# Patient Record
Sex: Female | Born: 1970
Health system: Southern US, Community
[De-identification: ages and names within clinical notes are randomized; demographics above are authoritative.]

## PROBLEM LIST (undated history)

## (undated) DIAGNOSIS — G4730 Sleep apnea, unspecified: Secondary | ICD-10-CM

## (undated) DIAGNOSIS — H9190 Unspecified hearing loss, unspecified ear: Secondary | ICD-10-CM

## (undated) DIAGNOSIS — D649 Anemia, unspecified: Secondary | ICD-10-CM

## (undated) DIAGNOSIS — E119 Type 2 diabetes mellitus without complications: Secondary | ICD-10-CM

## (undated) DIAGNOSIS — F419 Anxiety disorder, unspecified: Secondary | ICD-10-CM

## (undated) DIAGNOSIS — R569 Unspecified convulsions: Secondary | ICD-10-CM

## (undated) DIAGNOSIS — Z9289 Personal history of other medical treatment: Secondary | ICD-10-CM

## (undated) DIAGNOSIS — Z8709 Personal history of other diseases of the respiratory system: Secondary | ICD-10-CM

## (undated) DIAGNOSIS — R0600 Dyspnea, unspecified: Secondary | ICD-10-CM

## (undated) DIAGNOSIS — F329 Major depressive disorder, single episode, unspecified: Secondary | ICD-10-CM

## (undated) DIAGNOSIS — F32A Depression, unspecified: Secondary | ICD-10-CM

## (undated) DIAGNOSIS — Z8639 Personal history of other endocrine, nutritional and metabolic disease: Secondary | ICD-10-CM

## (undated) DIAGNOSIS — Z8659 Personal history of other mental and behavioral disorders: Secondary | ICD-10-CM

## (undated) DIAGNOSIS — Z87898 Personal history of other specified conditions: Secondary | ICD-10-CM

## (undated) HISTORY — DX: Personal history of other mental and behavioral disorders: Z86.59

## (undated) HISTORY — DX: Anxiety disorder, unspecified: F41.9

## (undated) HISTORY — DX: Personal history of other endocrine, nutritional and metabolic disease: Z86.39

## (undated) HISTORY — PX: WISDOM TOOTH EXTRACTION: SHX21

## (undated) HISTORY — DX: Personal history of other specified conditions: Z87.898

## (undated) HISTORY — DX: Personal history of other diseases of the respiratory system: Z87.09

---

## 1999-07-13 ENCOUNTER — Inpatient Hospital Stay (HOSPITAL_COMMUNITY): Admission: AD | Admit: 1999-07-13 | Discharge: 1999-07-13 | Payer: Self-pay | Admitting: Obstetrics and Gynecology

## 1999-09-22 ENCOUNTER — Inpatient Hospital Stay (HOSPITAL_COMMUNITY): Admission: AD | Admit: 1999-09-22 | Discharge: 1999-09-25 | Payer: Self-pay | Admitting: Obstetrics & Gynecology

## 1999-10-19 ENCOUNTER — Other Ambulatory Visit: Admission: RE | Admit: 1999-10-19 | Discharge: 1999-10-19 | Payer: Self-pay | Admitting: Obstetrics and Gynecology

## 2000-10-30 ENCOUNTER — Other Ambulatory Visit: Admission: RE | Admit: 2000-10-30 | Discharge: 2000-10-30 | Payer: Self-pay | Admitting: Obstetrics and Gynecology

## 2001-04-29 ENCOUNTER — Encounter: Admission: RE | Admit: 2001-04-29 | Discharge: 2001-07-28 | Payer: Self-pay | Admitting: Family Medicine

## 2001-11-22 ENCOUNTER — Other Ambulatory Visit: Admission: RE | Admit: 2001-11-22 | Discharge: 2001-11-22 | Payer: Self-pay | Admitting: Obstetrics and Gynecology

## 2002-12-08 ENCOUNTER — Other Ambulatory Visit: Admission: RE | Admit: 2002-12-08 | Discharge: 2002-12-08 | Payer: Self-pay | Admitting: Obstetrics and Gynecology

## 2003-04-07 ENCOUNTER — Ambulatory Visit (HOSPITAL_COMMUNITY): Admission: RE | Admit: 2003-04-07 | Discharge: 2003-04-07 | Payer: Self-pay | Admitting: Obstetrics and Gynecology

## 2003-04-07 IMAGING — US US OB COMP +14 WK
1 series · 13 of 28 positions shown · non-contrast
Comparison: none

CLINICAL DATA: LMP [DATE].  G2 P1.  Assess fetal anatomy.  Possible borderline ventriculomegaly noted on outside ultrasound in [facility].

[Series 1: unknown · 0.20mm/px · 13 of 72 slices shown]
[im 3/72]
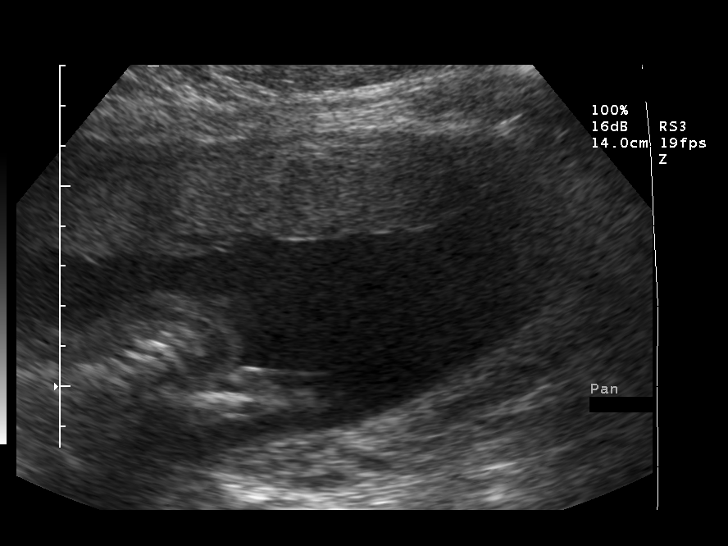
[im 8/72]
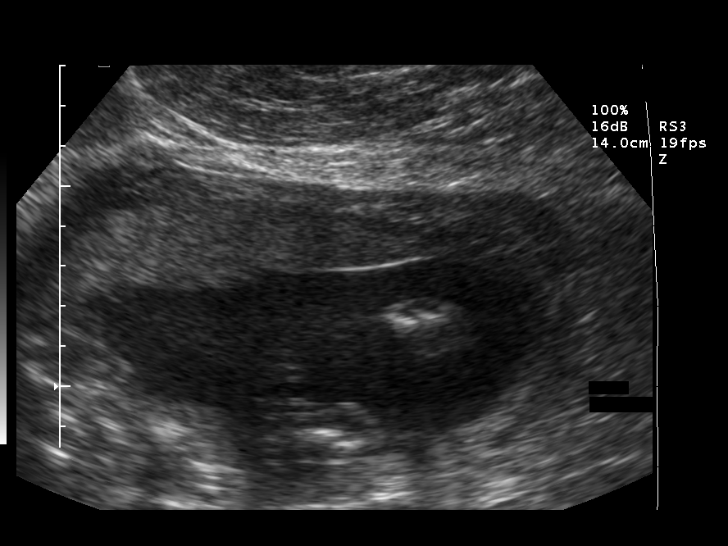
[im 14/72]
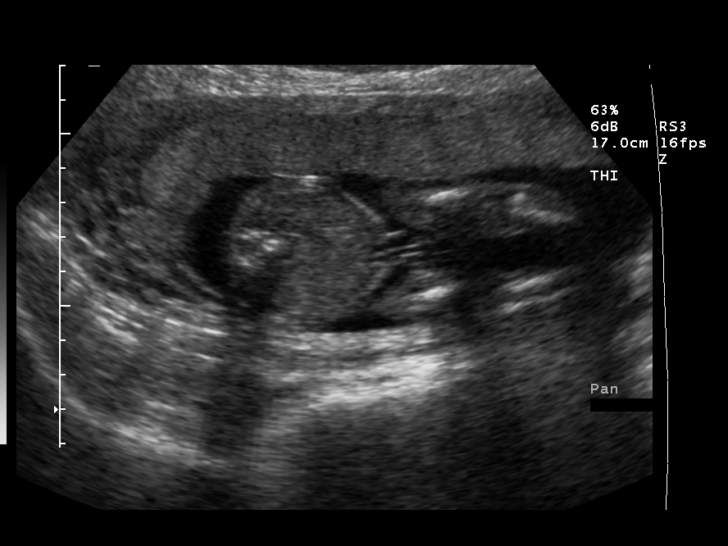
[im 19/72]
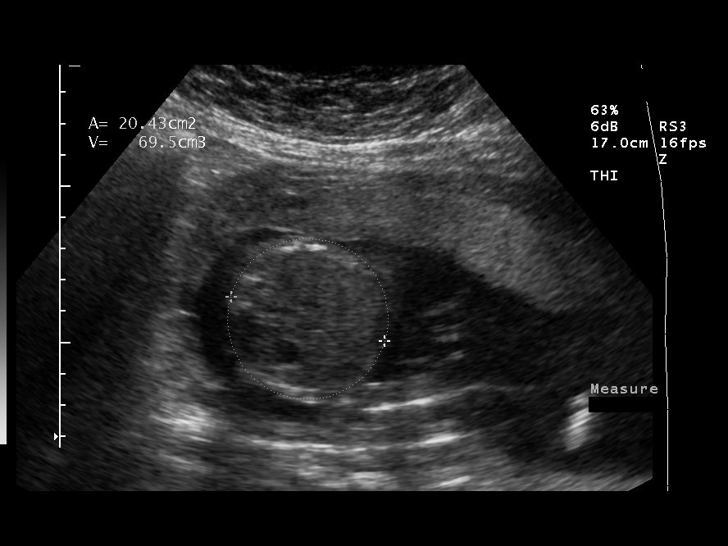
[im 24/72]
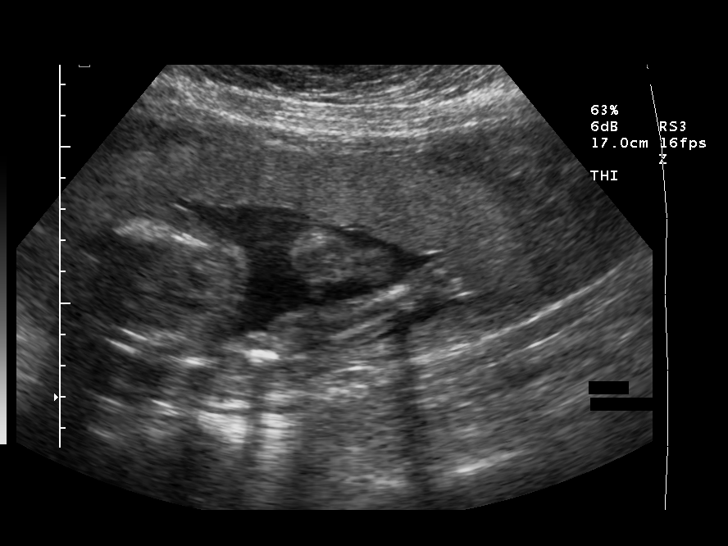
[im 29/72]
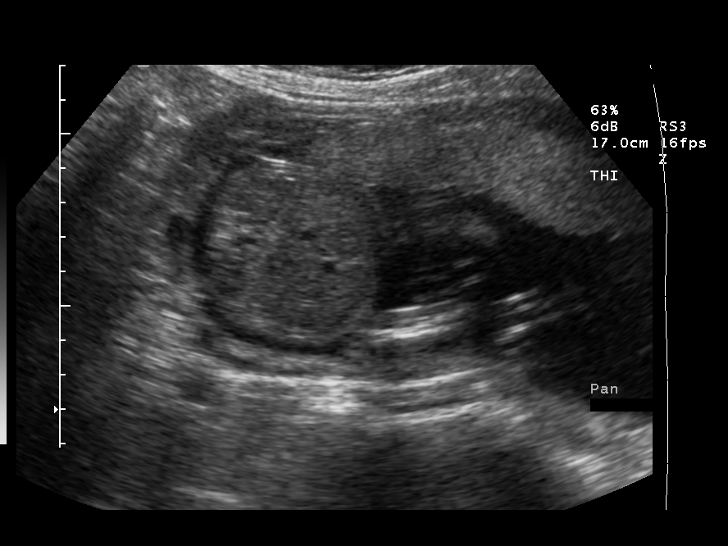
[im 37/72]
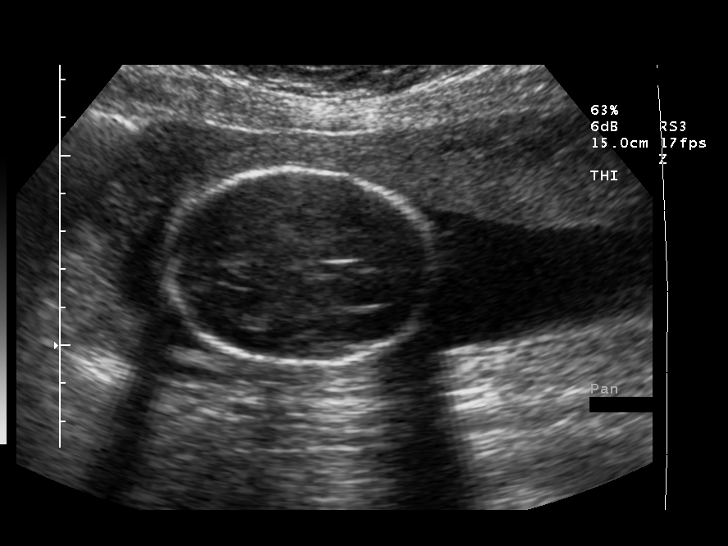
[im 43/72]
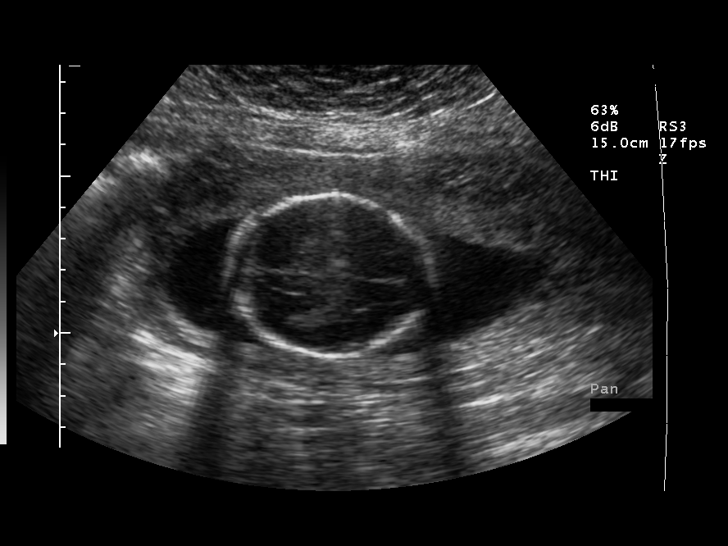
[im 48/72]
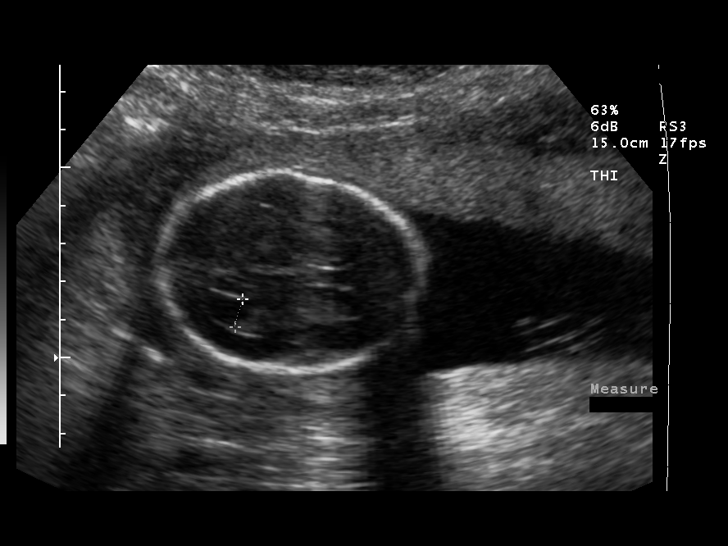
[im 53/72]
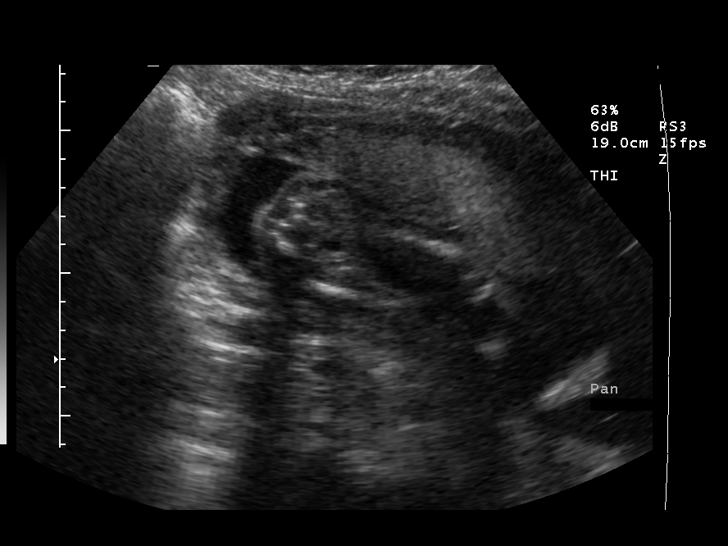
[im 58/72]
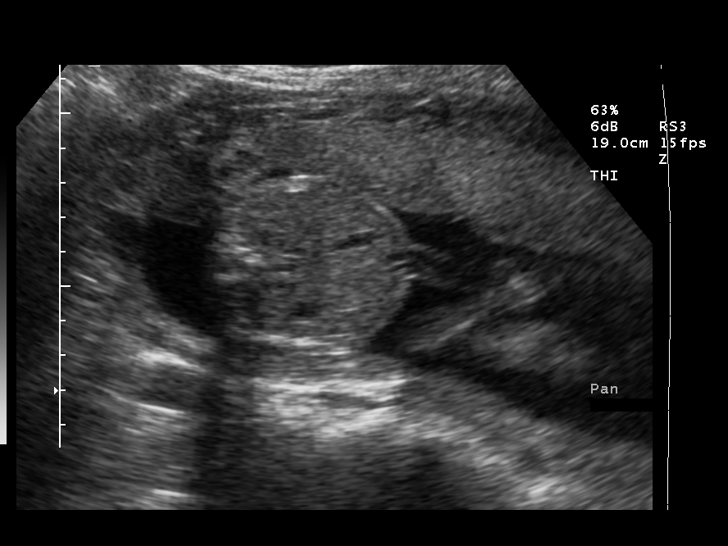
[im 64/72]
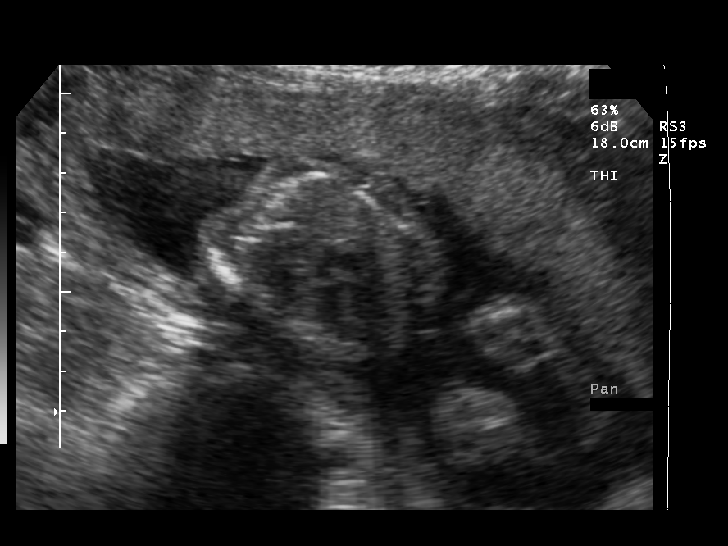
[im 69/72]
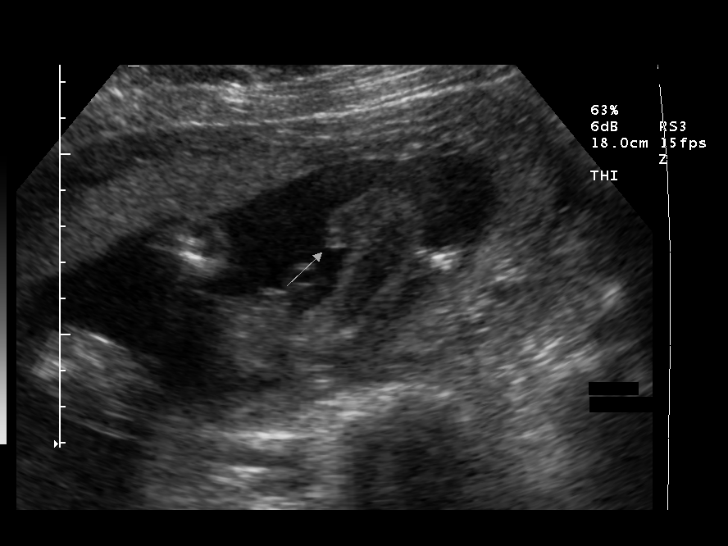

[13 of 28 positions shown; findings below may reference images not displayed]

OBSTETRICAL ULTRASOUND
Number of Fetuses:  1
Heart Rate:  158
Movement:  Yes
Breathing:  No  
Presentation:  Transverse with head on maternal right
Placental Location:  Anterior
Grade:  I
Previa:  No
Amniotic Fluid (Subjective):  Normal 
Amniotic Fluid (Objective):   5.0 cm Vertical pocket 

FETAL BIOMETRY
BPD:  5.1 cm   21 w 4 d
HC:  19.8 cm   22 w 0 d
AC:  16.2 cm   21 w 2 d
FL:  3.6 cm  21 w 3 d

MEAN GA:  21 w 4 d
GA BY LMP:  20 w 2 d (assigned)

FETAL ANATOMY
Lateral Ventricles:    Visualized (8 mm = normal) 
Thalami/CSP:      Visualized 
Posterior Fossa:  Visualized 
Nuchal Region:    N/A
Spine:      Visualized 
4 Chamber Heart on Left:      Visualized 
Stomach on Left:      Visualized 
3 Vessel Cord:    Visualized 
Cord Insertion site:    Visualized 
Kidneys:  Visualized 
Bladder:  Visualized 
Extremities:      Visualized 

ADDITIONAL ANATOMY VISUALIZED:  Upper lip, orbits, profile, diaphragm, 5th digit, aortic arch, and male genitalia

MATERNAL FINDINGS
Cervix: 5.0 cm Transabdominally
IMPRESSION: Single living intrauterine fetus in transverse lie with head on maternal right.  Patient?s assigned gestational age is 20 weeks 2 days by LMP dating.  She measures 21 weeks 4 days today indicating appropriate growth.
Cardiac outflow tracts, heel and ductal arch not seen today.  Otherwise, no anatomic abnormality noted.  The lateral ventricles measure 8 mm today which is within normal limits.

## 2003-04-07 IMAGING — US US OB COMP +14 WK
1 series · 5 of 5 positions shown · non-contrast
Comparison: none

CLINICAL DATA: LMP [DATE].  G2 P1.  Assess fetal anatomy.  Possible borderline ventriculomegaly noted on outside ultrasound in [facility].

[Series 1: unknown · 0.24mm/px · 5 of 5 slices shown]
[im 1/5]
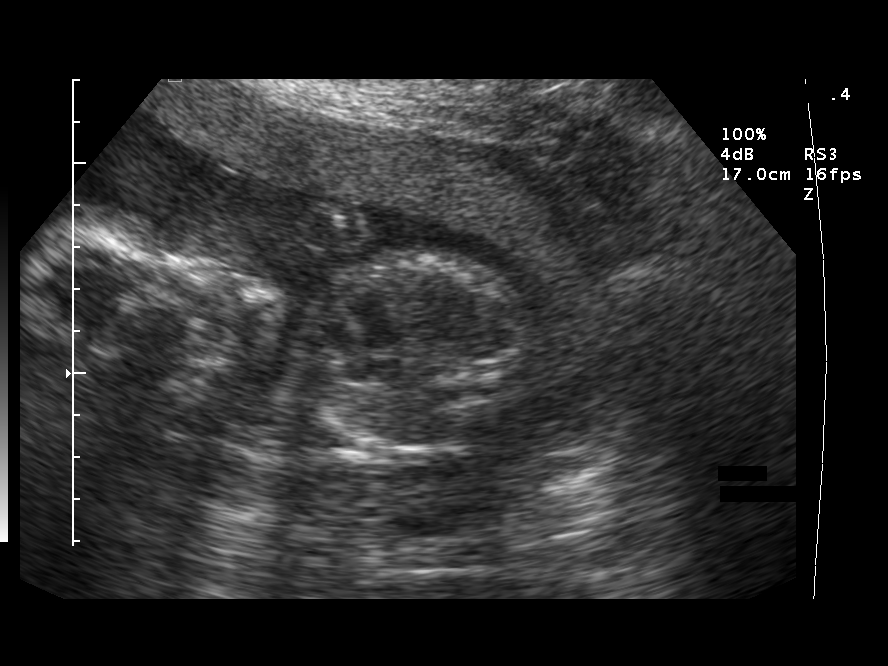
[im 2/5]
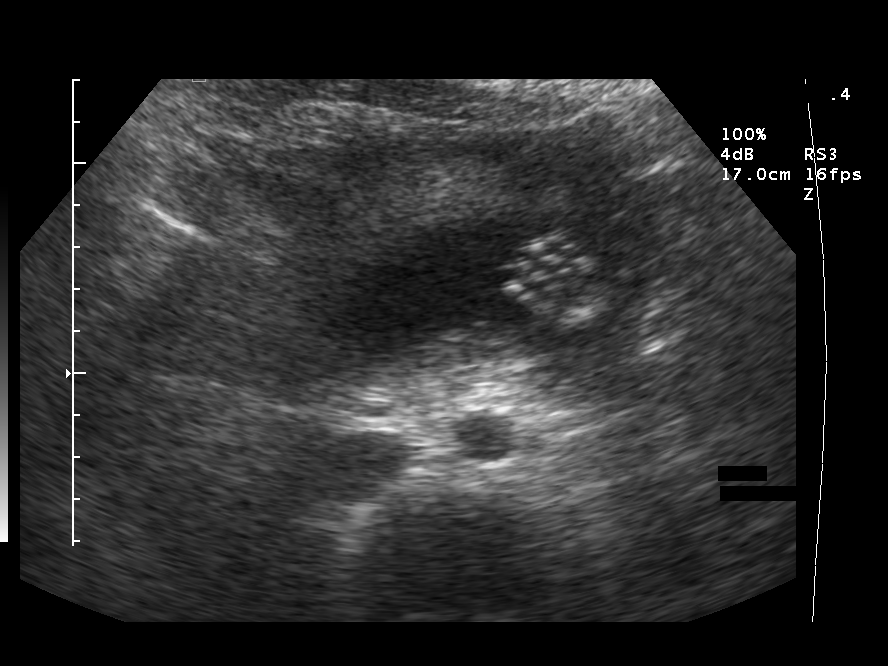
[im 3/5]
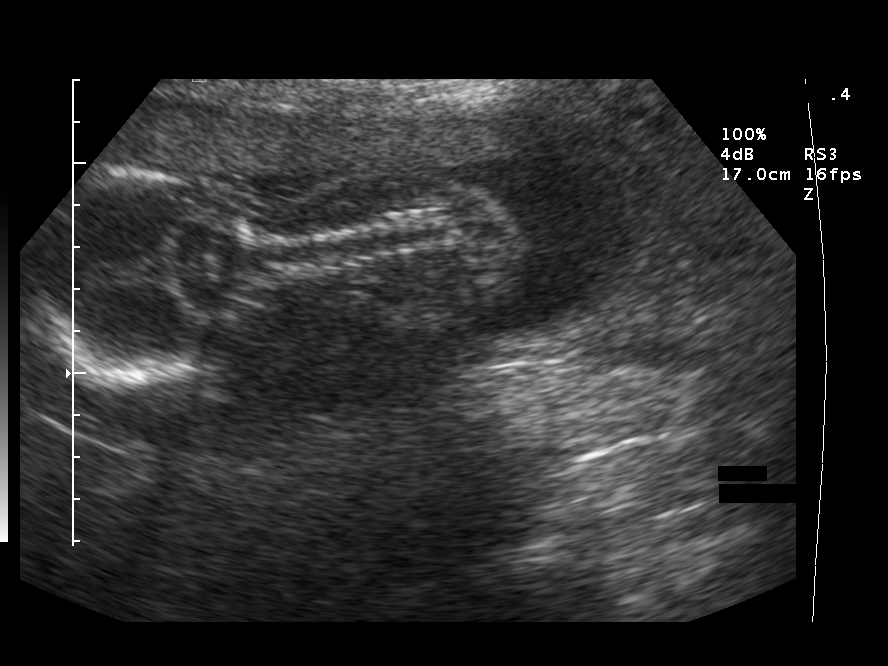
[im 4/5]
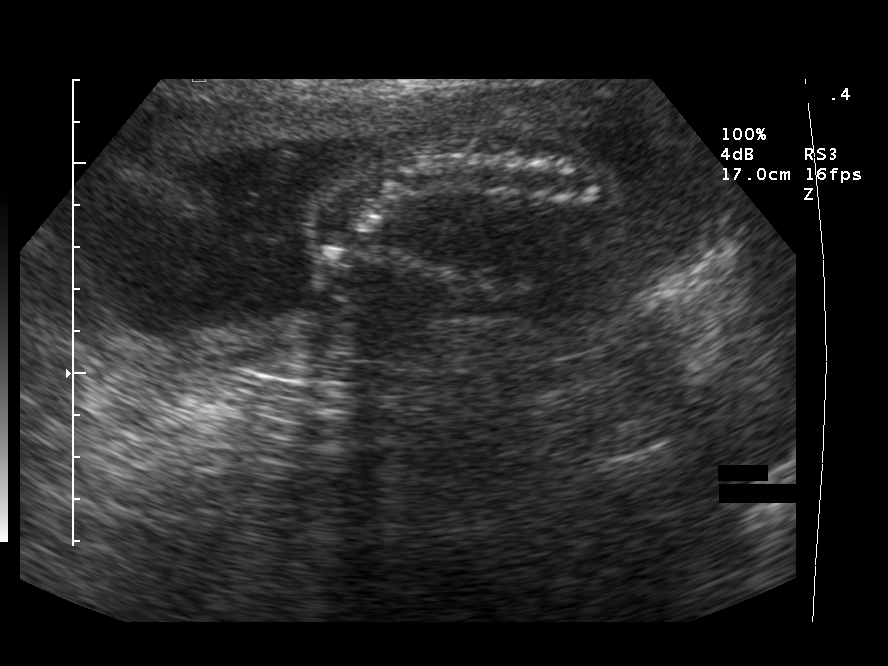
[im 5/5]
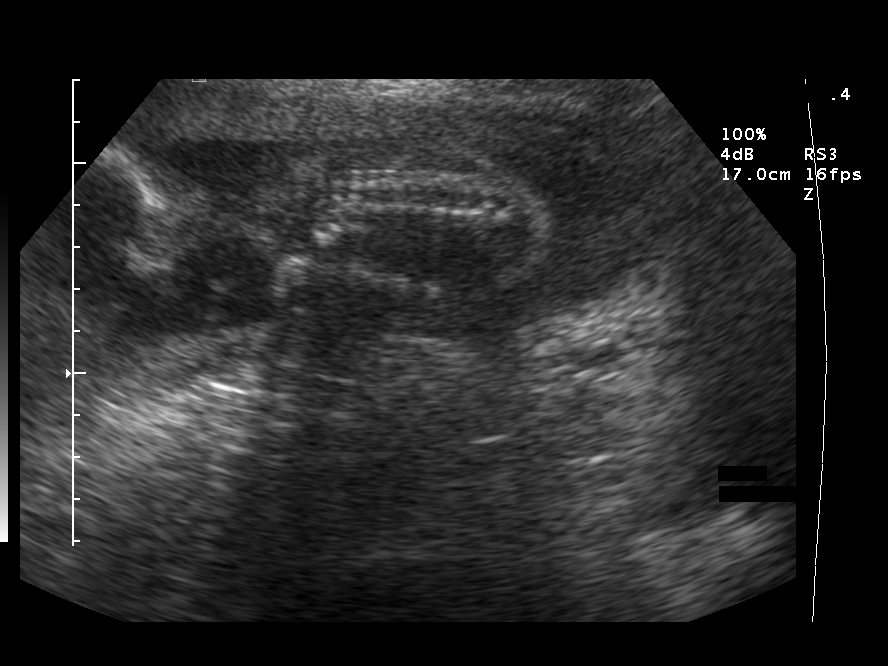

[5 of 5 positions shown; findings below may reference images not displayed]

OBSTETRICAL ULTRASOUND
Number of Fetuses:  1
Heart Rate:  158
Movement:  Yes
Breathing:  No  
Presentation:  Transverse with head on maternal right
Placental Location:  Anterior
Grade:  I
Previa:  No
Amniotic Fluid (Subjective):  Normal 
Amniotic Fluid (Objective):   5.0 cm Vertical pocket 

FETAL BIOMETRY
BPD:  5.1 cm   21 w 4 d
HC:  19.8 cm   22 w 0 d
AC:  16.2 cm   21 w 2 d
FL:  3.6 cm  21 w 3 d

MEAN GA:  21 w 4 d
GA BY LMP:  20 w 2 d (assigned)

FETAL ANATOMY
Lateral Ventricles:    Visualized (8 mm = normal) 
Thalami/CSP:      Visualized 
Posterior Fossa:  Visualized 
Nuchal Region:    N/A
Spine:      Visualized 
4 Chamber Heart on Left:      Visualized 
Stomach on Left:      Visualized 
3 Vessel Cord:    Visualized 
Cord Insertion site:    Visualized 
Kidneys:  Visualized 
Bladder:  Visualized 
Extremities:      Visualized 

ADDITIONAL ANATOMY VISUALIZED:  Upper lip, orbits, profile, diaphragm, 5th digit, aortic arch, and male genitalia

MATERNAL FINDINGS
Cervix: 5.0 cm Transabdominally
IMPRESSION: Single living intrauterine fetus in transverse lie with head on maternal right.  Patient?s assigned gestational age is 20 weeks 2 days by LMP dating.  She measures 21 weeks 4 days today indicating appropriate growth.
Cardiac outflow tracts, heel and ductal arch not seen today.  Otherwise, no anatomic abnormality noted.  The lateral ventricles measure 8 mm today which is within normal limits.

## 2003-06-02 ENCOUNTER — Ambulatory Visit (HOSPITAL_COMMUNITY): Admission: RE | Admit: 2003-06-02 | Discharge: 2003-06-02 | Payer: Self-pay | Admitting: Obstetrics and Gynecology

## 2003-07-30 ENCOUNTER — Emergency Department (HOSPITAL_COMMUNITY): Admission: EM | Admit: 2003-07-30 | Discharge: 2003-07-30 | Payer: Self-pay | Admitting: *Deleted

## 2003-08-14 ENCOUNTER — Inpatient Hospital Stay (HOSPITAL_COMMUNITY): Admission: RE | Admit: 2003-08-14 | Discharge: 2003-08-17 | Payer: Self-pay | Admitting: Obstetrics and Gynecology

## 2004-02-26 ENCOUNTER — Inpatient Hospital Stay (HOSPITAL_COMMUNITY): Admission: AD | Admit: 2004-02-26 | Discharge: 2004-02-26 | Payer: Self-pay | Admitting: Obstetrics & Gynecology

## 2004-04-14 ENCOUNTER — Other Ambulatory Visit: Admission: RE | Admit: 2004-04-14 | Discharge: 2004-04-14 | Payer: Self-pay | Admitting: Obstetrics and Gynecology

## 2004-08-10 ENCOUNTER — Emergency Department (HOSPITAL_COMMUNITY): Admission: EM | Admit: 2004-08-10 | Discharge: 2004-08-10 | Payer: Self-pay | Admitting: Family Medicine

## 2005-04-12 ENCOUNTER — Ambulatory Visit: Payer: Self-pay | Admitting: Family Medicine

## 2005-07-10 ENCOUNTER — Ambulatory Visit: Payer: Self-pay | Admitting: Family Medicine

## 2005-07-24 ENCOUNTER — Ambulatory Visit: Payer: Self-pay | Admitting: Family Medicine

## 2005-08-18 ENCOUNTER — Ambulatory Visit: Payer: Self-pay | Admitting: Family Medicine

## 2006-01-05 ENCOUNTER — Other Ambulatory Visit: Admission: RE | Admit: 2006-01-05 | Discharge: 2006-01-05 | Payer: Self-pay | Admitting: Family Medicine

## 2006-01-05 ENCOUNTER — Encounter: Payer: Self-pay | Admitting: Family Medicine

## 2006-01-05 ENCOUNTER — Ambulatory Visit: Payer: Self-pay | Admitting: Family Medicine

## 2006-01-15 ENCOUNTER — Ambulatory Visit: Payer: Self-pay | Admitting: Family Medicine

## 2006-01-24 ENCOUNTER — Ambulatory Visit: Payer: Self-pay | Admitting: Endocrinology

## 2006-02-16 ENCOUNTER — Ambulatory Visit: Payer: Self-pay | Admitting: Endocrinology

## 2006-03-26 ENCOUNTER — Ambulatory Visit: Payer: Self-pay | Admitting: Family Medicine

## 2006-05-04 ENCOUNTER — Ambulatory Visit: Payer: Self-pay | Admitting: Family Medicine

## 2006-06-20 ENCOUNTER — Ambulatory Visit: Payer: Self-pay | Admitting: Family Medicine

## 2006-11-15 ENCOUNTER — Encounter: Payer: Self-pay | Admitting: Endocrinology

## 2006-11-15 DIAGNOSIS — E162 Hypoglycemia, unspecified: Secondary | ICD-10-CM

## 2007-04-15 ENCOUNTER — Ambulatory Visit: Payer: Self-pay | Admitting: Internal Medicine

## 2007-05-13 ENCOUNTER — Encounter: Payer: Self-pay | Admitting: Family Medicine

## 2007-06-12 ENCOUNTER — Emergency Department (HOSPITAL_COMMUNITY): Admission: EM | Admit: 2007-06-12 | Discharge: 2007-06-12 | Payer: Self-pay | Admitting: Emergency Medicine

## 2007-07-31 ENCOUNTER — Ambulatory Visit: Payer: Self-pay | Admitting: Family Medicine

## 2007-07-31 LAB — CONVERTED CEMR LAB: Rapid Strep: NEGATIVE

## 2008-02-05 ENCOUNTER — Ambulatory Visit: Payer: Self-pay | Admitting: Family Medicine

## 2008-02-05 LAB — CONVERTED CEMR LAB: Rapid Strep: NEGATIVE

## 2008-02-07 ENCOUNTER — Ambulatory Visit: Payer: Self-pay | Admitting: Family Medicine

## 2008-02-07 ENCOUNTER — Encounter: Payer: Self-pay | Admitting: Family Medicine

## 2008-02-07 ENCOUNTER — Other Ambulatory Visit: Admission: RE | Admit: 2008-02-07 | Discharge: 2008-02-07 | Payer: Self-pay | Admitting: Family Medicine

## 2008-02-07 DIAGNOSIS — J309 Allergic rhinitis, unspecified: Secondary | ICD-10-CM | POA: Insufficient documentation

## 2008-02-07 DIAGNOSIS — F419 Anxiety disorder, unspecified: Secondary | ICD-10-CM | POA: Insufficient documentation

## 2008-03-16 ENCOUNTER — Encounter: Admission: RE | Admit: 2008-03-16 | Discharge: 2008-03-16 | Payer: Self-pay | Admitting: Family Medicine

## 2008-03-16 IMAGING — MG MM SCREEN MAMMOGRAM BILATERAL
4 series · 4 of 4 positions shown · non-contrast
Comparison: none

DG SCREEN MAMMOGRAM BILATERAL
Bilateral CC and MLO view(s) were taken.
Technologist: SCHAEFER(SCHAEFER)

DIGITAL SCREENING MAMMOGRAM WITH CAD:
There are scattered fibroglandular densities.  A possible mass is noted in the left breast. 
Possible asymmetry is noted in the right breast.  Spot compression views and possibly sonography 
are recommended for further evaluation.

[R CC]
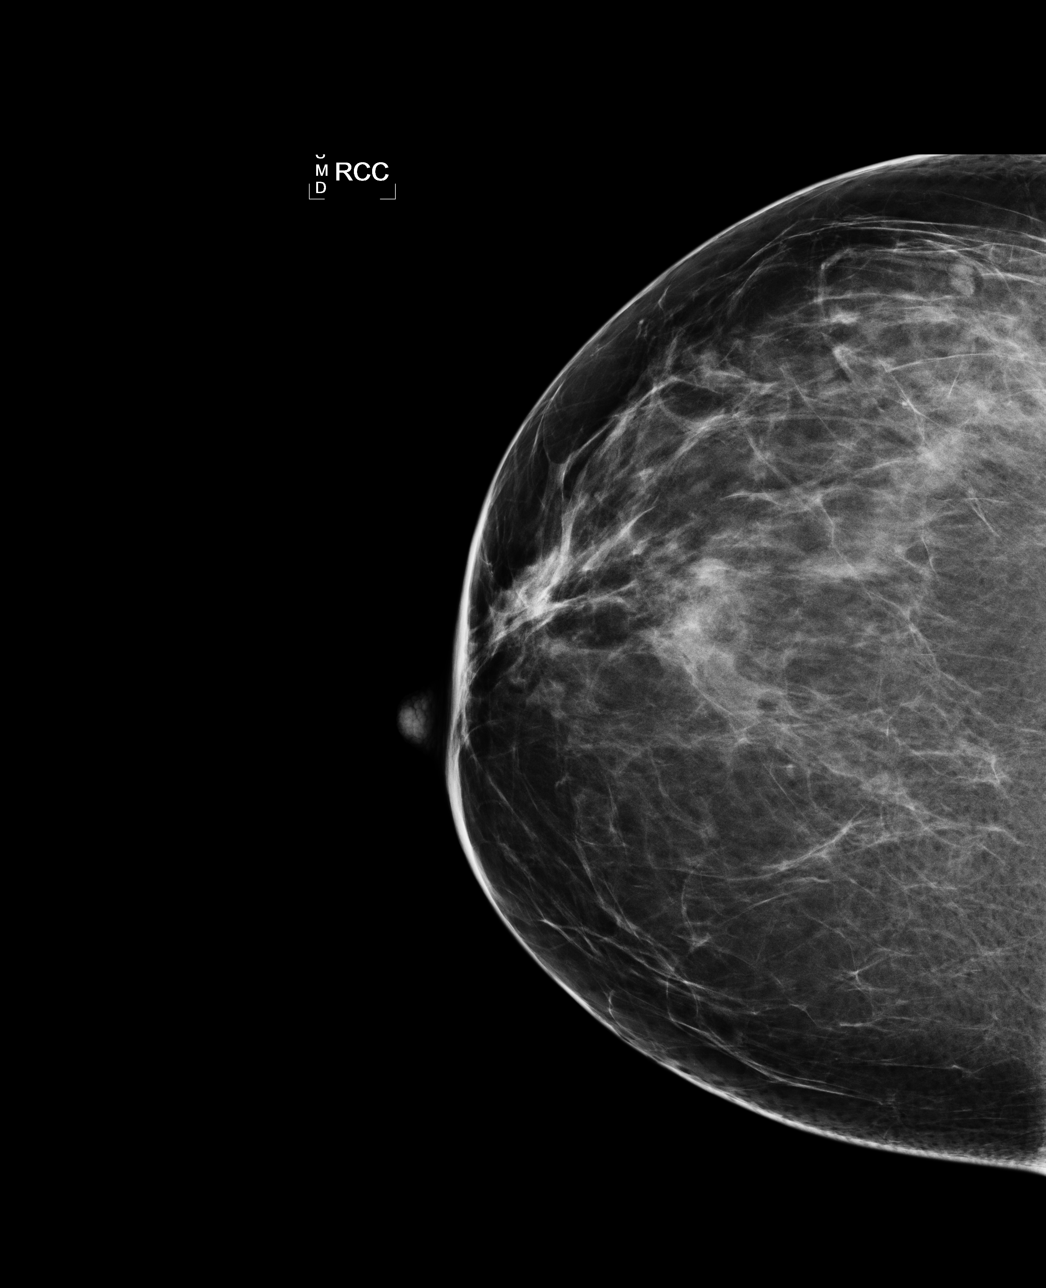

[L CC]
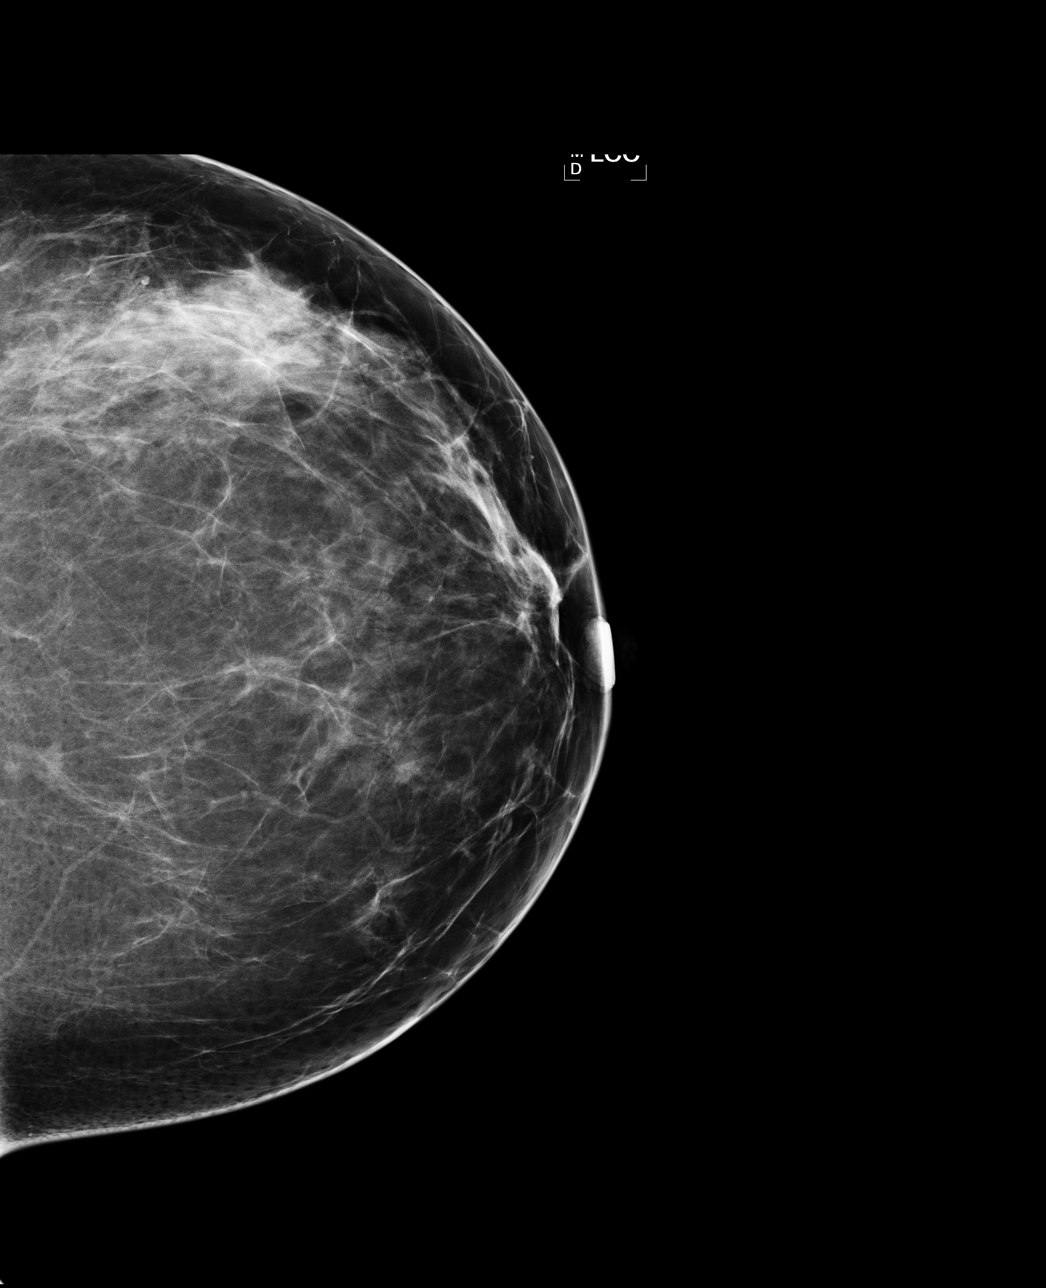

[L MLO]
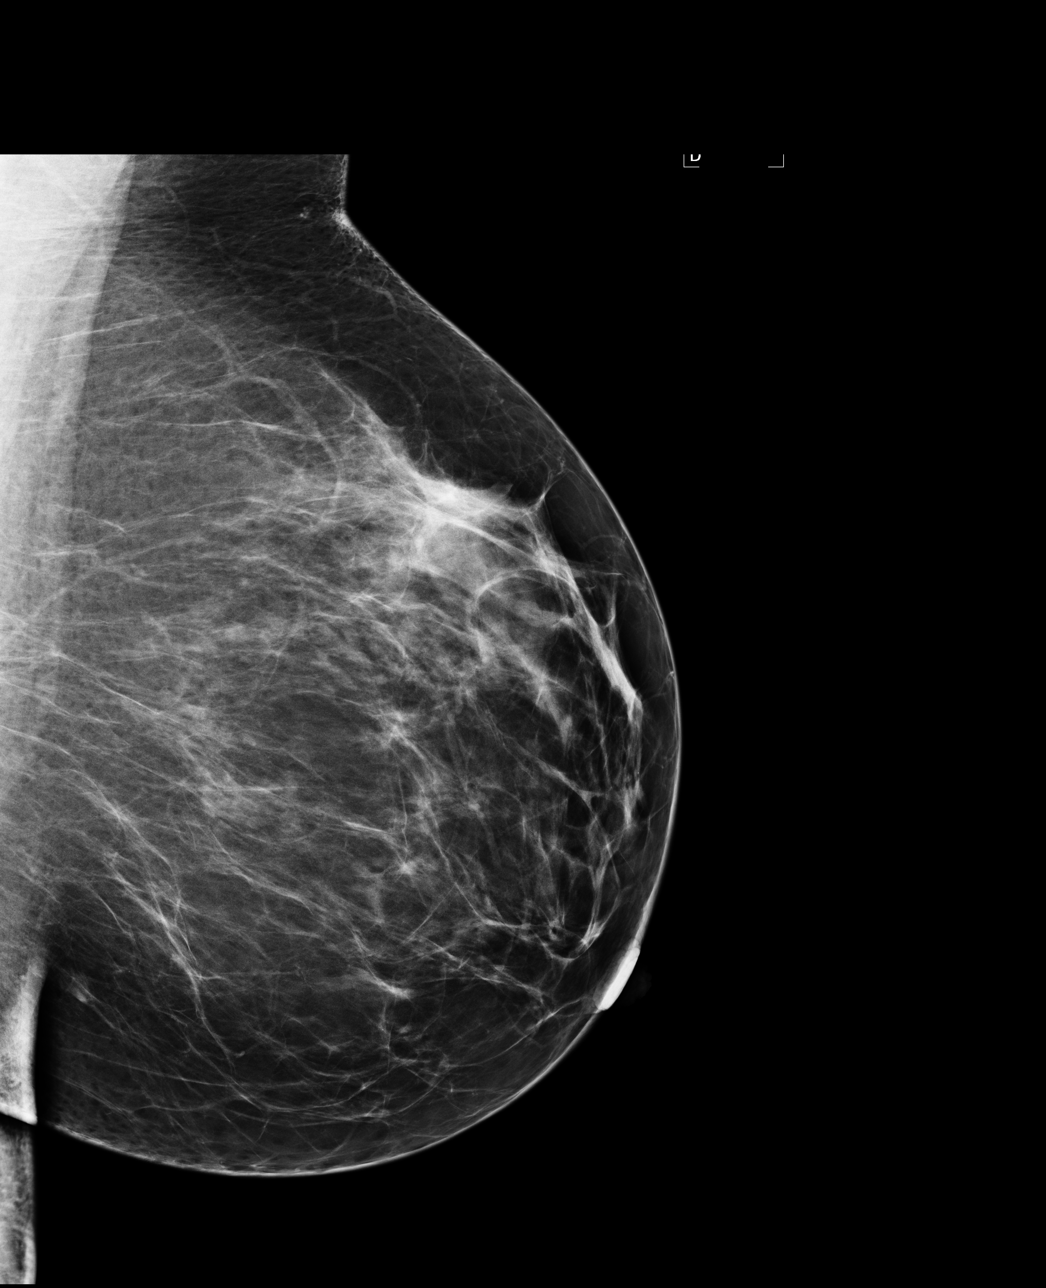

[R MLO]
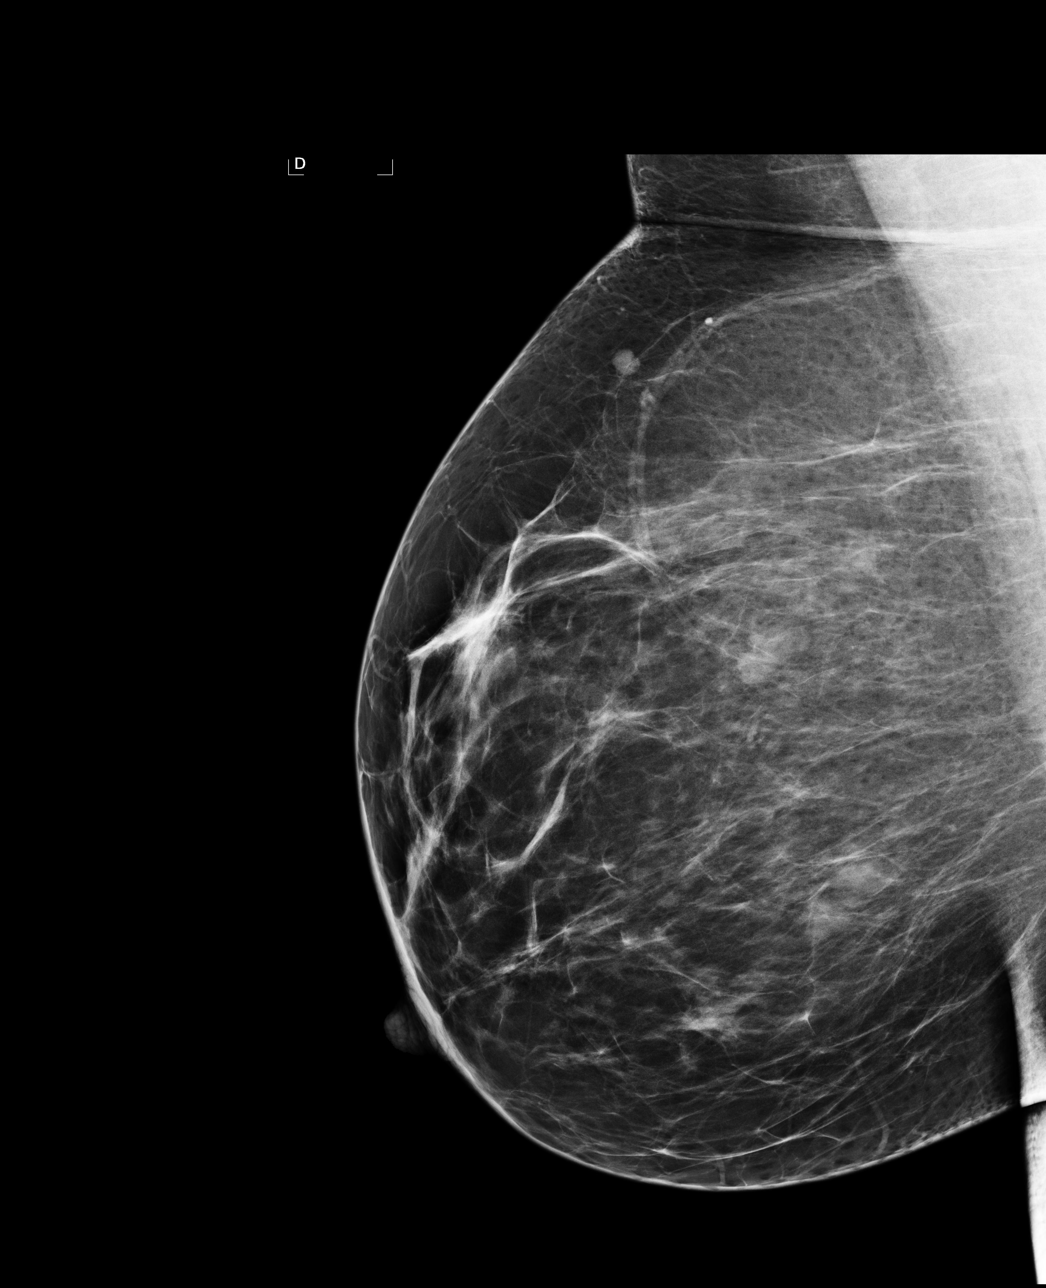

[4 of 4 positions shown; findings below may reference images not displayed]

IMPRESSION: Possible mass, left breast and possible asymmetry right breast.  Additional evaluation is 
indicated.  The patient will be contacted for additional studies and a supplementary report will 
follow.

ASSESSMENT: Need additional imaging evaluation and/or prior mammograms for comparison - BI-RADS 0

Further imaging of both breasts.
ANALYZED BY COMPUTER AIDED DETECTION. , THIS PROCEDURE WAS A DIGITAL MAMMOGRAM.

## 2008-03-18 DIAGNOSIS — R928 Other abnormal and inconclusive findings on diagnostic imaging of breast: Secondary | ICD-10-CM | POA: Insufficient documentation

## 2008-03-23 ENCOUNTER — Encounter: Admission: RE | Admit: 2008-03-23 | Discharge: 2008-03-23 | Payer: Self-pay | Admitting: Family Medicine

## 2008-04-07 ENCOUNTER — Ambulatory Visit: Payer: Self-pay | Admitting: Family Medicine

## 2008-04-07 DIAGNOSIS — E781 Pure hyperglyceridemia: Secondary | ICD-10-CM | POA: Insufficient documentation

## 2008-04-07 DIAGNOSIS — H9190 Unspecified hearing loss, unspecified ear: Secondary | ICD-10-CM | POA: Insufficient documentation

## 2008-05-18 ENCOUNTER — Telehealth (INDEPENDENT_AMBULATORY_CARE_PROVIDER_SITE_OTHER): Payer: Self-pay | Admitting: *Deleted

## 2008-09-17 ENCOUNTER — Encounter: Admission: RE | Admit: 2008-09-17 | Discharge: 2008-09-17 | Payer: Self-pay | Admitting: Family Medicine

## 2008-09-22 ENCOUNTER — Encounter (INDEPENDENT_AMBULATORY_CARE_PROVIDER_SITE_OTHER): Payer: Self-pay | Admitting: *Deleted

## 2008-10-07 ENCOUNTER — Ambulatory Visit: Payer: Self-pay | Admitting: Family Medicine

## 2009-02-17 ENCOUNTER — Telehealth: Payer: Self-pay | Admitting: Family Medicine

## 2009-03-09 ENCOUNTER — Ambulatory Visit: Payer: Self-pay | Admitting: Family Medicine

## 2009-04-06 ENCOUNTER — Ambulatory Visit: Payer: Self-pay | Admitting: Family Medicine

## 2009-12-10 ENCOUNTER — Telehealth: Payer: Self-pay | Admitting: Internal Medicine

## 2010-01-11 ENCOUNTER — Encounter: Payer: Self-pay | Admitting: Family Medicine

## 2010-03-24 ENCOUNTER — Ambulatory Visit: Admit: 2010-03-24 | Payer: Self-pay | Admitting: Family Medicine

## 2010-03-29 ENCOUNTER — Ambulatory Visit
Admission: RE | Admit: 2010-03-29 | Discharge: 2010-03-29 | Payer: Self-pay | Source: Home / Self Care | Attending: Family Medicine | Admitting: Family Medicine

## 2010-04-21 NOTE — Progress Notes (Signed)
Summary: glyset  Phone Note Refill Request Message from:  Patient on December 10, 2009 10:06 AM  Refills Requested: Medication #1:  GLYSET 25 MG TABS 1 and 1/2 tabs by mouth once daily [facility]ses [facility]   Initial call taken by: Melody Comas,  December 10, 2009 10:07 AM    Prescriptions: GLYSET 25 MG TABS (MIGLITOL) 1 and 1/2 tabs by mouth once daily  #45 x 04   Entered by:   Lowella Petties CMA   Authorized by:   Cindee Salt MD   Signed by:   Lowella Petties CMA on 12/10/2009   Method used:   Electronically to        [facility] (retail)       [address]       [place], [place]  [address]       Ph: [phone] or [phone]       Fax: [phone]   RxID:   [id number]

## 2010-04-21 NOTE — Assessment & Plan Note (Signed)
Summary: F/U/alc   Vital Signs:  Patient profile:   40 year old female Height:      66.5 inches Weight:      241.50 pounds BMI:     38.53 Temp:     99.9 degrees F oral Pulse rate:   84 / minute Pulse rhythm:   regular BP sitting:   118 / 82  (left arm) Cuff size:   large  Vitals Entered By: Lewanda Rife LPN (March 29, 2010 4:16 PM) CC: follow-up visit and non productive cough   History of Present Illness: here to f/u for anx and chol and also with non prod cough  is working and caring for kids  husb is traveling a lot   her anxiety is still much better on lexapro can even deal with her husband traveling a lot   no more low sugar spells  is getting some exercise - 30 min per day 4 days per week  bike and walking     wt is up  8lb   her labs from Dr Evlyn Kanner are overall better and trig 207   is still checking sugars -- once per day at different times -- usually runs 118  not fasting  last AIC was 5.7      Allergies: 1)  ! Pcn  Past History:  Past Medical History: Last updated: 04/07/2008 hypoglycemia (syncope) depression/ anx  all rhinitis obesity high trig, low HDL  endo- Dr Evlyn Kanner ENT  Past Surgical History: Last updated: 11/15/2006 Caesarean section  Family History: Last updated: 02/07/2008 father DM Pgrandparents DM GF with prostate cancer   Social History: Last updated: 02/07/2008 non smoker  no alcohol   Review of Systems General:  Denies fatigue, fever, loss of appetite, and malaise. Eyes:  Denies blurring and eye irritation. CV:  Denies chest pain or discomfort, palpitations, and shortness of breath with exertion. Resp:  Denies cough and shortness of breath. GI:  Denies indigestion and nausea. MS:  Denies muscle aches and cramps. Derm:  Denies itching, lesion(s), poor wound healing, and rash. Psych:  Denies anxiety and depression. Endo:  Denies cold intolerance, excessive thirst, excessive urination, and heat intolerance. Heme:   Denies abnormal bruising and bleeding.   Impression & Recommendations:  Problem # 1:  ANXIETY (ICD-300.00) Assessment Improved  much improved with lexapro  continue good lifestyle habits- disc exercise  Her updated medication list for this problem includes:    Lexapro 20 Mg Tabs (Escitalopram oxalate) .Marland Kitchen... Take 1 tablet by mouth once a day  Orders: Prescription Created Electronically [phone])  Problem # 2:  HYPOGLYCEMIA, REACTIVE (ICD-251.2) Assessment: Improved  pt will continue endo f/u and good eating habits  gradually tolerating exercise better   Orders: Prescription Created Electronically [phone])  Complete Medication List: 1)  Onetouch Test Strp (Glucose blood) .... Check blood sugar once daily and as needed 2)  Glyset 25 Mg Tabs (Miglitol) .Marland Kitchen.. 1 by mouth two times a day 3)  Lovaza 1 Gm Caps (Omega-3-acid ethyl esters) .... Four tabs by mouth daily 4)  Lexapro 20 Mg Tabs (Escitalopram oxalate) .... Take 1 tablet by mouth once a day 5)  Trilipix 135 Mg Cpdr (Choline fenofibrate) .Marland Kitchen.. 1 by mouth once daily  Patient Instructions: 1)  think about getting  back to weight watchers  2)  up exercise to 5 days per week if you can  3)  no change in medicines  4)  check sugar MWF am fasting  5)  T, TH, sat ,  sun  check 2 hours after a meal  6)  also check sugar any time you feel like it is low and after exercise  7)  no change in medicine  Prescriptions: TRILIPIX 135 MG CPDR (CHOLINE FENOFIBRATE) 1 by mouth once daily  #30 x 11   Entered and Authorized by:   Judith Part MD   Signed by:   Judith Part MD on 03/29/2010   Method used:   [facility]        [facility] (retail)       [address]       [place], [place]  [address]       Ph: [phone] or [phone]       Fax: [phone]   RxID:   [id number] LEXAPRO 20 MG TABS (ESCITALOPRAM OXALATE) Take 1 tablet by mouth once a day  #30 x 11   Entered and Authorized by:   Judith Part MD   Signed by:   Judith Part MD on 03/29/2010   Method used:   Elect[facility]ically to        [facility] (retail)       [address]       [place], [place]  [address]       Ph: [phone] or [phone]       Fax: [phone]   RxID:   [id number] LOVAZA 1 GM CAPS (OMEGA-3-ACID ETHYL ESTERS) four tabs by mouth daily  #120 x 11   Entered and Authorized by:   Judith Part MD   Signed by:   Judith Part MD on 03/29/2010   Method used:   Electronically to        [facility] (retail)       [address]       [place], [place]  [address]       Ph: [phone] or [phone]       Fax: [phone]   RxID:   [id number] GLYSET 25 MG TABS (MIGLITOL) 1 by mouth two times a day  #60 x 11   Entered and Authorized by:   Judith Part MD   Signed by:   Judith Part MD on 03/29/2010   Method used:   Electronically to        [facility] (retail)       [address]       [place], [place]  [address]       Ph: [phone] or [phone]       Fax: [phone]   RxID:   [id number] ONETOUCH TEST   STRP (GLUCOSE BLOOD) Check blood sugar once daily and as needed  #100 x 3   Entered and Authorized by:   Judith Part MD   Signed by:   Judith Part MD on 03/29/2010   Method used:   [facility]        [facility] ([facility]ail)       [address]       [place], [place]  [address]       Ph: [phone] or [phone]       Fax: [phone]   RxID:   [id number]    Orders Added: 1)  Est. Patient Level III [34196] 2)  Prescription Created Electronically [phone]    Current Allergies (reviewed today): ! PCN

## 2010-04-21 NOTE — Assessment & Plan Note (Signed)
Summary: CONGESTION,CHILLS/CLE   Vital Signs:  Patient profile:   40 year old female Weight:      233 pounds Temp:     98.4 degrees F oral Pulse rate:   64 / minute Pulse rhythm:   regular BP sitting:   112 / 80  (left arm) Cuff size:   regular  Vitals Entered By: Lowella Petties CMA (April 06, 2009 12:24 PM) CC: Congestion and cough   History of Present Illness: symptoms started 3 days ago -- has been in bed  has not had fever  son and husband have the flu =tested positive   is having chills and aches -- but taken temp and not high   congestion and cough  some headaches -- all over head and pounding  neck is sore and aches all over  no sore throat  no appetite or energy  cough is dry and hacking  L ear hurts a little   no n/v/d   is not taking any meds over the counter     Allergies: 1)  ! Pcn  Past History:  Past Medical History: Last updated: 04/07/2008 hypoglycemia (syncope) depression/ anx  all rhinitis obesity high trig, low HDL  endo- Dr Evlyn Kanner ENT  Past Surgical History: Last updated: 11/15/2006 Caesarean section  Family History: Last updated: 02/07/2008 father DM Pgrandparents DM GF with prostate cancer   Social History: Last updated: 02/07/2008 non smoker  no alcohol   Review of Systems General:  Complains of chills, fatigue, loss of appetite, and malaise. Eyes:  Complains of eye irritation; denies discharge. ENT:  Complains of earache, nasal congestion, and postnasal drainage; denies ear discharge, sinus pressure, and sore throat. CV:  Denies chest pain or discomfort and palpitations. Resp:  Complains of cough; denies pleuritic, shortness of breath, sputum productive, and wheezing. GI:  Denies diarrhea, nausea, and vomiting. Derm:  Denies rash.  Physical Exam  General:  overwt/ fatigued appearing  Head:  normocephalic, atraumatic, and no abnormalities observed.  no sinus tenderness  Eyes:  vision grossly intact, pupils  equal, pupils round, pupils reactive to light, and no injection.   Ears:  R ear normal and L ear normal.   Nose:  nares are congested and injected with clear rhinorrhea  Mouth:  pharynx pink and moist, no erythema, and no exudates.   Neck:  No deformities, masses, or tenderness noted. supple and nl rom  Chest Wall:  No deformities, masses, or tenderness noted. Lungs:  Normal respiratory effort, chest expands symmetrically. Lungs are clear to auscultation, no crackles or wheezes. Heart:  Normal rate and regular rhythm. S1 and S2 normal without gallop, murmur, click, rub or other extra sounds. Abdomen:  soft and non-tender.   Skin:  Intact without suspicious lesions or rashes Cervical Nodes:  No lymphadenopathy noted Psych:  normal affect, talkative and pleasant    Impression & Recommendations:  Problem # 1:  INFLUENZA, WITH RESPIRATORY SYMPTOMS (ICD-487.1) Assessment New with chills/ aches - and close family exp is out of window for tamiflu- 3 1/2 days  recommend sympt care- see pt instructions   guifen- cod cough med/tylenol as needed  update if worse or not starting to imp 4-5 d  Complete Medication List: 1)  Onetouch Test Strp (Glucose blood) .... Check blood sugar daily as directed 2)  Glyset 25 Mg Tabs (Miglitol) .Marland Kitchen.. 1 and 1/2 tabs by mouth once daily 3)  Lovaza 1 Gm Caps (Omega-3-acid ethyl esters) .... Four tabs by mouth daily 4)  Lexapro  20 Mg Tabs (Escitalopram oxalate) .... Take 1 tablet by mouth once a day 5)  Triplix 135 Mg  .... One tab by mouth  every morning 6)  Guaifenesin-codeine 100-10 Mg/42ml Liqd (Guaifenesin-codeine) .Marland Kitchen.. 1-2 teaspoons up to every 4-6 hours as needed cough - watch for sedation  Patient Instructions: 1)  drink lots of fluids and rest  2)  tylenol every 4 hours (2 ES) for aches and chills  3)  take cough medicine with caution of sedation 4)  update me if not starting to improve in3-4 days or if worse  Prescriptions: GUAIFENESIN-CODEINE 100-10  MG/5ML LIQD (GUAIFENESIN-CODEINE) 1-2 teaspoons up to every 4-6 hours as needed cough - watch for sedation  #120cc x 0   Entered and Authorized by:   Judith Part MD   Signed by:   Judith Part MD on 04/06/2009   Method used:   Print then Give to Patient   RxID:   [id number]   Prior Medications (reviewed today): ONETOUCH TEST   STRP (GLUCOSE BLOOD) Check blood sugar daily as directed GLYSET 25 MG TABS (MIGLITOL) 1 and 1/2 tabs by mouth once daily LOVAZA 1 GM CAPS (OMEGA-3-ACID ETHYL ESTERS) four tabs by mouth daily LEXAPRO 20 MG TABS (ESCITALOPRAM OXALATE) Take 1 tablet by mouth once a day TRIPLIX 135 MG () one tab by mouth  every morning Current Allergies: ! PCN

## 2010-08-05 NOTE — Op Note (Signed)
[facility]  Patient:    Gail [name], Gail [name]                      MRN: [id number] Proc. Date: 09/22/99 Adm. Date:  [id number] Attending:  Conley Simmonds A                           Operative Report  PREOPERATIVE DIAGNOSES:       1. Intrauterine pregnancy at 40+ weeks                                  gestation.                               2. Fetal macrosomia.  POSTOPERATIVE DIAGNOSES:      1. Intrauterine pregnancy at 40+ weeks                                  gestation.                               2. Fetal macrosomia.                               3. Meconium stained amniotic fluid.  OPERATION:                    Primary low segment transverse cesarean section                               with vacuum assisted delivery of the vertex.  SURGEON:                      Brook A. Edward Jolly, M.D.  ASSISTANT:                    Gerlene Burdock D. Arlyce Dice, M.D.  ANESTHESIA:                   Spinal.  ESTIMATED BLOOD LOSS:         700 cc.  IV FLUIDS:                    2500 ringers lactate  URINE OUTPUT:                 150 cc.  COMPLICATIONS:                None.  INDICATIONS: The patient is a 40 year old, gravida 1, para 0, at 40+[redacted] weeks gestation, who by obstetric ultrasound had a fetus with an estimated weight of 3954 g on September 07, 1999. The patient presented to the office for her routine visit on September 22, 1999, at which time she continued to be measuring size greater than dates. The patient reported decreased fetal movement over the last day. Her cervix was checked and found to be closed, long, and thick with the presenting part high in the pelvis. A nonstress test was noted to be nonreactive, and a biophysical profile documented a score of 4/8. The amniotic fluid was 16 cm.  The patient was sent over to the [facility] for a contractions stress test, which was found to be negative. At this time, the patient was noted to have a reactive fetal heart rate  tracing.  A discussion was held with the patient regarding her options for care, which included primary cesarean section versus induction of labor for suspected fetal macrosomia. After discussing the risks and benefits, the patient chose to proceed with a cesarean delivery.  OPERATIVE FINDINGS: A viable vigorous female infant was born at 13 in the vertex presentation. The Apgars were 8 at one minute and 9 at five minutes. The weight was 9 pounds and 5 ounces. Moderate meconium stained amniotic fluid was appreciated and none was noted below the infants vocal cords. The placenta had a normal insertion of a three-vessel cord and it was intact. The tubes, ovaries, and uterus were noted to be normal.  DESCRIPTION OF PROCEDURE: With an IV in place, the patient was taken to the operating room after she was properly identified. The patient previously received an IV. The patient received a spinal anesthesia and she was then placed in the supine position. The patients abdomen was sterilely prepped and a Foley catheter was sterilely placed inside the bladder. The patient was then sterilely draped.  After adequate anesthesia was assured, a Pfannenstiel incision was made sharply with a scalpel. This was carried down to the fascia using sharp dissection and cautery with the monopolar cautery instrument. The incision in the fascia was extended bilaterally with the Mayo scissors and the rectus muscle was dissected off of the overlying fascia using sharp dissection superiorly and inferiorly. The parietoperitoneum was grasped and was incised sharply with Metzenbaum scissors and digital examination confirmed the absence of adhesions in the peritoneal cavity. The incision was extended superiorly and inferiorly. The lower uterine segment was exposed. The bladder flap was created with a Metzenbaum scissors. The uterine incision was then created transversely with a scalpel, and the incision was extended  bilaterally in an upward fashion with a bandage scissors.  A hand was inserted through the incision for delivery of the fetal vertex. A vacuum was used to assist delivery of the vertex. The infants mouth and nose were then suctioned with suction and the remainder of the infant was delivered. The cord was doubly clamped and cut and the infant was carried over to the waiting pediatrician for evaluation. There were no signs of birth abnormalities.  The patient received cefotetan 2 g IV, and cord blood was obtained. The placenta was then manually extracted and a moistened lap pad was used to remove any remaining membranes from within the uterine cavity.  The uterine incision was then closed with a running locked suture of #1 chromic. A simple through and through suture and a figure-of-eight suture were used along the left upper margin of the incision to create good hemostasis. The uterus was then returned to the peritoneal cavity which was irrigated and suctioned of the remaining fluid. The incision demonstrated excellent hemostasis. The fascia was then closed using a running suture of 0 Vicryl. The subcutaneous tissue was irrigated and small bleeding vessels were cauterized with the monopolar instrument. The skin was then closed with staples and a sterile bandage was placed over the incision. The uterus was expressed of any remaining clots. The patient was escorted to the recovery room in stable condition. There were no complications to the procedure. Needle, instrument, and lap counts were correct. DD:  09/22/99 TD:  09/23/99 Job: [id number]

## 2010-08-05 NOTE — Consult Note (Signed)
[facility]                            ENDOCRINOLOGY CONSULTATION   ALLICE, GARRO                      MRN:          [id number]  DATE:01/24/2006                            DOB:          1970/11/11    REFERRING PHYSICIAN:  Marne A. Tower, MD   REASON FOR REFERRAL:  Hypoglycemia.   HISTORY OF PRESENT ILLNESS:  40 year old woman who about 5 years ago had a  syncopal episode at a gym. She states she was attended to by paramedics and  they found her glucose 14. She states she was 3 months postprandial at that  time. She did not have any further work up at that time. She states she  still occasionally has several minute episodes of slight tremor of her hands  and associated nausea and diaphoresis. She states her glucose's when she has  checked them have ranged from 26 to 200 (the 200 was during a 2005  pregnancy). She states however she is able to go all night without eating  but she eats about six meals a day.   PAST MEDICAL HISTORY:  No other medical problems. She takes no medications.   SOCIAL HISTORY:  She is married. She works as a Pharmacist, community.   FAMILY HISTORY:  Positive for diabetes in her father.   REVIEW OF SYSTEMS:  She states she consistently gains 5 to 10 pounds every 6  months. She has slight diarrhea. She denies numbness.   PHYSICAL EXAMINATION:  VITAL SIGNS: Blood pressure 138/80, heart rate is 88,  temperature is 97.2. Weight is 224.  GENERAL:  No distress.  SKIN: No rash. Not diaphoretic.  HEENT: No proptosis, no periorbital swelling. Pharynx is normal.  NECK: Supple, no goiter.  CHEST: Clear to auscultation.  CARDIOVASCULAR: There is no JVD, no edema. Regular rate and rhythm, no  murmur.  NEUROLOGIC: Alert and oriented, does not appear anxious nor depressed.  Cranial nerves appear to be intact and she moves all four extremities with  normal muscle bulk and strength.   LABORATORY STUDIES FORWARDED BY DR. Milinda Antis:  On  04/12/05 a fasting glucose is  102 and A1C is 5.4. TSH 0.61.   IMPRESSION:  1. Apparent history of reactive hypoglycemia.  2. The A1C done by Dr. Milinda Antis earlier this year is elevated and this      therefore excludes fasting hypoglycemia with high probability. However,      fasting hypoglycemia is a much more important disease entity so I would      like to exclude it with greater certainty.  3. Symptom complex of tremor and nausea which may not be related to her      glucose.   PLAN:  1. I have told her the first order of business is to exclude fasting      hypoglycemia. She will have an appointment in the next few weeks in      which she comes to our office first thing in the morning fasting, and      then spends the day here, observing her for symptoms and checking a  glucose periodically. Will also check insulin and C-peptide levels at      that time.  2. I have told her that reactive hypoglycemia is clinically significant      only in that it is a risk factor for diabetes.    ______________________________  Cleophas Dunker Everardo All, MD    SAE/MedQ  DD: 01/25/2006  DT: 01/25/2006  Job #: [id number]   cc:   Marne A. Milinda Antis, MD

## 2010-08-05 NOTE — Discharge Summary (Signed)
NAME:  Gail [name], Gail [name]                         ACCOUNT NO.:  [id number]   MEDICAL RECORD NO.:  [id number]                   PATIENT TYPE:  INP   LOCATION:  9148                                 FACILITY:  [facility]   PHYSICIAN:  Ilda Mori, M.D.                DATE OF BIRTH:  Oct 09, 1970   DATE OF ADMISSION:  08/14/2003  DATE OF DISCHARGE:  08/17/2003                                 DISCHARGE SUMMARY   FINAL DIAGNOSES:  1. Intrauterine pregnancy at 38-5/[redacted] weeks gestation.  2. History of prior cesarean section desires repeat cesarean section.   PROCEDURE:  Repeat low transverse cesarean section.   SURGEON:  Dr. Conley Simmonds   ASSISTANT:  Dr. Miguel Aschoff   COMPLICATIONS:  None.   This 40 year old G 2, P 1 presents at 38-5/[redacted] weeks gestation for repeat  cesarean section.  The patient had a history of a cesarean section with her  first pregnancy for fetal macrosomia and has expressed her desires for a  repeat cesarean section with this pregnancy.  The patient's antepartum  course had been complicated by a equivocal rubella immune status.  The  patient was Rh negative.  Did receive her RhoGAM at 28 weeks, otherwise  antepartum course had been really uncomplicated.  She did have a negative  group B strep culture performed in the office.  She was taken to the  operating room on Aug 14, 2003 by Dr. Conley Simmonds where a repeat low  transverse cesarean section was performed with the delivery of a 9 pound 0  ounce female infant with Apgars of 8 and 9.  Delivery went without  complication.  The patient's postoperative course has been benign without  significant fevers.  She was all ready for discharge on postoperative day  #2.  She was sent home on a regular diet, told to decrease activities, told  to continue prenatal vitamins, was given her RhoGAM shot before discharge.  I do not see where she got her rubella vaccination before discharge.  She  was told to continue her vitamins.  Gave Tylox one to  two every 4 hours as  needed for pain.  Told she could use over-the-counter Motrin if needed.  Was  to follow up in the office in 4 weeks.   LABORATORY DATA ON DISCHARGE:  She had a hemoglobin of 10.9, white blood  cell count of 9.3.     [name], P.A.-C.                Ilda Mori, M.D.    MB/MEDQ  D:  08/31/2003  [name]:  08/31/2003  Job:  [id number]

## 2010-08-05 NOTE — Discharge Summary (Signed)
[facility]  Patient:    Gail [name], Gail [name]                      MRN: [id number] Adm. Date:  [id number] Disc. Date: [id number] Attending:  Conley Simmonds A Dictator:   [name], P.A.                           Discharge Summary  FINAL DIAGNOSIS:              Intrauterine pregnancy at 40+ weeks gestation,                               fetal macrosomia, meconium stained amniotic                               fluid.  PROCEDURE:                    Primary low segment transverse cesarean section                               with a vacuum assisted delivery of the vertex.  SURGEON:                      Brook A. Edward Jolly, M.D.  ASSISTANT:                    Gerlene Burdock D. Arlyce Dice, M.D.  COMPLICATIONS:                None.  HOSPITAL COURSE:              This 40 year old g1, p0, presents at 40+ weeks gestation for a primary low transverse cesarean section.  The patient had had an ultrasound with estimated fetal weight performed in the office on June 20 which showed an estimated fetal weight of 3954 grams.  The patient was seen for a visit on July 5, when she continued to measure size greater than date. The patient had reported decreased fetal movement over the last day.  Her cervix was checked and found to be closed, long, and thick.  A nonstress test was performed and was nonreactive, and a BPP showed a documented score of 4/8. AFI was 16 cm, at which time patient was sent over to the [facility] for contraction stress test which was found to be negative.  At this time, the patient was noted to have a reactive fetal heart tracing, but a discussion was held with the patient regarding her options for care, and the patient decided to proceed with a cesarean section.  The patient was taken to the operating room by Dr. Conley Simmonds where a primary low segment transverse cesarean section was performed with the delivery of a 9 pounds 5 ounce female infant with  Apgars of 8 and 9.  Delivery went without complications.  The patients postoperative course was benign without significant fever.  The patient was treated for some postoperative anemia with some FeSO4 325 mg 1 [name].i.d. The patient was felt ready for discharge by postoperative day #3 and was sent home on a regular diet, told to decrease activities, was given Tylox #20 1-2 every four hours as needed for pain, told to continue prenatal vitamins  FeSO4. Mother did not need RhoGAM after delivery.  Baby was RH negative as well.  LABORATORIES ON DISCHARGE:    The patients hemoglobin was 7.7, ______ 11.5. DD:  11/03/99 TD:  11/05/99 Job: [id number] XL/KG401

## 2010-08-05 NOTE — Op Note (Signed)
NAME:  Gail Gail [name], Gail Gail [name]                         ACCOUNT NO.:  [id number]   MEDICAL RECORD NO.:  [id number]                   PATIENT TYPE:  INP   LOCATION:  9148                                 FACILITY:  [facility]   PHYSICIAN:  Randye Lobo, M.D.                DATE OF BIRTH:  23-Feb-1971   DATE OF PROCEDURE:  08/14/2003  DATE OF DISCHARGE:                                 OPERATIVE REPORT   PREOPERATIVE DIAGNOSES:  1. Intrauterine gestation at 38+ five weeks.  2. History of prior cesarean section, desire for repeat cesarean section.   POSTOPERATIVE DIAGNOSES:  1. Intrauterine gestation at 38+ five weeks.  2. History of prior cesarean section, desire for repeat cesarean section.   PROCEDURE:  Repeat low segment transverse cesarean section.   ANESTHESIA:  Spinal.   SURGEON:  Randye Lobo, M.D.   ASSISTANT:  Miguel Aschoff, M.D.   IV FLUIDS:  4000 cc Ringer's lactate.   ESTIMATED BLOOD LOSS:  750 cc.   URINE OUTPUT:  500 cc.   COMPLICATIONS:  None.   INDICATIONS FOR PROCEDURE:  The patient is a 40 year old gravida 2, para 1  Caucasian female, at 58+ five weeks gestation; who has a history of a prior  cesarean section for fetal macrosomia, who throughout her pregnancy  expressed a desire for a repeat cesarean section.  The patient recently had  an ultrasound and documented an estimated fetal weight of approximately 4000  g.  A plan is made to proceed with a repeat low segment transverse cesarean  section, after risks, benefits and alternatives have been discussed.   FINDINGS:  A viable female was delivered at 9:47 a.m., with Apgar's of 8 at  one minute and 9 at five minutes.  The weight was later noted to be 9  pounds.  The placenta had a normal insertion of a three-vessel cord.  The  uterus, tubes and ovaries were normal.  The amniotic fluid was noted to be  clear.   SPECIMENS:  None.   DESCRIPTION OF PROCEDURE:  The patient was reidentified in the preoperative  hold area, and  was then taken to the operating room.  The patient received a  spinal anesthetic and was then placed in the supine position with a left  lateral tilt.  The abdomen was sterilely prepped and draped and a Foley  catheter was placed inside the bladder.   A Pfannenstiel skin incision was created along the patient's previous  incision using a scalpel.  This was carried down to the fascia using  monopolar cautery.  The fascia was incised in the midline and the incision  was extended sharply bilaterally.  The rectus muscles were dissected off of  the overlying fascia using sharp dissection, both superiorly and inferiorly.  The rectus muscles were then sharply divided in the midline, and the  parietoperitoneum was entered sharply after it was elevated with a hemostat  clamp.  The parietoperitoneal incision was extended sharply cranially and  caudally.   The bladder retractor was then placed over the bladder, and the bladder flap  was created sharply.  The transverse lower uterine segment incision was then  created with the scalpel, and this was extended bilaterally in an upward  fashion with the bandage scissors.  Membranes were ruptured and a hand was  inserted through the uterine incision.  The vertex was noted to be High in  the pelvis.  A Mighty-Vac was used to assist in delivery of the vertex,  which was also accomplished with partial transection of the right rectus  muscle.  There were two pop-offs using the vacuum.  The vertex was  ultimately delivered spontaneously with uterine fundal pressure, following  the incision extension (partial transection of the rectus muscle) on the  patient's right hand side.  The remainder of the newborn was delivered, and  the nares and mouth were suctioned.  The cord was doubly clamped and cut,  and the newborn was carried over to the awaiting pediatricians in vigorous  condition.   Cord blood was obtained and then the placenta was manually extracted.   A  moistened lap pad was used to remove any remaining products of conception  from within the uterine cavity.  The uterus was exteriorized for its  closure.  The uterine incision was closed with a double-layered closure of  #1 chromic.  The first was a running, locked layer; the second was an  embrocating layer.  There was some bleeding noted along the inferior aspect  of the right apex of the uterine incision, and this responded well to a  figure-of-eight suture of #1 chromic.  The peritoneal cavity was then  irrigated and suctioned of any remaining blood and clots.  The  uterus,  which had been exteriorized before its closure, was then returned to the  peritoneal cavity.   The peritoneum was closed with a running suture of 3-0 Vicryl.  The right  rectus muscle was then reapproximated with simple sutures of #1 chromic.  The rectus muscles were then brought together in the midline with  interrupted sutures of #1 chromic.  The fascia was then closed with a  running suture of 0 Vicryl.  The subcutaneous tissue was irrigated and  suctioned with Crystalloid solution.  It was found to be hemostatic.  Interrupted sutures of 3-0 plain were placed in the subcutaneous layer and  the skin was then closed with staples.  A sterile pressure dressing was  placed over this.   This concluded the patient's procedure.  There were no complications.  All  sponge, needle and instrument counts were correct.                                               Randye Lobo, M.D.    BES/MEDQ  D:  08/14/2003  Gail [name]:  08/16/2003  Job:  [id number]

## 2011-04-05 ENCOUNTER — Other Ambulatory Visit: Payer: Self-pay | Admitting: Family Medicine

## 2011-04-05 NOTE — Telephone Encounter (Signed)
[facility] request refill Lexapro 20 mg. I added to [employer] from [employer]. Pt last seen 03/29/10.Please advise. No future appt has been made for pt.

## 2011-04-06 NOTE — Telephone Encounter (Signed)
Dr.Tower's patient, will change PCP, only seen Dr.Letvak for acute visit in 2009.

## 2011-04-06 NOTE — Telephone Encounter (Signed)
Will refill electronically  

## 2011-05-17 ENCOUNTER — Other Ambulatory Visit: Payer: Self-pay | Admitting: Family Medicine

## 2011-05-17 NOTE — Telephone Encounter (Signed)
[facility] request refill Trilipix 135 mg. Med was not on med list but I added to med list. Pt last seen 03/29/10. Is it OK to refill.Marland Kitchen?

## 2011-05-17 NOTE — Telephone Encounter (Signed)
If you go chart review then labs tab. The labs dated 01/11/10 are scanned in to pts chart. Copy of labs are on your shelf in the in box.Thank you.

## 2011-05-17 NOTE — Telephone Encounter (Signed)
I cannot find labs on her for cholesterol - ? When was her last cholesterol test ? , here or elsewhere?

## 2011-05-18 NOTE — Telephone Encounter (Signed)
Gail [name], please schedule for f/u with Dr Milinda Antis and will do labs at the visit. Thank you.

## 2011-05-18 NOTE — Telephone Encounter (Signed)
Will refill electronically Schedule f/u please - will do visit and labs that day

## 2011-05-18 NOTE — Telephone Encounter (Signed)
LM for pt to call back and schedule f/u.

## 2011-06-08 ENCOUNTER — Ambulatory Visit: Payer: Self-pay | Admitting: Family Medicine

## 2011-06-15 ENCOUNTER — Telehealth: Payer: Self-pay

## 2011-06-15 NOTE — Telephone Encounter (Signed)
Patient advised.

## 2011-06-15 NOTE — Telephone Encounter (Signed)
She needs to be seen before I consider abx.

## 2011-06-15 NOTE — Telephone Encounter (Signed)
Pt said for 2 weeks has deep non productive cough,head congestion,runny nose,laryngitis, and had fever until 3 days ago. Pt also has no energy. Pt has no wheezing,SOB chest pain, earache or S/T.  Pt is not using saline and not taking any med OTC for symptoms. Pt request z pak called to [facility]. Pt can be reached at [phone]. Dr Milinda Antis is supposed to be off the computer this afternoon. Please advise.

## 2011-08-01 ENCOUNTER — Other Ambulatory Visit: Payer: Self-pay | Admitting: Family Medicine

## 2011-08-02 NOTE — Telephone Encounter (Signed)
Will refill electronically for short term Please call her to schedule appt

## 2011-08-02 NOTE — Telephone Encounter (Signed)
Patient hasn't been seen in some time, please advise. 

## 2011-08-02 NOTE — Telephone Encounter (Signed)
Left message on cell phone voicemail advising patient that Rx was sent to pharmacy and advised her to call back and schedule a f/u appt with Dr. Milinda Antis.

## 2011-10-17 ENCOUNTER — Other Ambulatory Visit: Payer: Self-pay | Admitting: Family Medicine

## 2011-10-17 NOTE — Telephone Encounter (Signed)
Last OV was 03/29/10. Last labs was 01/11/10. Last time Rx was filled was 05/17/11. Scheduled appt for 12/12/11. Ok to refill?

## 2011-10-18 NOTE — Telephone Encounter (Signed)
Will refill electronically  Has upcoming appt

## 2011-12-04 ENCOUNTER — Telehealth: Payer: Self-pay | Admitting: Family Medicine

## 2011-12-04 DIAGNOSIS — E781 Pure hyperglyceridemia: Secondary | ICD-10-CM

## 2011-12-04 DIAGNOSIS — Z0000 Encounter for general adult medical examination without abnormal findings: Secondary | ICD-10-CM | POA: Insufficient documentation

## 2011-12-04 NOTE — Telephone Encounter (Signed)
Message copied by Judy Pimple on Mon Dec 04, 2011  8:45 PM ------      Message from: Baldomero Lamy      Created: Tue Nov 28, 2011 12:44 PM      Regarding: cpx labs Tues 9/17       Please order  future cpx labs for pt's upcomming lab appt.      Thanks      Rodney Booze

## 2011-12-05 ENCOUNTER — Other Ambulatory Visit (INDEPENDENT_AMBULATORY_CARE_PROVIDER_SITE_OTHER): Payer: Self-pay

## 2011-12-05 DIAGNOSIS — E781 Pure hyperglyceridemia: Secondary | ICD-10-CM

## 2011-12-05 DIAGNOSIS — Z0000 Encounter for general adult medical examination without abnormal findings: Secondary | ICD-10-CM

## 2011-12-05 LAB — COMPREHENSIVE METABOLIC PANEL
ALT: 24 U/L (ref 0–35)
AST: 27 U/L (ref 0–37)
Albumin: 4.1 g/dL (ref 3.5–5.2)
Alkaline Phosphatase: 40 U/L (ref 39–117)
BUN: 11 mg/dL (ref 6–23)
CO2: 25 mEq/L (ref 19–32)
GFR: 84.42 mL/min (ref >60.00)
Glucose, Bld: 129 mg/dL — ABNORMAL HIGH (ref 70–99)
Potassium: 3.8 mEq/L (ref 3.5–5.1)
Sodium: 137 mEq/L (ref 135–145)
Total Bilirubin: 0.5 mg/dL (ref 0.3–1.2)
Total Protein: 7.3 g/dL (ref 6.0–8.3)

## 2011-12-05 LAB — CBC WITH DIFFERENTIAL/PLATELET
Basophils Absolute: 0 10*3/uL (ref 0.0–0.1)
Basophils Relative: 0.5 % (ref 0.0–3.0)
Eosinophils Relative: 2 % (ref 0.0–5.0)
HCT: 39.6 % (ref 36.0–46.0)
Hemoglobin: 12.9 g/dL (ref 12.0–15.0)
Lymphocytes Relative: 37.5 % (ref 12.0–46.0)
Lymphs Abs: 2.2 10*3/uL (ref 0.7–4.0)
MCHC: 32.4 g/dL (ref 30.0–36.0)
MCV: 88.9 fl (ref 78.0–100.0)
Monocytes Absolute: 0.3 10*3/uL (ref 0.1–1.0)
Platelets: 253 10*3/uL (ref 150.0–400.0)
RBC: 4.46 Mil/uL (ref 3.87–5.11)
WBC: 5.8 10*3/uL (ref 4.5–10.5)

## 2011-12-05 LAB — LIPID PANEL
Cholesterol: 178 mg/dL (ref 0–200)
HDL: 43.3 mg/dL (ref >39.00)
LDL Cholesterol: 104 mg/dL — ABNORMAL HIGH (ref 0–99)
Total CHOL/HDL Ratio: 4
Triglycerides: 156 mg/dL — ABNORMAL HIGH (ref 0.0–149.0)
VLDL: 31.2 mg/dL (ref 0.0–40.0)

## 2011-12-05 LAB — TSH: TSH: 0.88 u[IU]/mL (ref 0.35–5.50)

## 2011-12-12 ENCOUNTER — Other Ambulatory Visit: Payer: Self-pay | Admitting: Family Medicine

## 2011-12-12 ENCOUNTER — Encounter: Payer: Self-pay | Admitting: Family Medicine

## 2011-12-12 ENCOUNTER — Ambulatory Visit (INDEPENDENT_AMBULATORY_CARE_PROVIDER_SITE_OTHER): Payer: BC Managed Care – PPO | Admitting: Family Medicine

## 2011-12-12 VITALS — BP 136/78 | HR 66 | Temp 98.2°F | Ht 66.0 in | Wt 246.2 lb

## 2011-12-12 DIAGNOSIS — E162 Hypoglycemia, unspecified: Secondary | ICD-10-CM

## 2011-12-12 DIAGNOSIS — Z0000 Encounter for general adult medical examination without abnormal findings: Secondary | ICD-10-CM

## 2011-12-12 DIAGNOSIS — E781 Pure hyperglyceridemia: Secondary | ICD-10-CM

## 2011-12-12 DIAGNOSIS — Z1231 Encounter for screening mammogram for malignant neoplasm of breast: Secondary | ICD-10-CM | POA: Insufficient documentation

## 2011-12-12 DIAGNOSIS — Z23 Encounter for immunization: Secondary | ICD-10-CM

## 2011-12-12 NOTE — Assessment & Plan Note (Signed)
Mam sched- screening Will have her gyn exam and breast exam next month with gyn

## 2011-12-12 NOTE — Patient Instructions (Addendum)
Cholesterol is controlled  Keep working on healthy diet and exercise Stop up front to get referral for your mammogram  Tetanus shot today Don't forget to get the flu shot with your kids

## 2011-12-12 NOTE — Assessment & Plan Note (Signed)
Reviewed health habits including diet and exercise and skin cancer prevention Also reviewed health mt list, fam hx and immunizations  Tdap today Rev wellness labs

## 2011-12-12 NOTE — Assessment & Plan Note (Signed)
Continues to work on diet with small frequent meals and protein Sees Dr South-endocrine Per pt stable Sugar was 129 this check

## 2011-12-12 NOTE — Progress Notes (Signed)
Subjective:    Patient ID: Gail [name], female    DOB: 1970-08-17, 41 y.o.   MRN: [id number]  HPI Here for health maintenance exam and to review chronic medical problems  Is doing well overall   Nothing new medically   Still gets low sugars if she does not eat properly  Eats protein every 3 hours  Frustrated- wants to loose weight  Is trail walking    Hx of hypoglycemia Sugar was 129 this check    Chemistry      Component Value Date/Time   NA 137 12/05/2011 0843   K 3.8 12/05/2011 0843   CL 98 12/05/2011 0843   CO2 25 12/05/2011 0843   BUN 11 12/05/2011 0843   CREATININE 0.8 12/05/2011 0843      Component Value Date/Time   CALCIUM 8.3* 12/05/2011 0843   ALKPHOS 40 12/05/2011 0843   AST 27 12/05/2011 0843   ALT 24 12/05/2011 0843   BILITOT 0.5 12/05/2011 0843      Lab Results  Component Value Date   WBC 5.8 12/05/2011   HGB 12.9 12/05/2011   HCT 39.6 12/05/2011   MCV 88.9 12/05/2011   PLT 253.0 12/05/2011   Lab Results  Component Value Date   TSH 0.88 12/05/2011   Lab Results  Component Value Date   CHOL 178 12/05/2011   Lab Results  Component Value Date   HDL 43.30 12/05/2011   Lab Results  Component Value Date   LDLCALC 104* 12/05/2011   Lab Results  Component Value Date   TRIG 156.0* 12/05/2011   Lab Results  Component Value Date   CHOLHDL 4 12/05/2011   No results found for this basename: LDLDIRECT   overall not bad chol profile   mammo 7/10- needs to set that up , at [facility]  Self exam no lumps   Gyn care-- sees gyn next week   Td-? When last one was  Flu shot -- not allergic to it   Wt is up 5 lb with bmi of 39   Patient Active Problem List  Diagnosis  . HYPOGLYCEMIA, REACTIVE  . HYPERTRIGLYCERIDEMIA  . ANXIETY  . ALLERGIC RHINITIS  . MAMMOGRAM, ABNORMAL  . PROBLEMS WITH HEARING  . Routine general medical examination at a health care facility   No past medical history on file. No past surgical history on file. History  Substance  Use Topics  . Smoking status: Never Smoker   . Smokeless tobacco: Not on file  . Alcohol Use: Yes     occasional   No family history on file. Allergies  Allergen Reactions  . Penicillins    Current Outpatient Prescriptions on File Prior to Visit  Medication Sig Dispense Refill  . atorvastatin (LIPITOR) 20 MG tablet Take 20 mg by mouth daily.      . Choline Fenofibrate 135 MG capsule Take 135 mg by mouth daily.      Marland Kitchen escitalopram (LEXAPRO) 20 MG tablet TAKE 1 TABLET BY MOUTH ONCE A DAY  30 tablet  11  . GLYSET 25 MG tablet TAKE 1 TABLET BY MOUTH TWICE DAILY  60 tablet  1  . omega-3 acid ethyl esters (LOVAZA) 1 G capsule Take 2 g by mouth 3 (three) times a week.      Marland Kitchen DISCONTD: escitalopram (LEXAPRO) 20 MG tablet Take 20 mg by mouth daily.      Marland Kitchen DISCONTD: TRILIPIX 135 MG capsule TAKE 1 CAPSULE BY MOUTH DAILY  30 capsule  2  Review of Systems Review of Systems  Constitutional: Negative for fever, appetite change, fatigue and unexpected weight change.  Eyes: Negative for pain and visual disturbance.  Respiratory: Negative for cough and shortness of breath.   Cardiovascular: Negative for cp and occ palpitations (racing heart rate) if blood sugar goes too low  Gastrointestinal: Negative for nausea, diarrhea and constipation.  Genitourinary: Negative for urgency and frequency.  Skin: Negative for pallor or rash   Neurological: Negative for weakness, light-headedness, numbness and headaches.  Hematological: Negative for adenopathy. Does not bruise/bleed easily.  Psychiatric/Behavioral: Negative for dysphoric mood. The patient is not nervous/anxious.         Objective:   Physical Exam  Constitutional: She appears well-developed and well-nourished. No distress.       obese and well appearing   HENT:  Head: Normocephalic and atraumatic.  Right Ear: External ear normal.  Left Ear: External ear normal.  Nose: Nose normal.  Mouth/Throat: Oropharynx is clear and moist.  Eyes:  Conjunctivae normal and EOM are normal. Pupils are equal, round, and reactive to light. Right eye exhibits no discharge. Left eye exhibits no discharge. No scleral icterus.  Neck: Normal range of motion. Neck supple. No JVD present. Carotid bruit is not present. No thyromegaly present.  Cardiovascular: Normal rate, regular rhythm, normal heart sounds and intact distal pulses.  Exam reveals no gallop.   Pulmonary/Chest: Effort normal and breath sounds normal. No respiratory distress. She has no wheezes.  Abdominal: Soft. Bowel sounds are normal. She exhibits no distension, no abdominal bruit and no mass. There is no tenderness.  Musculoskeletal: Normal range of motion. She exhibits no edema and no tenderness.  Lymphadenopathy:    She has no cervical adenopathy.  Neurological: She is alert. She has normal reflexes. No cranial nerve deficit. She exhibits normal muscle tone. Coordination normal.  Skin: Skin is warm and dry. No rash noted. No erythema. No pallor.  Psychiatric: She has a normal mood and affect.          Assessment & Plan:

## 2011-12-12 NOTE — Assessment & Plan Note (Signed)
Controlled on current med Disc goals for lipids and reasons to control them Rev labs with pt Rev low sat fat diet in detail

## 2011-12-13 ENCOUNTER — Other Ambulatory Visit: Payer: Self-pay | Admitting: Family Medicine

## 2011-12-13 ENCOUNTER — Other Ambulatory Visit: Payer: Self-pay | Admitting: *Deleted

## 2012-01-16 ENCOUNTER — Ambulatory Visit
Admission: RE | Admit: 2012-01-16 | Discharge: 2012-01-16 | Disposition: A | Discharge status: Home / Self Care | Payer: BC Managed Care – PPO | Source: Ambulatory Visit | Attending: Family Medicine | Admitting: Family Medicine

## 2012-01-16 DIAGNOSIS — Z1231 Encounter for screening mammogram for malignant neoplasm of breast: Secondary | ICD-10-CM

## 2012-01-22 ENCOUNTER — Encounter: Payer: Self-pay | Admitting: *Deleted

## 2012-03-27 ENCOUNTER — Other Ambulatory Visit: Payer: Self-pay | Admitting: Family Medicine

## 2012-05-14 ENCOUNTER — Other Ambulatory Visit: Payer: Self-pay | Admitting: Family Medicine

## 2012-08-19 ENCOUNTER — Other Ambulatory Visit: Payer: Self-pay | Admitting: Family Medicine

## 2012-10-10 ENCOUNTER — Ambulatory Visit (INDEPENDENT_AMBULATORY_CARE_PROVIDER_SITE_OTHER): Payer: BC Managed Care – PPO | Admitting: Family Medicine

## 2012-10-10 ENCOUNTER — Encounter: Payer: Self-pay | Admitting: Family Medicine

## 2012-10-10 VITALS — BP 118/68 | HR 88 | Temp 98.5°F | Wt 233.2 lb

## 2012-10-10 DIAGNOSIS — R21 Rash and other nonspecific skin eruption: Secondary | ICD-10-CM | POA: Insufficient documentation

## 2012-10-10 MED ORDER — VALACYCLOVIR HCL 1 G PO TABS: 1000.0000 mg | ORAL_TABLET | Freq: Three times a day (TID) | ORAL | Status: DC

## 2012-10-10 MED ORDER — GABAPENTIN 300 MG PO CAPS
300.0000 mg | ORAL_CAPSULE | Freq: Two times a day (BID) | ORAL | Status: DC | PRN
Start: 2012-10-10 — End: 2014-11-27

## 2012-10-10 NOTE — Assessment & Plan Note (Signed)
I am suspicious for zostavax given dermatomal, unilateral and burning pain.  Given new rash on upper chest wall, will treat with valtrex. Gabapentin for burning pain.  Benadryl for itch. Less likely bug bites. Pt agrees with plan.

## 2012-10-10 NOTE — Patient Instructions (Signed)
I also am suspicious for shingles - as new rash developing, treat with valacyclovir three times daily for 7 days. Also use gabapentin for burning pain at night time.  May take second dose during the day. Use benadryl for itch during the day (may also make you sleepy) Let us know how you are doing with this.  Shingles Shingles (herpes zoster) is an infection that is caused by the same virus that causes chickenpox (varicella). The infection causes a painful skin rash and fluid-filled blisters, which eventually break open, crust over, and heal. It may occur in any area of the body, but it usually affects only one side of the body or face. The pain of shingles usually lasts about 1 month. However, some people with shingles may develop long-term (chronic) pain in the affected area of the body. Shingles often occurs many years after the person had chickenpox. It is more common:  In people older than 50 years.  In people with weakened immune systems, such as those with HIV, AIDS, or cancer.  In people taking medicines that weaken the immune system, such as transplant medicines.  In people under great stress. CAUSES  Shingles is caused by the varicella zoster virus (VZV), which also causes chickenpox. After a person is infected with the virus, it can remain in the person's body for years in an inactive state (dormant). To cause shingles, the virus reactivates and breaks out as an infection in a nerve root. The virus can be spread from person to person (contagious) through contact with open blisters of the shingles rash. It will only spread to people who have not had chickenpox. When these people are exposed to the virus, they may develop chickenpox. They will not develop shingles. Once the blisters scab over, the person is no longer contagious and cannot spread the virus to others. SYMPTOMS  Shingles shows up in stages. The initial symptoms may be pain, itching, and tingling in an area of the skin. This  pain is usually described as burning, stabbing, or throbbing.In a few days or weeks, a painful red rash will appear in the area where the pain, itching, and tingling were felt. The rash is usually on one side of the body in a band or belt-like pattern. Then, the rash usually turns into fluid-filled blisters. They will scab over and dry up in approximately 2 3 weeks. Flu-like symptoms may also occur with the initial symptoms, the rash, or the blisters. These may include:  Fever.  Chills.  Headache.  Upset stomach. DIAGNOSIS  Your caregiver will perform a skin exam to diagnose shingles. Skin scrapings or fluid samples may also be taken from the blisters. This sample will be examined under a microscope or sent to a lab for further testing. TREATMENT  There is no specific cure for shingles. Your caregiver will likely prescribe medicines to help you manage the pain, recover faster, and avoid long-term problems. This may include antiviral drugs, anti-inflammatory drugs, and pain medicines. HOME CARE INSTRUCTIONS   Take a cool bath or apply cool compresses to the area of the rash or blisters as directed. This may help with the pain and itching.   Only take over-the-counter or prescription medicines as directed by your caregiver.   Rest as directed by your caregiver.  Keep your rash and blisters clean with mild soap and cool water or as directed by your caregiver.  Do not pick your blisters or scratch your rash. Apply an anti-itch cream or numbing creams to the  affected area as directed by your caregiver.  Keep your shingles rash covered with a loose bandage (dressing).  Avoid skin contact with:  Babies.   Pregnant women.   Children with eczema.   Elderly people with transplants.   People with chronic illnesses, such as leukemia or AIDS.   Wear loose-fitting clothing to help ease the pain of material rubbing against the rash.  Keep all follow-up appointments with your  caregiver.If the area involved is on your face, you may receive a referral for follow-up to a specialist, such as an eye doctor (ophthalmologist) or an ear, nose, and throat (ENT) doctor. Keeping all follow-up appointments will help you avoid eye complications, chronic pain, or disability.  SEEK IMMEDIATE MEDICAL CARE IF:   You have facial pain, pain around the eye area, or loss of feeling on one side of your face.  You have ear pain or ringing in your ear.  You have loss of taste.  Your pain is not relieved with prescribed medicines.   Your redness or swelling spreads.   You have more pain and swelling.  Your condition is worsening or has changed.   You have a feveror persistent symptoms for more than 2 3 days.  You have a fever and your symptoms suddenly get worse. MAKE SURE YOU:  Understand these instructions.  Will watch your condition.  Will get help right away if you are not doing well or get worse. Document Released: 03/06/2005 Document Revised: 11/29/2011 Document Reviewed: 10/19/2011 [facility] Patient Information 2014 [place], [place].

## 2012-10-10 NOTE — Progress Notes (Signed)
  Subjective:    Patient ID: Gail [name], female    DOB: 1971/02/02, 42 y.o.   MRN: [id number]  HPI CC: rash  Rash on left posterior shoulder as well as pain shooting down left arm.  Started on Thursday with burning pain under L arm, then next morning woke up with blistering rash.  Has tried topical cortisone and after-bite OTC.  Last spot that developed was on left upper chest.  Continued burning pain under left arm.  + significant itching at rash worse during the day.  Pain worse at night.  Sister is PA - told she had shingles.  Comes in today for evaluation.  Has had chicken pox. No fevers/chills.  No family with similar rash.  Review of Systems Per HPI    Objective:   Physical Exam NAD, WDWN CF, obese Skin - papular rash on right side of upper back across shoulder blade with few spots down posterior upper arm.  New cluster of papules on right upper breast.    Assessment & Plan:

## 2012-10-23 ENCOUNTER — Other Ambulatory Visit: Payer: Self-pay | Admitting: Family Medicine

## 2012-10-23 NOTE — Telephone Encounter (Signed)
Electronic refill request, please advise  

## 2012-10-23 NOTE — Telephone Encounter (Signed)
Please refill for 6 mo 

## 2012-10-23 NOTE — Telephone Encounter (Signed)
done

## 2012-12-04 ENCOUNTER — Telehealth: Payer: Self-pay | Admitting: Family Medicine

## 2012-12-04 DIAGNOSIS — Z0000 Encounter for general adult medical examination without abnormal findings: Secondary | ICD-10-CM

## 2012-12-04 DIAGNOSIS — E781 Pure hyperglyceridemia: Secondary | ICD-10-CM

## 2012-12-04 NOTE — Telephone Encounter (Signed)
Message copied by Judy Pimple on Wed Dec 04, 2012  7:48 PM ------      Message from: Alvina Chou      Created: Thu Nov 28, 2012 11:25 AM      Regarding: Lab orders for Thursday, 9.18.14       Patient is scheduled for CPX labs, please order future labs, Thanks , Terri       ------

## 2012-12-05 ENCOUNTER — Other Ambulatory Visit (INDEPENDENT_AMBULATORY_CARE_PROVIDER_SITE_OTHER): Payer: BC Managed Care – PPO

## 2012-12-05 DIAGNOSIS — Z0000 Encounter for general adult medical examination without abnormal findings: Secondary | ICD-10-CM

## 2012-12-05 DIAGNOSIS — E781 Pure hyperglyceridemia: Secondary | ICD-10-CM

## 2012-12-05 LAB — CBC WITH DIFFERENTIAL/PLATELET
Basophils Relative: 0.5 % (ref 0.0–3.0)
Eosinophils Absolute: 0.1 10*3/uL (ref 0.0–0.7)
Eosinophils Relative: 1.6 % (ref 0.0–5.0)
HCT: 37.1 % (ref 36.0–46.0)
Hemoglobin: 12.7 g/dL (ref 12.0–15.0)
Lymphocytes Relative: 38.5 % (ref 12.0–46.0)
Lymphs Abs: 2.4 10*3/uL (ref 0.7–4.0)
MCHC: 34.2 g/dL (ref 30.0–36.0)
MCV: 85.8 fl (ref 78.0–100.0)
Monocytes Absolute: 0.4 10*3/uL (ref 0.1–1.0)
Monocytes Relative: 5.7 % (ref 3.0–12.0)
Neutro Abs: 3.4 10*3/uL (ref 1.4–7.7)
Neutrophils Relative %: 53.7 % (ref 43.0–77.0)
Platelets: 263 10*3/uL (ref 150.0–400.0)
RBC: 4.33 Mil/uL (ref 3.87–5.11)
RDW: 13.9 % (ref 11.5–14.6)
WBC: 6.3 10*3/uL (ref 4.5–10.5)

## 2012-12-05 LAB — COMPREHENSIVE METABOLIC PANEL
ALT: 13 U/L (ref 0–35)
AST: 15 U/L (ref 0–37)
Albumin: 4.1 g/dL (ref 3.5–5.2)
Alkaline Phosphatase: 46 U/L (ref 39–117)
BUN: 11 mg/dL (ref 6–23)
CO2: 22 mEq/L (ref 19–32)
Calcium: 8.8 mg/dL (ref 8.4–10.5)
Chloride: 108 mEq/L (ref 96–112)
Creatinine, Ser: 0.8 mg/dL (ref 0.4–1.2)
Glucose, Bld: 133 mg/dL — ABNORMAL HIGH (ref 70–99)
Potassium: 3.9 mEq/L (ref 3.5–5.1)
Sodium: 138 mEq/L (ref 135–145)
Total Bilirubin: 0.2 mg/dL — ABNORMAL LOW (ref 0.3–1.2)
Total Protein: 7 g/dL (ref 6.0–8.3)

## 2012-12-05 LAB — LIPID PANEL
Cholesterol: 178 mg/dL (ref 0–200)
Total CHOL/HDL Ratio: 4
Triglycerides: 208 mg/dL — ABNORMAL HIGH (ref 0.0–149.0)

## 2012-12-06 ENCOUNTER — Other Ambulatory Visit: Payer: BC Managed Care – PPO

## 2012-12-13 ENCOUNTER — Encounter: Payer: Self-pay | Admitting: Family Medicine

## 2012-12-13 ENCOUNTER — Ambulatory Visit (INDEPENDENT_AMBULATORY_CARE_PROVIDER_SITE_OTHER): Payer: BC Managed Care – PPO | Admitting: Family Medicine

## 2012-12-13 VITALS — BP 120/92 | HR 84 | Temp 98.8°F | Ht 65.5 in | Wt 228.8 lb

## 2012-12-13 DIAGNOSIS — E119 Type 2 diabetes mellitus without complications: Secondary | ICD-10-CM | POA: Insufficient documentation

## 2012-12-13 DIAGNOSIS — Z0000 Encounter for general adult medical examination without abnormal findings: Secondary | ICD-10-CM

## 2012-12-13 DIAGNOSIS — E781 Pure hyperglyceridemia: Secondary | ICD-10-CM

## 2012-12-13 DIAGNOSIS — E162 Hypoglycemia, unspecified: Secondary | ICD-10-CM

## 2012-12-13 DIAGNOSIS — E669 Obesity, unspecified: Secondary | ICD-10-CM

## 2012-12-13 DIAGNOSIS — R7309 Other abnormal glucose: Secondary | ICD-10-CM

## 2012-12-13 DIAGNOSIS — R739 Hyperglycemia, unspecified: Secondary | ICD-10-CM

## 2012-12-13 DIAGNOSIS — R7303 Prediabetes: Secondary | ICD-10-CM | POA: Insufficient documentation

## 2012-12-13 NOTE — Assessment & Plan Note (Signed)
Fasting gluc 133 Disc need to watch this She will get new meter and strips F/u Dr Evlyn Kanner

## 2012-12-13 NOTE — Assessment & Plan Note (Signed)
Reviewed health habits including diet and exercise and skin cancer prevention Also reviewed health mt list, fam hx and immunizations  Will keep working on diet and exercise Declines flu vaccine Sees gyn for that care  Wellness labs rev

## 2012-12-13 NOTE — Progress Notes (Signed)
Subjective:    Patient ID: Gail [name], female    DOB: 02/01/1971, 42 y.o.   MRN: [id number]  HPI Here for health maintenance exam and to review chronic medical problems    Had a case of shingles - and getting better  Will get the vaccine when old enough  Still has discoloration of skin and some pain (itching and sleepiness)   Has been taking care of herself   Wt is up 5 lb with bmi of 37  Pap - was last oct - with gyn and it was ok - will f/u next month   Flu vaccine  She declines them -will let us know if she changes her mind   Td 9/13  Mammogram 10/13- [facility]  Has noticed no new lumps   Hyperlipidemia/ trig Lab Results  Component Value Date   CHOL 178 12/05/2012   CHOL 178 12/05/2011   Lab Results  Component Value Date   HDL 42.10 12/05/2012   HDL 65.78 12/05/2011   Lab Results  Component Value Date   LDLCALC 104* 12/05/2011   Lab Results  Component Value Date   TRIG 208.0* 12/05/2012   TRIG 156.0* 12/05/2011   Lab Results  Component Value Date   CHOLHDL 4 12/05/2012   CHOLHDL 4 12/05/2011   Lab Results  Component Value Date   LDLDIRECT 106.4 12/05/2012   on fenofibric acid  Trig are up- nothing fried of fatty - some cheese  She is not eating much at all - no appetite with the wt loss drug   Hx of hypoglycemia--is doing better on glyset  Dr Evlyn Kanner This glucose was 133 Has been running well  She needs another meter   Mood -- has been pretty good overall on the lexapro - good mood/ no depression and is motivated   Is still exercising   Patient Active Problem List   Diagnosis Date Noted  . Skin rash 10/10/2012  . Other screening mammogram 12/12/2011  . Routine general medical examination at a health care facility 12/04/2011  . HYPERTRIGLYCERIDEMIA 04/07/2008  . PROBLEMS WITH HEARING 04/07/2008  . MAMMOGRAM, ABNORMAL 03/18/2008  . ANXIETY 02/07/2008  . ALLERGIC RHINITIS 02/07/2008  . HYPOGLYCEMIA, REACTIVE 11/15/2006   No past  medical history on file. No past surgical history on file. History  Substance Use Topics  . Smoking status: Never Smoker   . Smokeless tobacco: Not on file  . Alcohol Use: Yes     Comment: occasional   No family history on file. Allergies  Allergen Reactions  . Penicillins    Current Outpatient Prescriptions on File Prior to Visit  Medication Sig Dispense Refill  . atorvastatin (LIPITOR) 20 MG tablet Take 20 mg by mouth daily.      . Choline Fenofibrate (FENOFIBRIC ACID) 135 MG CPDR TAKE 1 CAPSULE BY MOUTH DAILY  30 capsule  3  . escitalopram (LEXAPRO) 20 MG tablet TAKE 1 TABLET BY MOUTH ONCE A DAY  30 tablet  5  . GLYSET 25 MG tablet TAKE 1 TABLET BY MOUTH TWICE DAILY  60 tablet  8  . gabapentin (NEURONTIN) 300 MG capsule Take 1 capsule (300 mg total) by mouth 2 (two) times daily as needed.  60 capsule  0  . omega-3 acid ethyl esters (LOVAZA) 1 G capsule Take 2 g by mouth 3 (three) times a week.      . valACYclovir (VALTREX) 1000 MG tablet Take 1 tablet (1,000 mg total) by mouth 3 (three)  times daily.  21 tablet  0   No current facility-administered medications on file prior to visit.     Review of Systems Review of Systems  Constitutional: Negative for fever, appetite change,  and unexpected weight change. pos for occ fatigue Eyes: Negative for pain and visual disturbance.  Respiratory: Negative for cough and shortness of breath.   Cardiovascular: Negative for cp or palpitations    Gastrointestinal: Negative for nausea, diarrhea and constipation.  Genitourinary: Negative for urgency and frequency.  Skin: Negative for pallor or rash   Neurological: Negative for weakness, light-headedness, numbness and headaches.  Hematological: Negative for adenopathy. Does not bruise/bleed easily.  Psychiatric/Behavioral: Negative for dysphoric mood. The patient is not nervous/anxious.          Objective:   Physical Exam  Constitutional: She appears well-developed and well-nourished. No  distress.  obese and well appearing     HENT:  Head: Normocephalic and atraumatic.  Right Ear: External ear normal.  Left Ear: External ear normal.  Nose: Nose normal.  Mouth/Throat: Oropharynx is clear and moist.  Eyes: Conjunctivae and EOM are normal. Pupils are equal, round, and reactive to light. Right eye exhibits no discharge. Left eye exhibits no discharge. No scleral icterus.  Neck: Normal range of motion. Neck supple. No JVD present. Carotid bruit is not present. No thyromegaly present.  Cardiovascular: Normal rate, regular rhythm, normal heart sounds and intact distal pulses.  Exam reveals no gallop.   Pulmonary/Chest: Effort normal and breath sounds normal. No respiratory distress. She has no wheezes.  Abdominal: Soft. Bowel sounds are normal. She exhibits no distension, no abdominal bruit and no mass. There is no tenderness.  Genitourinary:  Exam def to gyn physician  Musculoskeletal: She exhibits no edema and no tenderness.  Lymphadenopathy:    She has no cervical adenopathy.  Neurological: She is alert. She has normal reflexes. No cranial nerve deficit. She exhibits normal muscle tone. Coordination normal.  Skin: Skin is warm and dry. No rash noted. No erythema. No pallor.  Psychiatric: She has a normal mood and affect.          Assessment & Plan:

## 2012-12-13 NOTE — Assessment & Plan Note (Signed)
Trig over 200 On med - disc imp of eating lower fat diet ? If rel to inc sugar  Will continue to see dr Evlyn Kanner for that

## 2012-12-13 NOTE — Assessment & Plan Note (Signed)
Discussed how this problem influences overall health and the risks it imposes  Reviewed plan for weight loss with lower calorie diet (via better food choices and also portion control or program like weight watchers) and exercise building up to or more than 30 minutes 5 days per week including some aerobic activity   Pt is on wt loss medicine now

## 2012-12-13 NOTE — Patient Instructions (Signed)
Call your insurance co to see what type of meter and strips they will cover and let me know  You should re consider a flu vaccine  Keep working on healthy diet and exercise

## 2012-12-16 ENCOUNTER — Other Ambulatory Visit: Payer: Self-pay

## 2012-12-16 DIAGNOSIS — Z1231 Encounter for screening mammogram for malignant neoplasm of breast: Secondary | ICD-10-CM

## 2012-12-19 ENCOUNTER — Encounter: Payer: Self-pay | Admitting: Family Medicine

## 2012-12-19 ENCOUNTER — Telehealth: Payer: Self-pay | Admitting: Family Medicine

## 2012-12-19 NOTE — Telephone Encounter (Signed)
Pt left voicemail saying that she called her ins and they cover One Touch meters so pt needs a Rx for a one touch Meter with test strips and lancets, pt request call back once Rx ready

## 2012-12-20 NOTE — Telephone Encounter (Signed)
Left voicemail requesting pt to call office 

## 2012-12-20 NOTE — Telephone Encounter (Signed)
Done and in IN box 

## 2012-12-24 NOTE — Telephone Encounter (Signed)
Left 2nd voicemail requesting pt to call office  

## 2012-12-25 NOTE — Telephone Encounter (Signed)
Pt left v/m requesting cb [id number].

## 2012-12-26 NOTE — Telephone Encounter (Signed)
Spoke with pt and she wanted [place] to fax Rx to [facility]. Rx faxed

## 2013-01-21 ENCOUNTER — Ambulatory Visit: Payer: BC Managed Care – PPO

## 2013-01-21 ENCOUNTER — Ambulatory Visit
Admission: RE | Admit: 2013-01-21 | Discharge: 2013-01-21 | Disposition: A | Discharge status: Home / Self Care | Payer: BC Managed Care – PPO | Source: Ambulatory Visit

## 2013-01-21 DIAGNOSIS — Z1231 Encounter for screening mammogram for malignant neoplasm of breast: Secondary | ICD-10-CM

## 2013-06-19 ENCOUNTER — Other Ambulatory Visit: Payer: Self-pay | Admitting: Family Medicine

## 2013-06-19 NOTE — Telephone Encounter (Signed)
Please refill for a year  

## 2013-07-20 ENCOUNTER — Other Ambulatory Visit: Payer: Self-pay | Admitting: Family Medicine

## 2013-08-10 ENCOUNTER — Other Ambulatory Visit: Payer: Self-pay | Admitting: Family Medicine

## 2013-08-12 NOTE — Telephone Encounter (Signed)
Please schedule PE after 9/26 and refill until then, thanks

## 2013-08-12 NOTE — Telephone Encounter (Signed)
Electronic refill request, no recent/future appt., please advise  

## 2013-08-14 NOTE — Telephone Encounter (Signed)
Rx refilled and left voicemail letting pt know she needs to schedule her CPE after 12/13/13

## 2013-10-02 ENCOUNTER — Other Ambulatory Visit: Payer: Self-pay | Admitting: Family Medicine

## 2013-10-02 NOTE — Telephone Encounter (Signed)
Electronic refill request, no recent/future appt., please advise  

## 2013-10-02 NOTE — Telephone Encounter (Signed)
Please refill for 6 months 

## 2013-10-02 NOTE — Telephone Encounter (Signed)
done

## 2013-12-26 ENCOUNTER — Other Ambulatory Visit: Payer: Self-pay

## 2013-12-26 DIAGNOSIS — Z1231 Encounter for screening mammogram for malignant neoplasm of breast: Secondary | ICD-10-CM

## 2014-01-28 ENCOUNTER — Ambulatory Visit
Admission: RE | Admit: 2014-01-28 | Discharge: 2014-01-28 | Disposition: A | Discharge status: Home / Self Care | Payer: BC Managed Care – PPO | Source: Ambulatory Visit

## 2014-01-28 DIAGNOSIS — Z1231 Encounter for screening mammogram for malignant neoplasm of breast: Secondary | ICD-10-CM

## 2014-05-23 ENCOUNTER — Other Ambulatory Visit: Payer: Self-pay | Admitting: Family Medicine

## 2014-06-03 ENCOUNTER — Other Ambulatory Visit: Payer: Self-pay | Admitting: Family Medicine

## 2014-07-16 ENCOUNTER — Encounter: Payer: Self-pay | Admitting: Internal Medicine

## 2014-07-16 ENCOUNTER — Ambulatory Visit (INDEPENDENT_AMBULATORY_CARE_PROVIDER_SITE_OTHER): Payer: BLUE CROSS/BLUE SHIELD | Admitting: Internal Medicine

## 2014-07-16 VITALS — BP 120/82 | HR 89 | Temp 98.5°F | Wt 250.0 lb

## 2014-07-16 DIAGNOSIS — R52 Pain, unspecified: Secondary | ICD-10-CM

## 2014-07-16 DIAGNOSIS — B349 Viral infection, unspecified: Secondary | ICD-10-CM

## 2014-07-16 LAB — POCT INFLUENZA A/B
Influenza A, POC: NEGATIVE
Influenza B, POC: NEGATIVE

## 2014-07-16 NOTE — Patient Instructions (Signed)

## 2014-07-16 NOTE — Progress Notes (Signed)
HPI  Pt presents to the clinic today with c/o fever, chills and body aches. This started last night. Her fever was 101.0 last night. She denies runny nose, sore throat or cough. She did take some Ibuprofen OTC last night but has not taken anything today. She has had contacts with someone who was diagnosed with the flu 3 days ago. She does have a history of seasonal allergies, but does not taken anything OTC for that.   Review of Systems      Past Medical History  Diagnosis Date  . History of syncope   . History of depression   . History of anxiety   . History of allergic rhinitis   . History of hyperlipidemia     Family History  Problem Relation Age of Onset  . Diabetes Father   . Diabetes Paternal Grandfather   . Prostate cancer Other     Grandfather  . Diabetes Paternal Grandmother     History   Social History  . Marital Status: Married    Spouse Name: N/A  . Number of Children: N/A  . Years of Education: N/A   Occupational History  . Not on file.   Social History Main Topics  . Smoking status: Never Smoker   . Smokeless tobacco: Not on file  . Alcohol Use: Yes     Comment: occasional  . Drug Use: No  . Sexual Activity: Not on file   Other Topics Concern  . Not on file   Social History Narrative    Allergies  Allergen Reactions  . Penicillins      Constitutional: Positive body aches, chills and fever. Denies headache, fatigue or abrupt weight changes.  HEENT:  Denies eye redness, eye pain, pressure behind the eyes, facial pain, nasal congestion, ear pain, ringing in the ears, wax buildup, runny nose or sore throat. Respiratory: Denies cough, difficulty breathing or shortness of breath.  Cardiovascular: Denies chest pain, chest tightness, palpitations or swelling in the hands or feet.   No other specific complaints in a complete review of systems (except as listed in HPI above).  Objective:   BP 120/82 mmHg  Pulse 89  Temp(Src) 98.5 F (36.9 C)  (Oral)  Wt 250 lb (113.399 kg)  SpO2 98% Wt Readings from Last 3 Encounters:  07/16/14 250 lb (113.399 kg)  12/13/12 228 lb 12.8 oz (103.783 kg)  10/10/12 233 lb 4 oz (105.802 kg)     General: Appears her stated age, well developed, well nourished in NAD. HEENT: Head: normal shape and size; Eyes: sclera white, no icterus, conjunctiva pink; Ears: Tm's gray and intact, normal light reflex; Nose: mucosa pink and moist, septum midline; Throat/Mouth: Teeth present, mucosa erythematous and moist, no exudate noted, no lesions or ulcerations noted.  Neck: No cervical lymphadenopathy.  Cardiovascular: Normal rate and rhythm. S1,S2 noted.  No murmur, rubs or gallops noted. Pulmonary/Chest: Normal effort and positive vesicular breath sounds. No respiratory distress. No wheezes, rales or ronchi noted.      Assessment & Plan:   Viral illness:  Rapid Flu: negative Get some rest and drink plenty of water Ibuprofen as needed for body aches   RTC as needed or if symptoms persist.

## 2014-07-16 NOTE — Progress Notes (Signed)
Pre visit review using our clinic review tool, if applicable. No additional management support is needed unless otherwise documented below in the visit note. 

## 2014-07-20 ENCOUNTER — Encounter: Payer: BC Managed Care – PPO | Admitting: Family Medicine

## 2014-10-07 ENCOUNTER — Telehealth: Payer: Self-pay

## 2014-10-07 NOTE — Telephone Encounter (Signed)
Pt request refill [facility]ofibrate to [facility]; [facility] and [facility] not see an on hold rx for fenofibrate. Vicente Males said could take over phone and updated the fenofibrate rx # 90 x 1 for the 05/25/14 refill that was previously sent in electronically. Pt will ck with pharmacy to pick up rx. Pt has CPX scheduled on 01/22/2015.

## 2014-10-08 ENCOUNTER — Other Ambulatory Visit: Payer: Self-pay | Admitting: Family Medicine

## 2014-10-09 NOTE — Telephone Encounter (Signed)
Please refill to get by until her appt , thanks

## 2014-10-09 NOTE — Telephone Encounter (Signed)
Electronic refill request, pt has CPE scheduled on 01/22/15, last refilled on 06/19/13 #90 with 3 additional refills

## 2014-10-09 NOTE — Telephone Encounter (Signed)
done

## 2014-11-27 ENCOUNTER — Other Ambulatory Visit (HOSPITAL_COMMUNITY)
Admission: RE | Admit: 2014-11-27 | Discharge: 2014-11-27 | Disposition: A | Discharge status: Home / Self Care | Payer: BLUE CROSS/BLUE SHIELD | Source: Ambulatory Visit | Attending: Family Medicine | Admitting: Family Medicine

## 2014-11-27 ENCOUNTER — Encounter: Payer: Self-pay | Admitting: Family Medicine

## 2014-11-27 ENCOUNTER — Ambulatory Visit (INDEPENDENT_AMBULATORY_CARE_PROVIDER_SITE_OTHER): Payer: BLUE CROSS/BLUE SHIELD | Admitting: Family Medicine

## 2014-11-27 VITALS — BP 122/70 | HR 88 | Temp 98.2°F | Ht 65.5 in | Wt 250.5 lb

## 2014-11-27 DIAGNOSIS — Z01419 Encounter for gynecological examination (general) (routine) without abnormal findings: Secondary | ICD-10-CM | POA: Insufficient documentation

## 2014-11-27 DIAGNOSIS — E669 Obesity, unspecified: Secondary | ICD-10-CM

## 2014-11-27 DIAGNOSIS — E781 Pure hyperglyceridemia: Secondary | ICD-10-CM

## 2014-11-27 DIAGNOSIS — R739 Hyperglycemia, unspecified: Secondary | ICD-10-CM | POA: Diagnosis not present

## 2014-11-27 DIAGNOSIS — Z1151 Encounter for screening for human papillomavirus (HPV): Secondary | ICD-10-CM | POA: Insufficient documentation

## 2014-11-27 DIAGNOSIS — Z0000 Encounter for general adult medical examination without abnormal findings: Secondary | ICD-10-CM

## 2014-11-27 MED ORDER — ESCITALOPRAM OXALATE 20 MG PO TABS: 20.0000 mg | ORAL_TABLET | Freq: Every day | ORAL | Status: DC

## 2014-11-27 MED ORDER — FENOFIBRIC ACID 135 MG PO CPDR: 125.0000 mg | DELAYED_RELEASE_CAPSULE | Freq: Every day | ORAL | Status: DC

## 2014-11-27 MED ORDER — ATORVASTATIN CALCIUM 20 MG PO TABS: 20.0000 mg | ORAL_TABLET | Freq: Every day | ORAL | Status: DC

## 2014-11-27 NOTE — Progress Notes (Signed)
Subjective:    Patient ID: Gail [name], female    DOB: 21-Dec-1970, 44 y.o.   MRN: [id number]  HPI Here for health maintenance exam and to review chronic medical problems    Doing well  Taking care of herself for the most part  Had a great summer   Menses are longer - 10 days and heavy the whole time  Last 2 months  Vasectomy Not trying to get pregnant  Last gyn exam was 2 years ago  Used to see Dr Harolyn Rutherford Never had abnormal pap smears  Last OC was years ago  No problems  No hx of blood clots No smoking   Just  Finished a period    Wt is stable  bmi of 31- obese  Was on belviq -and did well for a while - not sure if she will re start it $$$- will see how things go   BP Readings from Last 3 Encounters:  11/27/14 122/70  07/16/14 120/82  12/13/12 120/92   HIV screening - not high risk -declines   Flu vaccine - declines   Mammogram 11/15 - will be due in Nov  Self exam - no lumps   Td 9/13   Overdue for labs  Hx of hyper and hypoglycemia Had f/u with Dr Forde Dandy -had a good report  Had an A1C- and it was in the normal range  Doing well with avoiding low glucose For exercise - runs and lifts weights    Hx of high triglycerides Lab Results  Component Value Date   CHOL 178 12/05/2012   HDL 42.10 12/05/2012   LDLCALC 104* 12/05/2011   LDLDIRECT 106.4 12/05/2012   TRIG 208.0* 12/05/2012   CHOLHDL 4 12/05/2012    Due for labs today but has not taken her statin or fibrate in a while    Takes lexapro for her mood  She is tearful if she misses a day  Patient Active Problem List   Diagnosis Date Noted  . Encounter for routine gynecological examination 11/27/2014  . Obesity 12/13/2012  . Hyperglycemia 12/13/2012  . Other screening mammogram 12/12/2011  . Routine general medical examination at a health care facility 12/04/2011  . HYPERTRIGLYCERIDEMIA 04/07/2008  . PROBLEMS WITH HEARING 04/07/2008  . MAMMOGRAM, ABNORMAL 03/18/2008  . ANXIETY 02/07/2008    . ALLERGIC RHINITIS 02/07/2008  . HYPOGLYCEMIA, REACTIVE 11/15/2006   Past Medical History  Diagnosis Date  . History of syncope   . History of depression   . History of anxiety   . History of allergic rhinitis   . History of hyperlipidemia    Past Surgical History  Procedure Laterality Date  . Cesarean section     Social History  Substance Use Topics  . Smoking status: Never Smoker   . Smokeless tobacco: None  . Alcohol Use: 0.0 oz/week    0 Standard drinks or equivalent per week     Comment: occasional   Family History  Problem Relation Age of Onset  . Diabetes Father   . Diabetes Paternal Grandfather   . Prostate cancer Other     Grandfather  . Diabetes Paternal Grandmother    Allergies  Allergen Reactions  . Penicillins    Current Outpatient Prescriptions on File Prior to Visit  Medication Sig Dispense Refill  . miglitol (GLYSET) 25 MG tablet Take 1 tablet (25 mg total) by mouth 2 (two) times daily. 60 tablet 5   No current facility-administered medications on file prior to visit.  Review of Systems Review of Systems  Constitutional: Negative for fever, appetite change, fatigue and unexpected weight change.  Eyes: Negative for pain and visual disturbance.  Respiratory: Negative for cough and shortness of breath.   Cardiovascular: Negative for cp or palpitations    Gastrointestinal: Negative for nausea, diarrhea and constipation.  Genitourinary: Negative for urgency and frequency.  Skin: Negative for pallor or rash   Neurological: Negative for weakness, light-headedness, numbness and headaches.  Hematological: Negative for adenopathy. Does not bruise/bleed easily.  Psychiatric/Behavioral: Negative for dysphoric mood. The patient is not nervous/anxious.          Objective:   Physical Exam  Constitutional: She appears well-developed and well-nourished. No distress.  obese and well appearing   HENT:  Head: Normocephalic and atraumatic.  Right Ear:  External ear normal.  Left Ear: External ear normal.  Mouth/Throat: Oropharynx is clear and moist.  Eyes: Conjunctivae and EOM are normal. Pupils are equal, round, and reactive to light. No scleral icterus.  Neck: Normal range of motion. Neck supple. No JVD present. Carotid bruit is not present. No thyromegaly present.  Cardiovascular: Normal rate, regular rhythm, normal heart sounds and intact distal pulses.  Exam reveals no gallop.   Pulmonary/Chest: Effort normal and breath sounds normal. No respiratory distress. She has no wheezes. She exhibits no tenderness.  Abdominal: Soft. Bowel sounds are normal. She exhibits no distension, no abdominal bruit and no mass. There is no tenderness.  Genitourinary: Vagina normal and uterus normal. No breast swelling, tenderness, discharge or bleeding. There is no rash, tenderness or lesion on the right labia. There is no rash, tenderness or lesion on the left labia. Uterus is not enlarged and not tender. Cervix exhibits no motion tenderness, no discharge and no friability. Right adnexum displays no mass, no tenderness and no fullness. Left adnexum displays no mass, no tenderness and no fullness. No bleeding in the vagina. No vaginal discharge found.  Breast exam: No mass, nodules, thickening, tenderness, bulging, retraction, inflamation, nipple discharge or skin changes noted.  No axillary or clavicular LA.      Musculoskeletal: Normal range of motion. She exhibits no edema or tenderness.  Lymphadenopathy:    She has no cervical adenopathy.  Neurological: She is alert. She has normal reflexes. No cranial nerve deficit. She exhibits normal muscle tone. Coordination normal.  Skin: Skin is warm and dry. No rash noted. No erythema. No pallor.  Lentigo diffusely Tanned   Psychiatric: She has a normal mood and affect.          Assessment & Plan:   Problem List Items Addressed This Visit      Other   Encounter for routine gynecological examination     Routine exam with pap  Last 2 menses have been heavier- ? If rel to age or hormone change Disc opt of OC- she wants to give it more time and will alert me if she wishes to start one (would consider monophasic pill like ortho cyclen)       Relevant Orders   Cytology - PAP   Hyperglycemia    Per pt doing better- recent check from Dr Forde Dandy Is trying to loose wt (has used Belviq on and off) and is exercising       HYPERTRIGLYCERIDEMIA    Pt is off her fibrate and statin  Willing to re start these Diet is fair  Disc goals for lipids and reasons to control them Rev labs with pt- from last check  Rev low  sat fat diet in detail  Will get back on medicines and re check at fasting lab in Oct       Relevant Medications   atorvastatin (LIPITOR) 20 MG tablet   Choline Fenofibrate (FENOFIBRIC ACID) 135 MG CPDR   Obesity    Discussed how this problem influences overall health and the risks it imposes  Reviewed plan for weight loss with lower calorie diet (via better food choices and also portion control or program like weight watchers) and exercise building up to or more than 30 minutes 5 days per week including some aerobic activity   Pt has gained and lost since last visit -working with her endocrinologist and trying Belviq (on and off)  Enc her to keep working on this       Routine general medical examination at a health care facility - Primary    Reviewed health habits including diet and exercise and skin cancer prevention Reviewed appropriate screening tests for age  Also reviewed health mt list, fam hx and immunization status , as well as social and family history   See HPI Labs are planned for Oct when she is back on her medicines Enc exercise and better diet  She declines flu vaccine -enc 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

## 2014-11-27 NOTE — Progress Notes (Signed)
Pre visit review using our clinic review tool, if applicable. No additional management support is needed unless otherwise documented below in the visit note. 

## 2014-11-27 NOTE — Patient Instructions (Signed)
Please schedule fasting labs in Oct or nov Get started on your medicines - and update me if any problems Keep working on diet and exercise  Get your mammogram in Nov or Dec   Take care of yourself

## 2014-11-29 NOTE — Assessment & Plan Note (Signed)
Pt is off her fibrate and statin  Willing to re start these Diet is fair  Disc goals for lipids and reasons to control them Rev labs with pt- from last check  Rev low sat fat diet in detail  Will get back on medicines and re check at fasting lab in Oct

## 2014-11-29 NOTE — Assessment & Plan Note (Signed)
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

## 2014-11-29 NOTE — Assessment & Plan Note (Signed)
Routine exam with pap  Last 2 menses have been heavier- ? If rel to age or hormone change Disc opt of OC- she wants to give it more time and will alert me if she wishes to start one (would consider monophasic pill like ortho cyclen)

## 2014-11-29 NOTE — Assessment & Plan Note (Signed)
Discussed how this problem influences overall health and the risks it imposes  Reviewed plan for weight loss with lower calorie diet (via better food choices and also portion control or program like weight watchers) and exercise building up to or more than 30 minutes 5 days per week including some aerobic activity   Pt has gained and lost since last visit -working with her endocrinologist and trying Belviq (on and off)  Enc her to keep working on this

## 2014-11-29 NOTE — Assessment & Plan Note (Signed)
Per pt doing better- recent check from Dr Forde Dandy Is trying to loose wt (has used Belviq on and off) and is exercising

## 2014-12-01 LAB — CYTOLOGY - PAP

## 2014-12-24 ENCOUNTER — Other Ambulatory Visit: Payer: Self-pay

## 2014-12-24 DIAGNOSIS — Z1231 Encounter for screening mammogram for malignant neoplasm of breast: Secondary | ICD-10-CM

## 2015-01-18 ENCOUNTER — Other Ambulatory Visit (INDEPENDENT_AMBULATORY_CARE_PROVIDER_SITE_OTHER): Payer: BLUE CROSS/BLUE SHIELD

## 2015-01-18 DIAGNOSIS — Z0000 Encounter for general adult medical examination without abnormal findings: Secondary | ICD-10-CM | POA: Diagnosis not present

## 2015-01-18 LAB — COMPREHENSIVE METABOLIC PANEL
ALT: 13 U/L (ref 0–35)
AST: 15 U/L (ref 0–37)
Albumin: 3.8 g/dL (ref 3.5–5.2)
Alkaline Phosphatase: 45 U/L (ref 39–117)
BILIRUBIN TOTAL: 0.3 mg/dL (ref 0.2–1.2)
BUN: 11 mg/dL (ref 6–23)
CO2: 27 meq/L (ref 19–32)
Calcium: 8.8 mg/dL (ref 8.4–10.5)
Chloride: 105 mEq/L (ref 96–112)
Creatinine, Ser: 0.71 mg/dL (ref 0.40–1.20)
GFR: 95.42 mL/min (ref >60.00)
Glucose, Bld: 100 mg/dL — ABNORMAL HIGH (ref 70–99)
Potassium: 4.2 mEq/L (ref 3.5–5.1)
Sodium: 140 mEq/L (ref 135–145)
Total Protein: 6.8 g/dL (ref 6.0–8.3)

## 2015-01-18 LAB — CBC WITH DIFFERENTIAL/PLATELET
BASOS ABS: 0 10*3/uL (ref 0.0–0.1)
BASOS PCT: 0.6 % (ref 0.0–3.0)
EOS ABS: 0.1 10*3/uL (ref 0.0–0.7)
Eosinophils Relative: 1.6 % (ref 0.0–5.0)
HCT: 35.8 % — ABNORMAL LOW (ref 36.0–46.0)
Hemoglobin: 11.6 g/dL — ABNORMAL LOW (ref 12.0–15.0)
LYMPHS ABS: 2.4 10*3/uL (ref 0.7–4.0)
Lymphocytes Relative: 37.9 % (ref 12.0–46.0)
MCHC: 32.3 g/dL (ref 30.0–36.0)
MCV: 80.9 fl (ref 78.0–100.0)
Monocytes Absolute: 0.3 10*3/uL (ref 0.1–1.0)
Monocytes Relative: 5.2 % (ref 3.0–12.0)
NEUTROS PCT: 54.7 % (ref 43.0–77.0)
Neutro Abs: 3.5 10*3/uL (ref 1.4–7.7)
Platelets: 272 10*3/uL (ref 150.0–400.0)
RBC: 4.43 Mil/uL (ref 3.87–5.11)
RDW: 14.6 % (ref 11.5–15.5)
WBC: 6.3 10*3/uL (ref 4.0–10.5)

## 2015-01-18 LAB — LIPID PANEL
CHOL/HDL RATIO: 3
Cholesterol: 152 mg/dL (ref 0–200)
HDL: 51.6 mg/dL (ref >39.00)
LDL CALC: 77 mg/dL (ref 0–99)
NonHDL: 100.16
TRIGLYCERIDES: 115 mg/dL (ref 0.0–149.0)
VLDL: 23 mg/dL (ref 0.0–40.0)

## 2015-01-18 LAB — TSH: TSH: 1.08 u[IU]/mL (ref 0.35–4.50)

## 2015-01-22 ENCOUNTER — Encounter: Payer: BLUE CROSS/BLUE SHIELD | Admitting: Family Medicine

## 2015-01-27 ENCOUNTER — Other Ambulatory Visit: Payer: Self-pay | Admitting: Family Medicine

## 2015-02-10 ENCOUNTER — Ambulatory Visit
Admission: RE | Admit: 2015-02-10 | Discharge: 2015-02-10 | Disposition: A | Discharge status: Home / Self Care | Payer: BLUE CROSS/BLUE SHIELD | Source: Ambulatory Visit

## 2015-02-10 DIAGNOSIS — Z1231 Encounter for screening mammogram for malignant neoplasm of breast: Secondary | ICD-10-CM

## 2015-02-23 ENCOUNTER — Ambulatory Visit (INDEPENDENT_AMBULATORY_CARE_PROVIDER_SITE_OTHER): Payer: BLUE CROSS/BLUE SHIELD | Admitting: Primary Care

## 2015-02-23 ENCOUNTER — Encounter: Payer: Self-pay | Admitting: Primary Care

## 2015-02-23 VITALS — BP 126/84 | HR 87 | Temp 98.3°F | Ht 65.5 in | Wt 253.8 lb

## 2015-02-23 DIAGNOSIS — J209 Acute bronchitis, unspecified: Secondary | ICD-10-CM

## 2015-02-23 MED ORDER — HYDROCODONE-HOMATROPINE 5-1.5 MG/5ML PO SYRP: 5.0000 mL | ORAL_SOLUTION | Freq: Every evening | ORAL | Status: DC | PRN

## 2015-02-23 MED ORDER — AZITHROMYCIN 250 MG PO TABS: ORAL_TABLET | ORAL | Status: DC

## 2015-02-23 MED ORDER — BENZONATATE 200 MG PO CAPS: 200.0000 mg | ORAL_CAPSULE | Freq: Three times a day (TID) | ORAL | Status: DC | PRN

## 2015-02-23 NOTE — Progress Notes (Signed)
Subjective:    Patient ID: Gail [name], female    DOB: 10-Nov-1970, 44 y.o.   MRN: [id number]  HPI  [name] is a 44 year old female who presents today with a chief complaint of cough. Her cough has been present for the past 2 weeks. Denies fevers, chills, headache, body aches. Overall she feels tired and that her cough has not improved. 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 She's taken Advil cold and flu without relief.   Review of Systems  Constitutional: Negative for fever and chills.  HENT: Positive for congestion. Negative for ear pain, sinus pressure and sore throat.   Respiratory: Positive for cough.   Musculoskeletal: Negative for myalgias.  Neurological: Negative for headaches.       Past Medical History  Diagnosis Date  . History of syncope   . History of depression   . History of anxiety   . History of allergic rhinitis   . History of hyperlipidemia     Social History   Social History  . Marital Status: Married    Spouse Name: N/A  . Number of Children: N/A  . Years of Education: N/A   Occupational History  . Not on file.   Social History Main Topics  . Smoking status: Never Smoker   . Smokeless tobacco: Not on file  . Alcohol Use: 0.0 oz/week    0 Standard drinks or equivalent per week     Comment: occasional  . Drug Use: No  . Sexual Activity: Not on file   Other Topics Concern  . Not on file   Social History Narrative    Past Surgical History  Procedure Laterality Date  . Cesarean section      Family History  Problem Relation Age of Onset  . Diabetes Father   . Diabetes Paternal Grandfather   . Prostate cancer Other     Grandfather  . Diabetes Paternal Grandmother     Allergies  Allergen Reactions  . Penicillins     Current Outpatient Prescriptions on File Prior to Visit  Medication Sig Dispense Refill  . atorvastatin (LIPITOR) 20 MG tablet Take 1 tablet (20 mg total) by mouth daily.  90 tablet 3  . Choline Fenofibrate (FENOFIBRIC ACID) 135 MG CPDR Take 125 mg by mouth daily. 90 capsule 3  . escitalopram (LEXAPRO) 20 MG tablet Take 1 tablet (20 mg total) by mouth daily. 90 tablet 3  . miglitol (GLYSET) 25 MG tablet TAKE 1 TABLET (25 MG TOTAL) BY MOUTH 2 (TWO) TIMES DAILY. 60 tablet 11   No current facility-administered medications on file prior to visit.    BP 126/84 mmHg  Pulse 87  Temp(Src) 98.3 F (36.8 C) (Oral)  Ht 5' 5.5" (1.664 m)  Wt 253 lb 12.8 oz (115.123 kg)  BMI 41.58 kg/m2  SpO2 98%  LMP 01/27/2015    Objective:   Physical Exam  Constitutional: She appears well-nourished.  Dry cough present during exam.  HENT:  Right Ear: Tympanic membrane and ear canal normal.  Left Ear: Tympanic membrane and ear canal normal.  Eyes: Conjunctivae are normal. Pupils are equal, round, and reactive to light.  Neck: Neck supple.  Cardiovascular: Normal rate and regular rhythm.   Pulmonary/Chest: Effort normal. She has no decreased breath sounds. She has no wheezes. She has rhonchi in the right upper field and the left upper field.  Lymphadenopathy:    She has no cervical adenopathy.  Skin: Skin  is warm and dry.          Assessment & Plan:  Acute bronchitis:  Cough for 2+ weeks. Denies fevers, chills, body aches. No improvement with OTC treatment. Exam with rhonchi to bilateral upper lobes. Dry, irritative cough throughout exam. Due to duration, presentation, and no improvement, will treat. RX for zpak, tessalon pearls, and Hycodan at bedtime provided. Fluids, rest, return precautions provided.

## 2015-02-23 NOTE — Progress Notes (Signed)
Pre visit review using our clinic review tool, if applicable. No additional management support is needed unless otherwise documented below in the visit note. 

## 2015-02-23 NOTE — Patient Instructions (Signed)
Start Azithromycin antibiotics. Take 2 tablets by mouth today, then 1 tablet daily for 4 additional days.  You may take Benzonatate capsules for cough. Take 1 capsule by mouth three times daily as needed for cough.  You may take the Hycodan cough suppressant at bedtime as needed for cough and rest. Caution this medication contains codeine and will make you feel drowsy.  Increase consumption of fluids and rest.  It was a pleasure meeting you!

## 2015-07-06 DIAGNOSIS — E784 Other hyperlipidemia: Secondary | ICD-10-CM | POA: Diagnosis not present

## 2015-07-06 DIAGNOSIS — R7302 Impaired glucose tolerance (oral): Secondary | ICD-10-CM | POA: Diagnosis not present

## 2015-07-06 DIAGNOSIS — E668 Other obesity: Secondary | ICD-10-CM | POA: Diagnosis not present

## 2015-07-06 DIAGNOSIS — R569 Unspecified convulsions: Secondary | ICD-10-CM | POA: Diagnosis not present

## 2015-07-06 DIAGNOSIS — Z1389 Encounter for screening for other disorder: Secondary | ICD-10-CM | POA: Diagnosis not present

## 2015-07-26 ENCOUNTER — Ambulatory Visit (INDEPENDENT_AMBULATORY_CARE_PROVIDER_SITE_OTHER): Payer: Worker's Compensation | Admitting: Internal Medicine

## 2015-07-26 ENCOUNTER — Telehealth: Payer: Self-pay | Admitting: *Deleted

## 2015-07-26 VITALS — BP 128/84 | HR 103 | Temp 98.1°F | Resp 18 | Ht 65.5 in | Wt 254.6 lb

## 2015-07-26 DIAGNOSIS — Z209 Contact with and (suspected) exposure to unspecified communicable disease: Secondary | ICD-10-CM

## 2015-07-26 DIAGNOSIS — S6982XA Other specified injuries of left wrist, hand and finger(s), initial encounter: Secondary | ICD-10-CM | POA: Diagnosis not present

## 2015-07-26 DIAGNOSIS — IMO0001 Reserved for inherently not codable concepts without codable children: Secondary | ICD-10-CM

## 2015-07-26 NOTE — Progress Notes (Signed)
By signing my name below, I, Mesha Guinyard, attest that this documentation has been prepared under the direction and in the presence of Tami Lin, MD.  Electronically Signed: Verlee Monte, Medical Scribe. 07/26/2015. 4:29 PM.  Subjective:    Patient ID: Gail [name], female    DOB: 07-Aug-1970, 45 y.o.   MRN: [id number]  HPI Chief Complaint  Patient presents with  . Needle Stick at Work    Pt. 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     HPI Comments: Gail [name] is a 45 y.o. 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 Pt reports her last tetanus shot was in September. Pt mentions hx of having Hep B immunization.   The paperwork with the patient has no description of the injury, and no name of the source patient, and none of the specifics filled out were signed for by her Chiropodist. There was no after-hours phone service to reach any responsible person to arrange for testing of the source patient or further investigation of the incident.  Patient Active Problem List which should have nothing to do with this current presentation    Diagnosis Date Noted  . Encounter for routine gynecological examination 11/27/2014  . Obesity 12/13/2012  . Hyperglycemia 12/13/2012  . Other screening mammogram 12/12/2011  . Routine general medical examination at a health care facility 12/04/2011  . HYPERTRIGLYCERIDEMIA 04/07/2008  . PROBLEMS WITH HEARING 04/07/2008  . MAMMOGRAM, ABNORMAL 03/18/2008  . ANXIETY 02/07/2008  . ALLERGIC RHINITIS 02/07/2008  . HYPOGLYCEMIA, REACTIVE 11/15/2006    Current outpatient prescriptions:  .  atorvastatin (LIPITOR) 20 MG tablet, Take 1 tablet (20 mg total) by mouth daily., Disp: 90 tablet, Rfl: 3 .  azithromycin (ZITHROMAX Z-PAK) 250 MG tablet, Take 2 tablets by mouth today, then 1  tablet by mouth daily for 4 additional days., Disp: 6 each, Rfl: 0 .  escitalopram (LEXAPRO) 20 MG tablet, Take 1 tablet (20 mg total) by mouth daily., Disp: 90 tablet, Rfl: 3 .  miglitol (GLYSET) 25 MG tablet, TAKE 1 TABLET (25 MG TOTAL) BY MOUTH 2 (TWO) TIMES DAILY., Disp: 60 tablet, Rfl: 11 .  benzonatate (TESSALON) 200 MG capsule, Take 1 capsule (200 mg total) by mouth 3 (three) times daily as needed. (Patient not taking: Reported on 07/26/2015), Disp: 21 capsule, Rfl: 0 .  Choline Fenofibrate (FENOFIBRIC ACID) 135 MG CPDR, Take 125 mg by mouth daily. (Patient not taking: Reported on 07/26/2015), Disp: 90 capsule, Rfl: 3 .  HYDROcodone-homatropine (HYCODAN) 5-1.5 MG/5ML syrup, Take 5 mLs by mouth at bedtime as needed. (Patient not taking: Reported on 07/26/2015), Disp: 50 mL, Rfl: 0 Allergies  Allergen Reactions  . Penicillins    Review of Systems Noncontributory    Objective:   Physical Exam  Constitutional: She is oriented to person, place, and time. She appears well-developed and well-nourished. No distress.  HENT:  Head: Normocephalic and atraumatic.  Eyes: Pupils are equal, round, and reactive to light.  Neck: Normal range of motion.  Cardiovascular: Normal rate and regular rhythm.   Pulmonary/Chest: Effort normal. No respiratory distress.  Musculoskeletal: Normal range of motion.  Neurological: She is alert and oriented to person, place, and time.  Skin: Skin is warm and dry.  Puncture wound without swelling or redness on the fingerprint pad of the right index finger  Psychiatric: She has a normal mood and affect. Her behavior is normal.  Nursing note and vitals reviewed.   Assessment & Plan:  Exposure to communicable disease - Plan: Hepatitis C antibody, Hepatitis B surface antigen, Hepatitis B surface antibody, HIV antibody  Needlestick wound of finger, left, initial encounter  Given the inability to identify source and have the source tested tonight, we will proceed with her  blood testing and make further arrangements through [place] office communication with them in the morning to make a more complete evaluation.  I have completed the patient encounter in its entirety as documented by the scribe, with editing by me where necessary. Chevette Fee P. Laney Pastor, M.D.

## 2015-07-26 NOTE — Patient Instructions (Signed)
     IF you received an x-ray today, you will receive an invoice from [facility]. Please contact [facility] at [phone] with questions or concerns regarding your invoice.   IF you received labwork today, you will receive an invoice from [facility]. Please contact [facility] at [phone] with questions or concerns regarding your invoice.   Our billing staff will not be able to assist you with questions regarding bills from these companies.  You will be contacted with the lab results as soon as they are available. The fastest way to get your results is to activate your My Chart account. Instructions are located on the last page of this paperwork. If you have not heard from us regarding the results in 2 weeks, please contact this office.      

## 2015-07-26 NOTE — Telephone Encounter (Signed)
Per Dr. Ninfa Meeker request, message left on voicemail to Terrilee Files, school nurse at [employer], regarding finger stick pt received and information needed on source pt. Ms. Damita Lack was asked to call the office on Tues. 07/27/15 with information.

## 2015-07-27 LAB — HEPATITIS C ANTIBODY: HCV Ab: NEGATIVE

## 2015-07-27 LAB — HIV ANTIBODY (ROUTINE TESTING W REFLEX): HIV: NONREACTIVE

## 2015-07-27 LAB — HEPATITIS B SURFACE ANTIBODY, QUANTITATIVE: HEPATITIS B-POST: 0 m[IU]/mL

## 2015-07-27 LAB — HEPATITIS B SURFACE ANTIGEN: Hepatitis B Surface Ag: NEGATIVE

## 2015-07-30 ENCOUNTER — Telehealth: Payer: Self-pay | Admitting: *Deleted

## 2015-07-30 NOTE — Telephone Encounter (Signed)
Patient was given sources blood results.  Patient wanted to know if we can send a copy of his results to the school?  I am unsure of this question.  Please advise Gail [name].

## 2015-08-09 NOTE — Telephone Encounter (Signed)
Please send to the school. Philis Fendt, MS, PA-C 5:04 PM, 08/09/2015

## 2015-09-16 ENCOUNTER — Telehealth: Payer: Self-pay | Admitting: Family Medicine

## 2015-09-16 NOTE — Telephone Encounter (Signed)
I do not see it in epic-let me know if I'm missing it

## 2015-09-16 NOTE — Telephone Encounter (Signed)
Pt called checking on her sleep study results she had done 2 weeks with dr fuller Pt is aware dr tower is out of office till [employer]

## 2015-09-17 NOTE — Telephone Encounter (Signed)
Lm on pt vm and advised sleep study not in chart as of yet. Advised pt to contact office who completed procedure and have them fax over results.

## 2015-09-28 DIAGNOSIS — H2513 Age-related nuclear cataract, bilateral: Secondary | ICD-10-CM | POA: Diagnosis not present

## 2015-09-28 DIAGNOSIS — H02401 Unspecified ptosis of right eyelid: Secondary | ICD-10-CM | POA: Diagnosis not present

## 2015-09-28 DIAGNOSIS — H04123 Dry eye syndrome of bilateral lacrimal glands: Secondary | ICD-10-CM | POA: Diagnosis not present

## 2015-10-03 ENCOUNTER — Other Ambulatory Visit: Payer: Self-pay | Admitting: Family Medicine

## 2015-10-12 DIAGNOSIS — H903 Sensorineural hearing loss, bilateral: Secondary | ICD-10-CM | POA: Diagnosis not present

## 2015-10-27 ENCOUNTER — Telehealth: Payer: Self-pay | Admitting: *Deleted

## 2015-10-27 NOTE — Telephone Encounter (Signed)
Dr. Glori Bickers received records from [place]. Toy Cookey DDS, PA's office about pt's sleep issues. Dr. Glori Bickers wrote a note on it saying:  "I recommend the oral appliance, please let pt know"  Called pt and no answer so left voicemail requesting pt to call the office back

## 2015-10-28 NOTE — Telephone Encounter (Signed)
Pt returned call please call [phone]

## 2015-10-28 NOTE — Telephone Encounter (Signed)
Pt notified of Dr. Tower's comments and verbalized understanding  

## 2015-11-01 DIAGNOSIS — G4733 Obstructive sleep apnea (adult) (pediatric): Secondary | ICD-10-CM | POA: Diagnosis not present

## 2015-11-15 ENCOUNTER — Other Ambulatory Visit: Payer: Self-pay | Admitting: Family Medicine

## 2016-01-25 ENCOUNTER — Encounter: Payer: Self-pay | Admitting: Family Medicine

## 2016-01-25 ENCOUNTER — Ambulatory Visit (INDEPENDENT_AMBULATORY_CARE_PROVIDER_SITE_OTHER): Payer: BLUE CROSS/BLUE SHIELD | Admitting: Family Medicine

## 2016-01-25 VITALS — BP 100/80 | HR 93 | Temp 98.4°F | Ht 65.5 in | Wt 253.1 lb

## 2016-01-25 DIAGNOSIS — J069 Acute upper respiratory infection, unspecified: Secondary | ICD-10-CM

## 2016-01-25 MED ORDER — BENZONATATE 100 MG PO CAPS: 100.0000 mg | ORAL_CAPSULE | Freq: Two times a day (BID) | ORAL | 0 refills | Status: DC | PRN

## 2016-01-25 NOTE — Progress Notes (Signed)
Pre visit review using our clinic review tool, if applicable. No additional management support is needed unless otherwise documented below in the visit note. 

## 2016-01-25 NOTE — Patient Instructions (Signed)
INSTRUCTIONS FOR UPPER RESPIRATORY INFECTION:  -plenty of rest and fluids  -nasal saline wash 2-3 times daily (use prepackaged nasal saline or bottled/distilled water if making your own)   -can use AFRIN nasal spray for drainage and nasal congestion - but do NOT use longer then 3-4 days  -can use tylenol (in no history of liver disease) or ibuprofen (if no history of kidney disease, bowel bleeding or significant heart disease) as directed for aches and sorethroat  -in the winter time, using a humidifier at night is helpful (please follow cleaning instructions)  -if you are taking a cough medication - use only as directed, may also try a teaspoon of honey to coat the throat and throat lozenges.   -for sore throat, salt water gargles can help  -talk in a normal voice if hoarse - no singing, shouting or whispering, warm liquids can soothe the throat  -follow up if you have fevers, facial pain, tooth pain, difficulty breathing or swallowing or are worsening or symptoms persist longer then expected  Upper Respiratory Infection, Adult An upper respiratory infection (URI) is also known as the common cold. It is often caused by a type of germ (virus). Colds are easily spread (contagious). You can pass it to others by kissing, coughing, sneezing, or drinking out of the same glass. Usually, you get better in 1 to 3  weeks.  However, the cough can last for even longer. HOME CARE   Only take medicine as told by your doctor. Follow instructions provided above.  Drink enough water and fluids to keep your pee (urine) clear or pale yellow.  Get plenty of rest.  Return to work when your temperature is < 100 for 24 hours or as told by your doctor. You may use a face mask and wash your hands to stop your cold from spreading. GET HELP RIGHT AWAY IF:   After the first few days, you feel you are getting worse.  You have questions about your medicine.  You have chills, shortness of breath, or red spit  (mucus).  You have pain in the face for more then 1-2 days, especially when you bend forward.  You have a fever, puffy (swollen) neck, pain when you swallow, or white spots in the back of your throat.  You have a bad headache, ear pain, sinus pain, or chest pain.  You have a high-pitched whistling sound when you breathe in and out (wheezing).  You cough up blood.  You have sore muscles or a stiff neck. MAKE SURE YOU:   Understand these instructions.  Will watch your condition.  Will get help right away if you are not doing well or get worse. Document Released: 08/23/2007 Document Revised: 05/29/2011 Document Reviewed: 06/11/2013 [facility] Patient Information 2015 [place], [place]. This information is not intended to replace advice given to you by your health care provider. Make sure you discuss any questions you have with your health care provider.

## 2016-01-25 NOTE — Progress Notes (Signed)
HPI:  Acute visit for URI: -started: about 5-6 days ago -symptoms:nasal congestion, sore throat, PND, hoarseness, cough -denies:fever, SOB, NVD, tooth pain, sinus pain -has tried: nothing -sick contacts/travel/risks: no reported flu, strep or tick exposure -denies hx lung disease  ROS: See pertinent positives and negatives per HPI.  Past Medical History:  Diagnosis Date  . History of allergic rhinitis   . History of anxiety   . History of depression   . History of hyperlipidemia   . History of syncope     Past Surgical History:  Procedure Laterality Date  . CESAREAN SECTION      Family History  Problem Relation Age of Onset  . Diabetes Father   . Diabetes Paternal Grandfather   . Prostate cancer Other     Grandfather  . Diabetes Paternal Grandmother     Social History   Social History  . Marital status: Married    Spouse name: N/A  . Number of children: N/A  . Years of education: N/A   Social History Main Topics  . Smoking status: Never Smoker  . Smokeless tobacco: None  . Alcohol use 0.0 oz/week     Comment: occasional  . Drug use: No  . Sexual activity: Not Asked   Other Topics Concern  . None   Social History Narrative  . None     Current Outpatient Prescriptions:  .  atorvastatin (LIPITOR) 20 MG tablet, Take 1 tablet (20 mg total) by mouth daily., Disp: 90 tablet, Rfl: 3 .  Choline Fenofibrate (FENOFIBRIC ACID) 135 MG CPDR, Take 125 mg by mouth daily., Disp: 90 capsule, Rfl: 3 .  escitalopram (LEXAPRO) 20 MG tablet, TAKE 1 TABLET BY MOUTH EVERY DAY, Disp: 90 tablet, Rfl: 0 .  miglitol (GLYSET) 25 MG tablet, TAKE 1 TABLET (25 MG TOTAL) BY MOUTH 2 (TWO) TIMES DAILY., Disp: 60 tablet, Rfl: 11 .  benzonatate (TESSALON) 100 MG capsule, Take 1 capsule (100 mg total) by mouth 2 (two) times daily as needed for cough., Disp: 20 capsule, Rfl: 0  EXAM:  Vitals:   01/25/16 1452  BP: 100/80  Pulse: 93  Temp: 98.4 F (36.9 C)    Body mass index is  41.48 kg/m.  GENERAL: vitals reviewed and listed above, alert, oriented, appears well hydrated and in no acute distress  HEENT: atraumatic, conjunttiva clear, no obvious abnormalities on inspection of external nose and ears, normal appearance of ear canals and TMs, clear nasal congestion, mild post oropharyngeal erythema with PND, no tonsillar edema or exudate, no sinus TTP  NECK: no obvious masses on inspection  LUNGS: clear to auscultation bilaterally, no wheezes, rales or rhonchi, good air movement  CV: HRRR, no peripheral edema  MS: moves all extremities without noticeable abnormality  PSYCH: pleasant and cooperative, no obvious depression or anxiety  ASSESSMENT AND PLAN:  Discussed the following assessment and plan:  Acute upper respiratory infection  -given HPI and exam findings today, a serious infection or illness is unlikely. We discussed potential etiologies, with VURI being most likely with mild laryngitis, and advised supportive care and monitoring. Prescribed tesaslon for cough after discussion options/risks. We discussed treatment side effects, likely course, antibiotic misuse, transmission, and signs of developing a serious illness. -of course, we advised to return or notify a doctor immediately if symptoms worsen or persist or new concerns arise.    Patient Instructions  INSTRUCTIONS FOR UPPER RESPIRATORY INFECTION:  -plenty of rest and fluids  -nasal saline wash 2-3 times daily (use prepackaged  nasal saline or bottled/distilled water if making your own)   -can use AFRIN nasal spray for drainage and nasal congestion - but do NOT use longer then 3-4 days  -can use tylenol (in no history of liver disease) or ibuprofen (if no history of kidney disease, bowel bleeding or significant heart disease) as directed for aches and sorethroat  -in the winter time, using a humidifier at night is helpful (please follow cleaning instructions)  -if you are taking a cough  medication - use only as directed, may also try a teaspoon of honey to coat the throat and throat lozenges.   -for sore throat, salt water gargles can help  -talk in a normal voice if hoarse - no singing, shouting or whispering, warm liquids can soothe the throat  -follow up if you have fevers, facial pain, tooth pain, difficulty breathing or swallowing or are worsening or symptoms persist longer then expected  Upper Respiratory Infection, Adult An upper respiratory infection (URI) is also known as the common cold. It is often caused by a type of germ (virus). Colds are easily spread (contagious). You can pass it to others by kissing, coughing, sneezing, or drinking out of the same glass. Usually, you get better in 1 to 3  weeks.  However, the cough can last for even longer. HOME CARE   Only take medicine as told by your doctor. Follow instructions provided above.  Drink enough water and fluids to keep your pee (urine) clear or pale yellow.  Get plenty of rest.  Return to work when your temperature is < 100 for 24 hours or as told by your doctor. You may use a face mask and wash your hands to stop your cold from spreading. GET HELP RIGHT AWAY IF:   After the first few days, you feel you are getting worse.  You have questions about your medicine.  You have chills, shortness of breath, or red spit (mucus).  You have pain in the face for more then 1-2 days, especially when you bend forward.  You have a fever, puffy (swollen) neck, pain when you swallow, or white spots in the back of your throat.  You have a bad headache, ear pain, sinus pain, or chest pain.  You have a high-pitched whistling sound when you breathe in and out (wheezing).  You cough up blood.  You have sore muscles or a stiff neck. MAKE SURE YOU:   Understand these instructions.  Will watch your condition.  Will get help right away if you are not doing well or get worse. Document Released: 08/23/2007 Document  Revised: 05/29/2011 Document Reviewed: 06/11/2013 [facility] Patient Information 2015 [place], [place]. This information is not intended to replace advice given to you by your health care provider. Make sure you discuss any questions you have with your health care provider.    Colin Benton R., DO

## 2016-01-25 NOTE — Progress Notes (Signed)
Rx for Benzonatate was sent to [facility] in [place] and I called them and asked Izora Gala that they forward [facility] [place] [facility] the pt.

## 2016-01-28 ENCOUNTER — Other Ambulatory Visit: Payer: Self-pay | Admitting: Family Medicine

## 2016-01-28 NOTE — Telephone Encounter (Signed)
No recent/future appts., please advise (both meds are due for a refill)

## 2016-01-30 MED ORDER — ESCITALOPRAM OXALATE 20 MG PO TABS: 20.0000 mg | ORAL_TABLET | Freq: Every day | ORAL | 1 refills | Status: DC

## 2016-01-30 NOTE — Telephone Encounter (Signed)
Will refill electronically Please schedule PE in the spring

## 2016-01-31 ENCOUNTER — Other Ambulatory Visit: Payer: Self-pay | Admitting: Family Medicine

## 2016-01-31 DIAGNOSIS — Z1231 Encounter for screening mammogram for malignant neoplasm of breast: Secondary | ICD-10-CM

## 2016-01-31 MED ORDER — MIGLITOL 25 MG PO TABS: ORAL_TABLET | ORAL | 1 refills | Status: DC

## 2016-01-31 NOTE — Telephone Encounter (Signed)
Received notification that Rx didn't go through (e-prescribing error), Rx resent

## 2016-02-02 NOTE — Telephone Encounter (Signed)
CPE scheduled  

## 2016-02-18 ENCOUNTER — Ambulatory Visit: Payer: BLUE CROSS/BLUE SHIELD

## 2016-02-22 DIAGNOSIS — R41840 Attention and concentration deficit: Secondary | ICD-10-CM | POA: Diagnosis not present

## 2016-02-22 DIAGNOSIS — F902 Attention-deficit hyperactivity disorder, combined type: Secondary | ICD-10-CM | POA: Diagnosis not present

## 2016-02-22 DIAGNOSIS — Z79899 Other long term (current) drug therapy: Secondary | ICD-10-CM | POA: Diagnosis not present

## 2016-02-22 DIAGNOSIS — F419 Anxiety disorder, unspecified: Secondary | ICD-10-CM | POA: Diagnosis not present

## 2016-03-21 ENCOUNTER — Other Ambulatory Visit: Payer: Self-pay | Admitting: Family Medicine

## 2016-03-30 ENCOUNTER — Other Ambulatory Visit: Payer: Self-pay | Admitting: *Deleted

## 2016-03-30 MED ORDER — ATORVASTATIN CALCIUM 20 MG PO TABS: 20.0000 mg | ORAL_TABLET | Freq: Every day | ORAL | 0 refills | Status: DC

## 2016-04-03 ENCOUNTER — Ambulatory Visit
Admission: RE | Admit: 2016-04-03 | Discharge: 2016-04-03 | Disposition: A | Discharge status: Home / Self Care | Payer: BLUE CROSS/BLUE SHIELD | Source: Ambulatory Visit | Attending: Family Medicine | Admitting: Family Medicine

## 2016-04-03 DIAGNOSIS — Z1231 Encounter for screening mammogram for malignant neoplasm of breast: Secondary | ICD-10-CM

## 2016-04-03 IMAGING — MG 2D DIGITAL SCREENING BILATERAL MAMMOGRAM WITH CAD AND ADJUNCT TO
8 of 12 series · 8 of 28 positions shown · non-contrast
Comparison: Previous exam(s).

CLINICAL DATA: Screening.

EXAM:
2D DIGITAL SCREENING BILATERAL MAMMOGRAM WITH CAD AND ADJUNCT TOMO

[R CC synth-2D]
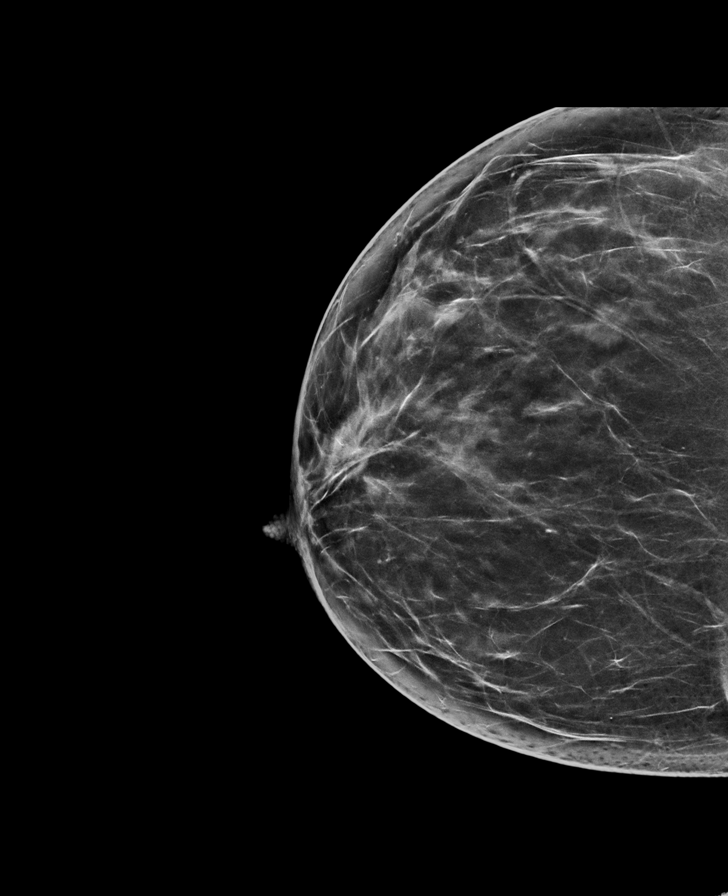

[R CC]
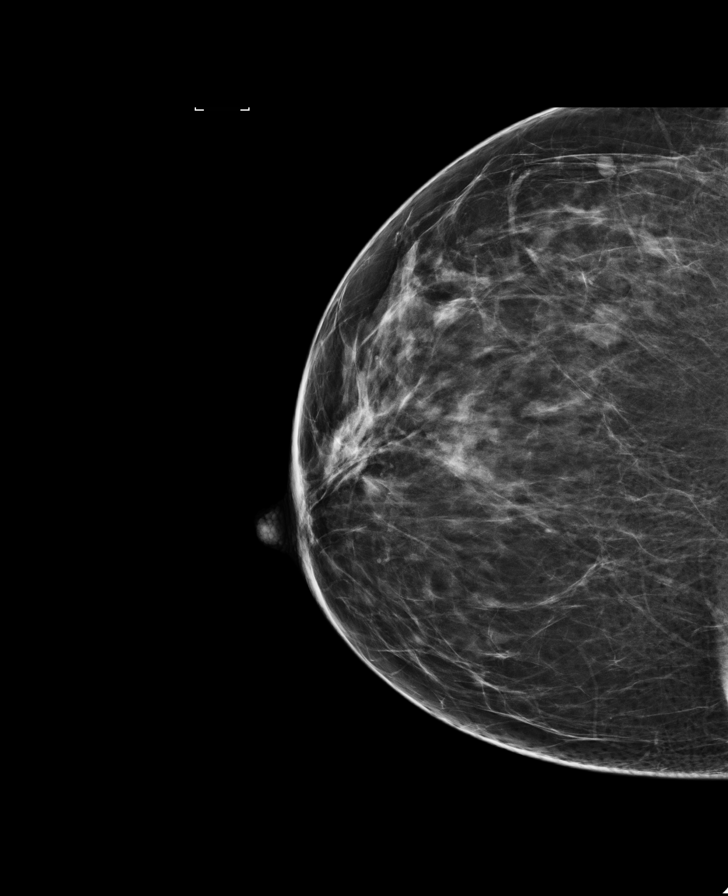

[L MLO]
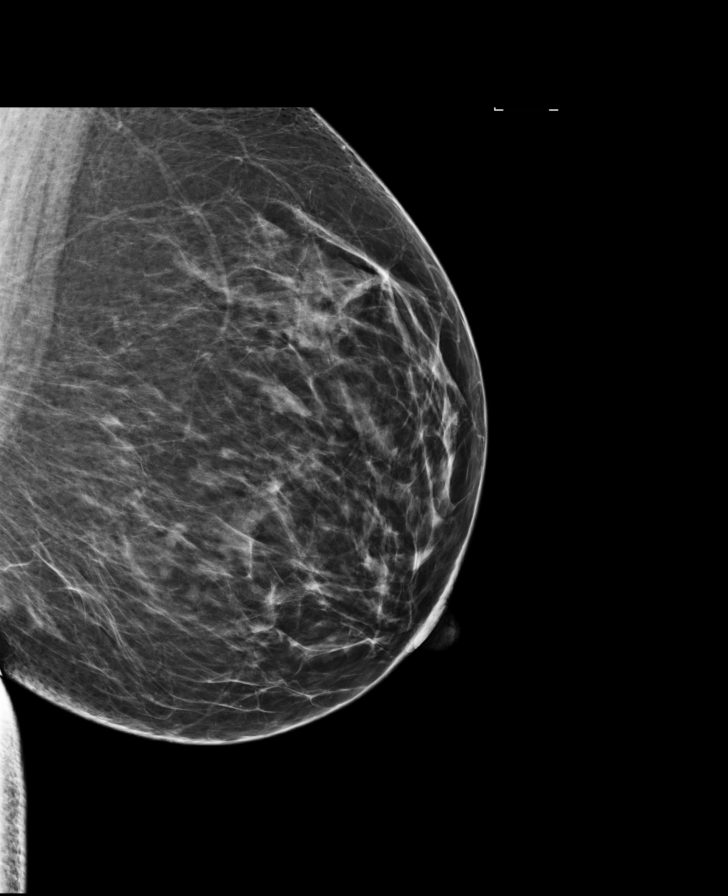

[R MLO synth-2D]
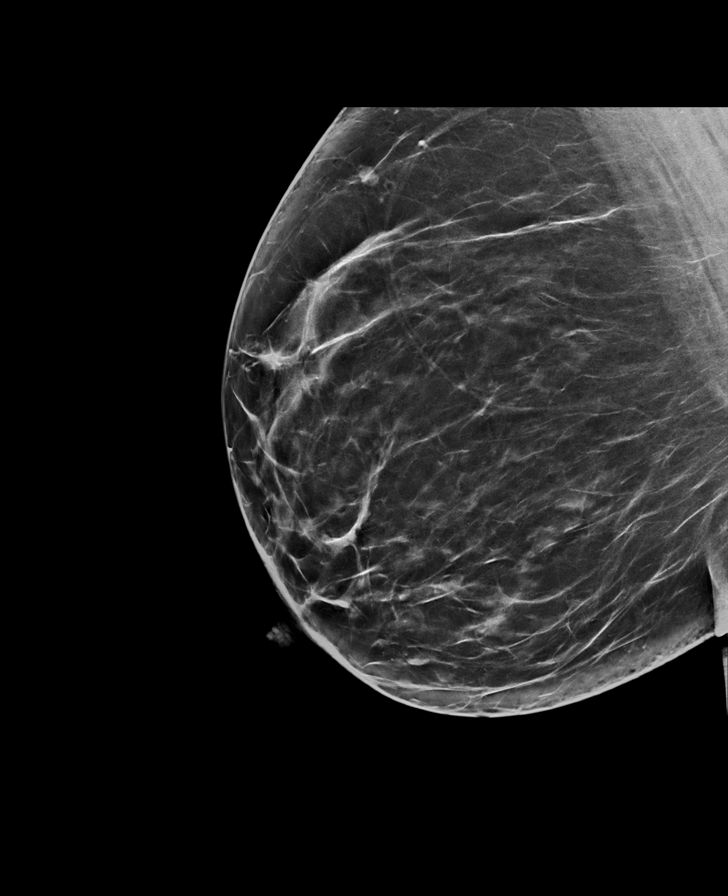

[L MLO synth-2D]
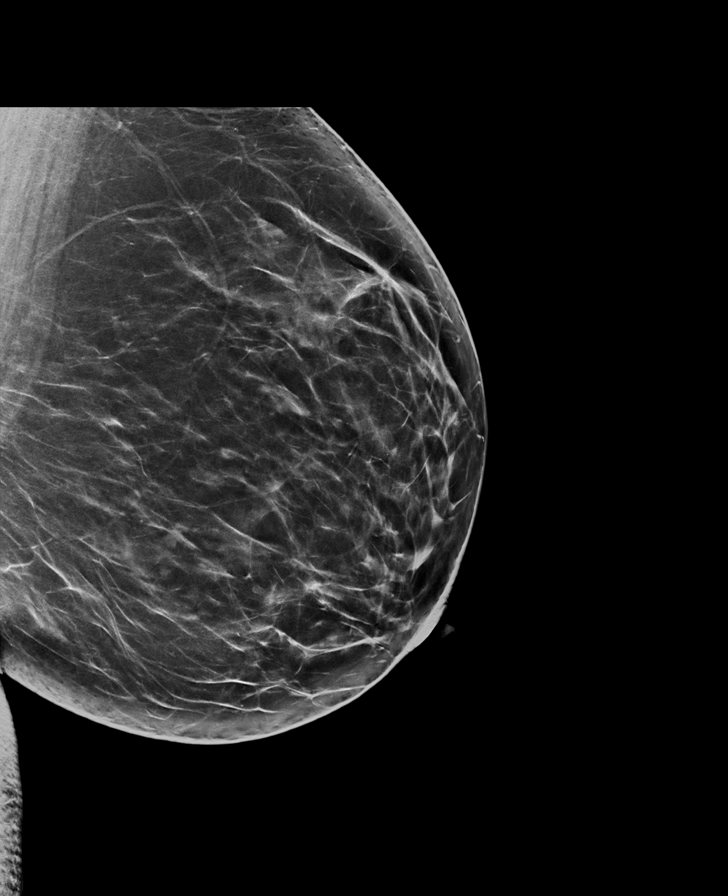

[L CC]
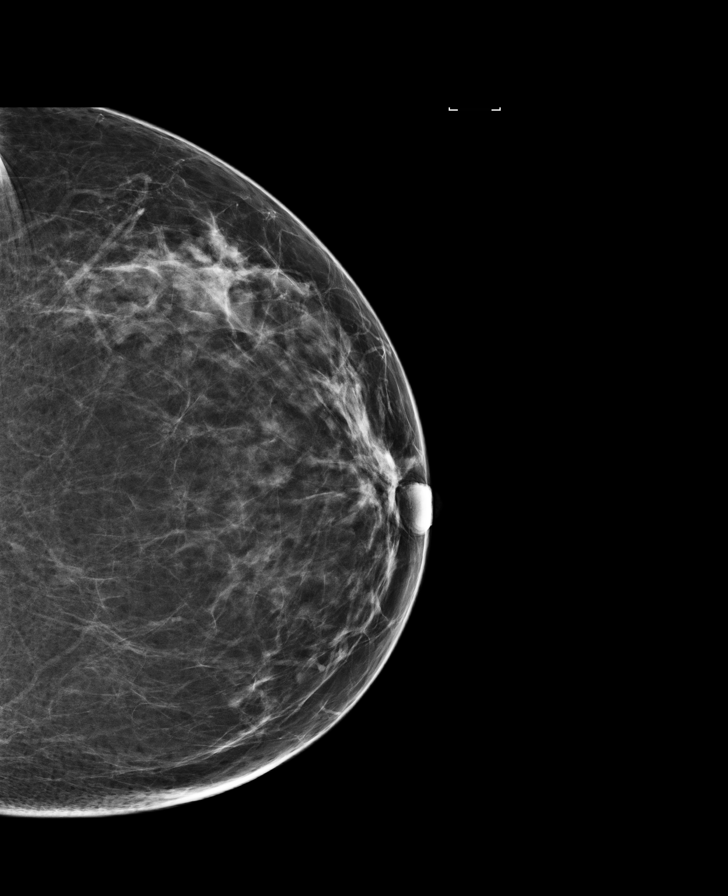

[L CC synth-2D]
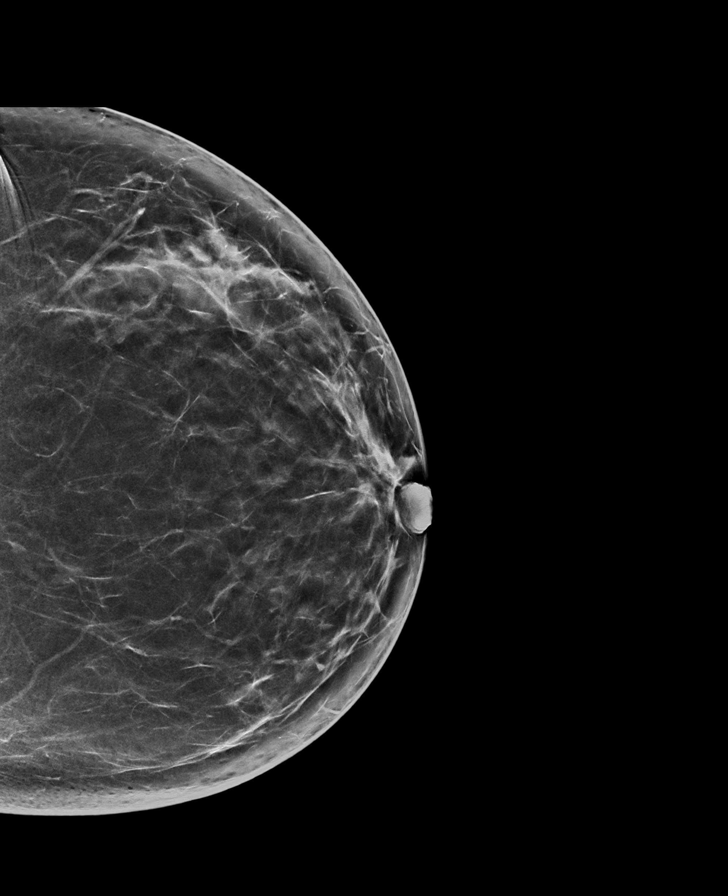

[R MLO]
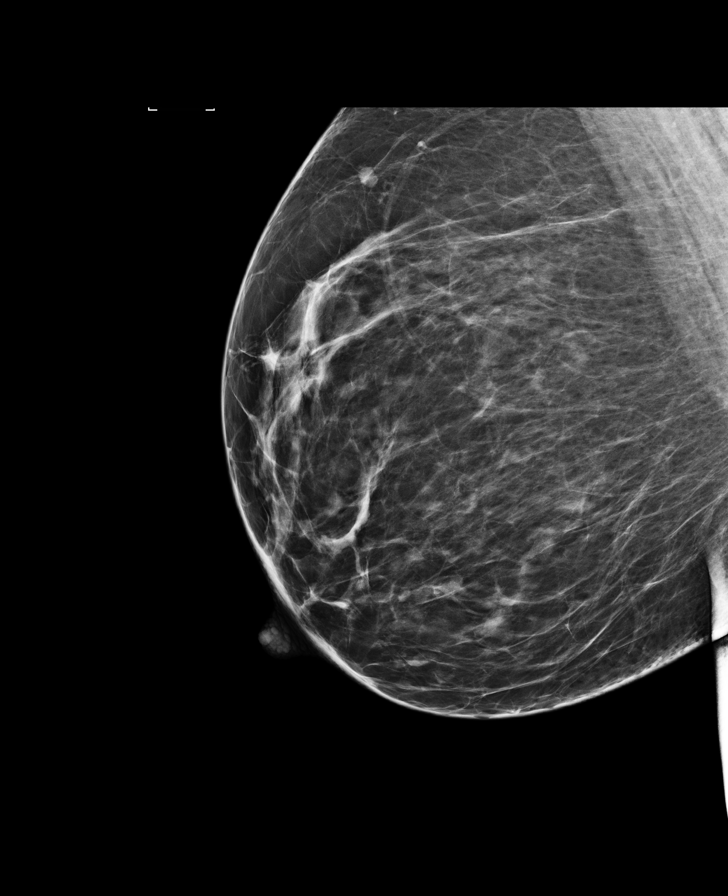

[8 of 28 positions shown; findings below may reference images not displayed]

ACR Breast Density Category b: There are scattered areas of
fibroglandular density.
FINDINGS: There are no findings suspicious for malignancy. Images were
processed with CAD.
IMPRESSION: No mammographic evidence of malignancy. A result letter of this
screening mammogram will be mailed directly to the patient.

RECOMMENDATION:
Screening mammogram in one year. (Code:[33])

BI-RADS CATEGORY  1: Negative.

## 2016-04-04 ENCOUNTER — Encounter: Payer: Self-pay | Admitting: Family Medicine

## 2016-04-04 DIAGNOSIS — H9325 Central auditory processing disorder: Secondary | ICD-10-CM | POA: Insufficient documentation

## 2016-05-01 ENCOUNTER — Other Ambulatory Visit: Payer: Self-pay | Admitting: Family Medicine

## 2016-05-24 ENCOUNTER — Encounter: Payer: BLUE CROSS/BLUE SHIELD | Admitting: Family Medicine

## 2016-05-24 DIAGNOSIS — Z0289 Encounter for other administrative examinations: Secondary | ICD-10-CM

## 2016-05-31 ENCOUNTER — Other Ambulatory Visit: Payer: Self-pay | Admitting: Family Medicine

## 2016-07-03 ENCOUNTER — Telehealth: Payer: Self-pay | Admitting: Family Medicine

## 2016-07-03 NOTE — Telephone Encounter (Signed)
Please schedule a f/u when able and refill until then  

## 2016-07-03 NOTE — Telephone Encounter (Signed)
Received refill request electronically Last office visit 02/23/15/acute Last office visit with Dr. Salvadore Dom showed

## 2016-07-05 NOTE — Telephone Encounter (Signed)
Left voicemail requesting pt to call the office back 

## 2016-07-11 NOTE — Telephone Encounter (Signed)
Patient returned Shapale's call.  Patient scheduled follow up appointment with Dr.Tower on 07/21/16.  Patient said she didn't request the refill.  Patient said please don't call in the medication until after she sees Dr.Tower.

## 2016-07-13 ENCOUNTER — Other Ambulatory Visit: Payer: Self-pay | Admitting: *Deleted

## 2016-07-13 MED ORDER — ATORVASTATIN CALCIUM 20 MG PO TABS: 20.0000 mg | ORAL_TABLET | Freq: Every day | ORAL | 0 refills | Status: DC

## 2016-07-21 ENCOUNTER — Ambulatory Visit (INDEPENDENT_AMBULATORY_CARE_PROVIDER_SITE_OTHER): Payer: BLUE CROSS/BLUE SHIELD | Admitting: Family Medicine

## 2016-07-21 ENCOUNTER — Encounter: Payer: Self-pay | Admitting: Family Medicine

## 2016-07-21 VITALS — BP 122/78 | HR 90 | Temp 98.7°F | Ht 65.5 in | Wt 255.8 lb

## 2016-07-21 DIAGNOSIS — E781 Pure hyperglyceridemia: Secondary | ICD-10-CM

## 2016-07-21 DIAGNOSIS — G4700 Insomnia, unspecified: Secondary | ICD-10-CM | POA: Diagnosis not present

## 2016-07-21 DIAGNOSIS — F411 Generalized anxiety disorder: Secondary | ICD-10-CM

## 2016-07-21 DIAGNOSIS — R739 Hyperglycemia, unspecified: Secondary | ICD-10-CM

## 2016-07-21 MED ORDER — ESCITALOPRAM OXALATE 20 MG PO TABS: 20.0000 mg | ORAL_TABLET | Freq: Every day | ORAL | 3 refills | Status: DC

## 2016-07-21 MED ORDER — FENOFIBRIC ACID 135 MG PO CPDR: 125.0000 mg | DELAYED_RELEASE_CAPSULE | Freq: Every day | ORAL | 3 refills | Status: DC

## 2016-07-21 MED ORDER — ATORVASTATIN CALCIUM 20 MG PO TABS: 20.0000 mg | ORAL_TABLET | Freq: Every day | ORAL | 3 refills | Status: DC

## 2016-07-21 NOTE — Progress Notes (Signed)
Pre visit review using our clinic review tool, if applicable. No additional management support is needed unless otherwise documented below in the visit note. 

## 2016-07-21 NOTE — Patient Instructions (Addendum)
Switch to decaf  Keep bedroom dark and very cool  Try to go to bed at the same time every day  If you cannot sleep-get up and read (a real paper book)  Try 25 mg of benadryl at bedtime   Labs today  Take care of yourself

## 2016-07-21 NOTE — Progress Notes (Signed)
Subjective:    Patient ID: Gail [name], female    DOB: Apr 06, 1970, 46 y.o.   MRN: [id number]  HPI Here for f/u of chronic medical problems   Still teaching  Moved to [place]  Good year at school    Wt Readings from Last 3 Encounters:  07/21/16 255 lb 12 oz (116 kg)  01/25/16 253 lb 1.6 oz (114.8 kg)  07/26/15 254 lb 9.6 oz (115.5 kg)  eating healthy  Not enough exercise - 2 days per week / would like to build up to running / now walking  bmi 41.9  BP Readings from Last 3 Encounters:  07/21/16 122/78  01/25/16 100/80  07/26/15 128/84    Hx of hyperlipiemia Lab Results  Component Value Date   CHOL 152 01/18/2015   HDL 51.60 01/18/2015   LDLCALC 77 01/18/2015   LDLDIRECT 106.4 12/05/2012   TRIG 115.0 01/18/2015   CHOLHDL 3 01/18/2015   Due for labs On atorvastatin and fenofibrate Eating well  No missed doses of medication    Hx of hyperglycemia  Still sees Dr Forde Dandy (she will make a f/u appt)  Would like A1C   Mood - (lexapro)- wants to stay on it  She can tell a difference if she misses a dose  She was also diag OCD  Stress- teenage daughter   Some trouble sleeping  Falling asleep and staying asleep both Caffeine - drinks it at lunch and 2 glasses of tea from 5 to 9   Patient Active Problem List   Diagnosis Date Noted  . Insomnia 07/22/2016  . Auditory processing disorder 04/04/2016  . Encounter for routine gynecological examination 11/27/2014  . Morbid obesity ([place]) 12/13/2012  . Hyperglycemia 12/13/2012  . Other screening mammogram 12/12/2011  . Routine general medical examination at a health care facility 12/04/2011  . HYPERTRIGLYCERIDEMIA 04/07/2008  . PROBLEMS WITH HEARING 04/07/2008  . MAMMOGRAM, ABNORMAL 03/18/2008  . Anxiety disorder 02/07/2008  . ALLERGIC RHINITIS 02/07/2008  . HYPOGLYCEMIA, REACTIVE 11/15/2006   Past Medical History:  Diagnosis Date  . History of allergic rhinitis   . History of anxiety   . History of  depression   . History of hyperlipidemia   . History of syncope    Past Surgical History:  Procedure Laterality Date  . CESAREAN SECTION     Social History  Substance Use Topics  . Smoking status: Never Smoker  . Smokeless tobacco: Never Used  . Alcohol use 0.0 oz/week     Comment: occasional   Family History  Problem Relation Age of Onset  . Diabetes Father   . Diabetes Paternal Grandfather   . Prostate cancer Other     Grandfather  . Diabetes Paternal Grandmother    Allergies  Allergen Reactions  . Penicillins    Current Outpatient Prescriptions on File Prior to Visit  Medication Sig Dispense Refill  . miglitol (GLYSET) 25 MG tablet TAKE 1 TABLET (25 MG TOTAL) BY MOUTH 2 (TWO) TIMES DAILY. 60 tablet 0   No current facility-administered medications on file prior to visit.     Review of Systems Review of Systems  Constitutional: Negative for fever, appetite change, and unexpected weight change. pos for fatigue and difficulty sleeping  Eyes: Negative for pain and visual disturbance.  Respiratory: Negative for cough and shortness of breath.   Cardiovascular: Negative for cp or palpitations    Gastrointestinal: Negative for nausea, diarrhea and constipation.  Genitourinary: Negative for urgency and frequency.  Skin: Negative for pallor  or rash   Neurological: Negative for weakness, light-headedness, numbness and headaches.  Hematological: Negative for adenopathy. Does not bruise/bleed easily.  Psychiatric/Behavioral: Negative for dysphoric mood. The patient is at times, nervous/anxious.         Objective:   Physical Exam  Constitutional: She appears well-developed and well-nourished. No distress.  obese and well appearing   HENT:  Head: Normocephalic and atraumatic.  Mouth/Throat: Oropharynx is clear and moist.  Eyes: Conjunctivae and EOM are normal. Pupils are equal, round, and reactive to light.  Neck: Normal range of motion. Neck supple. No JVD present. Carotid  bruit is not present. No thyromegaly present.  Cardiovascular: Normal rate, regular rhythm, normal heart sounds and intact distal pulses.  Exam reveals no gallop.   Pulmonary/Chest: Effort normal and breath sounds normal. No respiratory distress. She has no wheezes. She has no rales.  No crackles  Abdominal: Soft. Bowel sounds are normal. She exhibits no distension, no abdominal bruit and no mass. There is no tenderness.  Musculoskeletal: She exhibits no edema.  Lymphadenopathy:    She has no cervical adenopathy.  Neurological: She is alert. She has normal reflexes.  Skin: Skin is warm and dry. No rash noted.  Psychiatric: She has a normal mood and affect.          Assessment & Plan:   Problem List Items Addressed This Visit      Other   Anxiety disorder    Pt is doing well with lexapro/stable despite stressors I recommend she continue it  Also recent diag of OCD  Reviewed stressors/ coping techniques/symptoms/ support sources/ tx options and side effects in detail today       Hyperglycemia    Lab today for A1C She seed Dr Forde Dandy disc imp of low glycemic diet and wt loss to prevent DM2       Relevant Orders   Hemoglobin A1c (Completed)   HYPERTRIGLYCERIDEMIA    Labs today on statin and fenofibrate  Diet is fair  Disc goals for lipids and reasons to control them Rev labs with pt  (last check) Rev low sat fat diet in detail       Relevant Medications   atorvastatin (LIPITOR) 20 MG tablet   Choline Fenofibrate (FENOFIBRIC ACID) 135 MG CPDR   Other Relevant Orders   Comprehensive metabolic panel (Completed)   Lipid panel (Completed)   Insomnia    Recently- poss due to stress-problems falling and staying asleep  Disc sleep hygiene  Disc avoidance of caffeine  Exercise -the first half of the day  Reading before bed  Trial of benadryl 25 mg at bedtime  Update if not starting to improve in a week or if worsening        Morbid obesity ([place]) - Primary    Discussed  how this problem influences overall health and the risks it imposes  Reviewed plan for weight loss with lower calorie diet (via better food choices and also portion control or program like weight watchers) and exercise building up to or more than 30 minutes 5 days per week including some aerobic activity

## 2016-07-22 DIAGNOSIS — G4700 Insomnia, unspecified: Secondary | ICD-10-CM | POA: Insufficient documentation

## 2016-07-22 LAB — COMPREHENSIVE METABOLIC PANEL
ALK PHOS: 44 U/L (ref 33–115)
ALT: 12 U/L (ref 6–29)
AST: 15 U/L (ref 10–30)
Albumin: 4.1 g/dL (ref 3.6–5.1)
BILIRUBIN TOTAL: 0.3 mg/dL (ref 0.2–1.2)
BUN: 10 mg/dL (ref 7–25)
CALCIUM: 8.8 mg/dL (ref 8.6–10.2)
CO2: 26 mmol/L (ref 20–31)
Chloride: 105 mmol/L (ref 98–110)
Creat: 0.73 mg/dL (ref 0.50–1.10)
Glucose, Bld: 89 mg/dL (ref 65–99)
POTASSIUM: 4.4 mmol/L (ref 3.5–5.3)
Sodium: 140 mmol/L (ref 135–146)
TOTAL PROTEIN: 6.5 g/dL (ref 6.1–8.1)

## 2016-07-22 LAB — LIPID PANEL
CHOL/HDL RATIO: 2.9 ratio (ref <5.0)
CHOLESTEROL: 135 mg/dL (ref <200)
HDL: 47 mg/dL — ABNORMAL LOW (ref >50)
LDL Cholesterol: 68 mg/dL (ref <100)
Triglycerides: 101 mg/dL (ref <150)
VLDL: 20 mg/dL (ref <30)

## 2016-07-22 LAB — HEMOGLOBIN A1C
Hgb A1c MFr Bld: 6 % — ABNORMAL HIGH (ref <5.7)
MEAN PLASMA GLUCOSE: 126 mg/dL

## 2016-07-22 NOTE — Assessment & Plan Note (Signed)
Lab today for A1C She seed Dr Forde Dandy disc imp of low glycemic diet and wt loss to prevent DM2

## 2016-07-22 NOTE — Assessment & Plan Note (Signed)
Pt is doing well with lexapro/stable despite stressors I recommend she continue it  Also recent diag of OCD  Reviewed stressors/ coping techniques/symptoms/ support sources/ tx options and side effects in detail today

## 2016-07-22 NOTE — Assessment & Plan Note (Signed)
Discussed how this problem influences overall health and the risks it imposes  Reviewed plan for weight loss with lower calorie diet (via better food choices and also portion control or program like weight watchers) and exercise building up to or more than 30 minutes 5 days per week including some aerobic activity    

## 2016-07-22 NOTE — Assessment & Plan Note (Addendum)
Recently- poss due to stress-problems falling and staying asleep  Disc sleep hygiene  Disc avoidance of caffeine  Exercise -the first half of the day  Reading before bed  Trial of benadryl 25 mg at bedtime  Update if not starting to improve in a week or if worsening

## 2016-07-22 NOTE — Assessment & Plan Note (Signed)
Labs today on statin and fenofibrate  Diet is fair  Disc goals for lipids and reasons to control them Rev labs with pt  (last check) Rev low sat fat diet in detail

## 2016-09-27 ENCOUNTER — Other Ambulatory Visit (HOSPITAL_COMMUNITY)
Admission: RE | Admit: 2016-09-27 | Discharge: 2016-09-27 | Disposition: A | Discharge status: Home / Self Care | Payer: BLUE CROSS/BLUE SHIELD | Source: Ambulatory Visit | Attending: Obstetrics and Gynecology | Admitting: Obstetrics and Gynecology

## 2016-09-27 ENCOUNTER — Ambulatory Visit (INDEPENDENT_AMBULATORY_CARE_PROVIDER_SITE_OTHER): Payer: BLUE CROSS/BLUE SHIELD | Admitting: Obstetrics and Gynecology

## 2016-09-27 ENCOUNTER — Encounter: Payer: Self-pay | Admitting: Obstetrics and Gynecology

## 2016-09-27 VITALS — BP 134/82 | HR 68 | Resp 16 | Ht 66.25 in | Wt 255.0 lb

## 2016-09-27 DIAGNOSIS — Z01419 Encounter for gynecological examination (general) (routine) without abnormal findings: Secondary | ICD-10-CM

## 2016-09-27 DIAGNOSIS — N921 Excessive and frequent menstruation with irregular cycle: Secondary | ICD-10-CM

## 2016-09-27 NOTE — Progress Notes (Signed)
46 y.o. G74P0002 Married Caucasian female here as an old-new patient for an annual exam. Patient is also having problems with menstrual cycle. Per patient period lasts for a month; started May 31 ended July 1 Patient has seen Dr. Quincy Simmonds in the past -- Daughter a patient of Dr. Quincy Simmonds.  Menses are irregular.  Had 7 menses in the last 12 months.  Prior to this they were normal.  Last menses from May 31 - July 1.  She has heavy bleeding with pad change every hour with clotting.  No pain.  No hot flashes.   Restless legs.   Voids more often.  One caffeine per day.  Not sleeping well.  Did a sleep study.  Was not able to do adequate eval for sleep apnea.   HgbA1C 6.0.  Sees Dr. Forde Dandy and is on [place].   PCP:   Dr. Alba Cory - [facility] -- in [facility]  Patient's last menstrual period was 08/17/2016.     Period Cycle (Days):  (irregular) Period Duration (Days): 1 month Period Pattern: (!) Irregular Menstrual Flow: Heavy Menstrual Control: Maxi pad Menstrual Control Change Freq (Hours): 2 Dysmenorrhea: None     Sexually active: Yes.    The current method of family planning is vasectomy.    Exercising: No.  The patient does not participate in regular exercise at present. Smoker:  no  Health Maintenance: Pap:  11/27/14 Pap smear is negative and negative HR HPV. -- in EPIC History of abnormal Pap:  no MMG:  04/03/16 BIRADS 1 negative/density b TDaP:  12/12/11  HIV and Hep C: 07/26/15 Negative Screening Labs: discuss today   reports that she has never smoked. She has never used smokeless tobacco. She reports that she drinks alcohol. She reports that she does not use drugs.  Past Medical History:  Diagnosis Date  . Anxiety   . History of allergic rhinitis   . History of anxiety   . History of depression   . History of hyperlipidemia   . History of syncope     Past Surgical History:  Procedure Laterality Date  . CESAREAN SECTION      Current Outpatient Prescriptions  Medication  Sig Dispense Refill  . atorvastatin (LIPITOR) 20 MG tablet Take 1 tablet (20 mg total) by mouth daily. 90 tablet 3  . Choline Fenofibrate (FENOFIBRIC ACID) 135 MG CPDR Take 125 mg by mouth daily. 90 capsule 3  . escitalopram (LEXAPRO) 20 MG tablet Take 1 tablet (20 mg total) by mouth daily. 90 tablet 3  . miglitol (GLYSET) 25 MG tablet TAKE 1 TABLET (25 MG TOTAL) BY MOUTH 2 (TWO) TIMES DAILY. 60 tablet 0   No current facility-administered medications for this visit.     Family History  Problem Relation Age of Onset  . Diabetes Father   . Diabetes Paternal Grandfather   . Prostate cancer Paternal Grandfather   . Diabetes Paternal Grandmother     ROS:  Pertinent items are noted in HPI.  Otherwise, a comprehensive ROS was negative.  Exam:   BP 134/82 (BP Location: Right Arm, Patient Position: Sitting, Cuff Size: Large)   Pulse 68   Resp 16   Ht 5' 6.25" (1.683 m)   Wt 255 lb (115.7 kg)   LMP 08/17/2016   BMI 40.85 kg/m     General appearance: alert, cooperative and appears stated age Head: Normocephalic, without obvious abnormality, atraumatic Neck: no adenopathy, supple, symmetrical, trachea midline and thyroid normal to inspection and palpation Lungs: clear to  auscultation bilaterally Breasts: normal appearance, no masses or tenderness, No nipple retraction or dimpling, No nipple discharge or bleeding, No axillary or supraclavicular adenopathy Heart: regular rate and rhythm Abdomen: obese, soft, non-tender; no masses, no organomegaly Extremities: extremities normal, atraumatic, no cyanosis or edema Skin: Skin color, texture, turgor normal. No rashes or lesions Lymph nodes: Cervical, supraclavicular, and axillary nodes normal. No abnormal inguinal nodes palpated Neurologic: Grossly normal  Pelvic: External genitalia:  no lesions              Urethra:  normal appearing urethra with no masses, tenderness or lesions              Bartholins and Skenes: normal                  Vagina: normal appearing vagina with normal color and discharge, no lesions              Cervix: no lesions              Pap taken: Yes.   Bimanual Exam:  Uterus:  normal size, contour, position, consistency, mobility, non-tender              Adnexa: no mass, fullness, tenderness              Rectal exam: Yes.  .  Confirms.              Anus:  normal sphincter tone, no lesions  Chaperone was present for exam.  Assessment:   Well woman visit with normal exam. Menorrhagia with irregular menses. Sleep disturbance. Diabetes.  Obesity.  Hx Cesarean Section.   Plan: Mammogram screening discussed. Recommended self breast awareness. Pap and HR HPV as above. Guidelines for Calcium, Vitamin D, regular exercise program including cardiovascular and weight bearing exercise. We discussed abnormal uterine bleeding and potential etiologies including anovulation, polyps, fibroids, hyperplasia, and malignancy. Return for sonohysterogram and EMB.  CBC and TSH.  We reviewed potential tx options including POPs, Mirena, Depo Provera, hysterectomy.   More discussion after ultrasound/EMB visit.  She is not bleeding today, so I have not given her an Rx for Provera.  Follow up annually and prn.   After visit summary provided.

## 2016-09-27 NOTE — Patient Instructions (Signed)

## 2016-09-28 ENCOUNTER — Other Ambulatory Visit: Payer: Self-pay | Admitting: Obstetrics and Gynecology

## 2016-09-28 ENCOUNTER — Telehealth: Payer: Self-pay | Admitting: Obstetrics and Gynecology

## 2016-09-28 LAB — CBC
HEMATOCRIT: 22.4 % — AB (ref 34.0–46.6)
Hemoglobin: 6.5 g/dL — CL (ref 11.1–15.9)
MCH: 17.2 pg — ABNORMAL LOW (ref 26.6–33.0)
MCHC: 29 g/dL — ABNORMAL LOW (ref 31.5–35.7)
MCV: 59 fL — AB (ref 79–97)
PLATELETS: 295 10*3/uL (ref 150–379)
RBC: 3.77 x10E6/uL (ref 3.77–5.28)
RDW: 19.2 % — AB (ref 12.3–15.4)
WBC: 6.4 10*3/uL (ref 3.4–10.8)

## 2016-09-28 LAB — TSH: TSH: 1.04 u[IU]/mL (ref 0.450–4.500)

## 2016-09-28 NOTE — Telephone Encounter (Signed)
Phone call to patient in follow up.  Discussion with patient.  She would like to proceed with transfusion.  She is short of breath and, has difficulty going up stairs due to fatigue, and has leg pains.   We talked about risks and benefits of transfusion of red blood cells.  She understands there is risk of transfusion reaction, hemolysis of red blood cells, and transmission of infectious disease.  She wishes to proceed.  Will do type and screen today and then transfuse 2 units tomorrow.

## 2016-09-28 NOTE — Progress Notes (Signed)
Transfusion of 2 units of packed red blood cells ordered for patient.  Risks, benefits, and alternatives have been reviewed by phone as the patient is not physically in the office today.  She was here yesterday for evaluation.

## 2016-09-28 NOTE — Telephone Encounter (Signed)
Spoke with patient, advised as seen below per Dr. Quincy Simmonds. Patient states she is not currently bleeding, reports [facility] for the last 2 weeks and leg cramping.   Scheduled for [facility] with EMB on 7/19 at 9am, with Dr. Quincy Simmonds. Advised to take Motrin 800 mg with food and water one hour before procedure. Brief explanation of procedure provided, questions answered.   Advised patient would review with Dr. Quincy Simmonds and return call with recommendations. Patient verbalizes understanding and is agreeable.   Cc: Karlyne Greenspan

## 2016-09-28 NOTE — Telephone Encounter (Signed)
Spoke with Demi at [facility] MAU. Was advised patient will have to go to 3 rd floor for transfusion, transfusion not done in MAU. Call transferred to [facility], 3rd floor.   Spoke with Denton Ar, asked to clarify with Dr. Quincy Simmonds if patient can go after H&H has been collected or after resulted? This may put patient leaving in early morning hours given time of day and arrival of patient. Diet order?   Reviewed with Dr. Quincy Simmonds, patient can arrive at [facility] early on Friday for transfusion if not an option for lab draw today for type and screen prior to transfusion. May leave once H&H collected. Call results to Dr. Quincy Simmonds if before 5pm, after 5 pm on-call provider. Can have regular diet.  Spoke with [facility] 3rd floor Charge RN, advised as seen above per Dr. Quincy Simmonds. Patient will have to have type and screen while in hospital. Have patient arrive at 8am, check in at [facility] admissions before going to 3rd floor.   Call to patient, patient made aware of plan, questions answered. Patient verbalizes understanding and is agreeable.   Routing to provider for final review. Patient is agreeable to disposition. Will close encounter.

## 2016-09-28 NOTE — Telephone Encounter (Signed)
Phone call placed to patient regarding lab results.   No details left.  I asked for he patient to return my call today.   She has menmetrorrhagia.  Hgb yesterday 6.5/Hct 22.4.  TSH normal.  She was not bleeding yesterday, so I did not give her any medication.  I recommended sonohysterogram and EMB for next week.  I already placed orders, so will need to schedule this.  We need to assess her anemia and tolerance of this low number.  She was not tachycardic yesterday.   If she is not having HA, chest discomfort, shortness of breath, dizziness, then I can just have her start iron sulfate, 325 mg po tid with orange juice.  If she is symptomatic, she may need transfusion of packed red blood cells.   Cc- Lamont Snowball

## 2016-09-28 NOTE — Telephone Encounter (Signed)
Spoke with [place], Yulee Stay, no availability until Tuesday for transfusion. Was advised to call Patient Cumby at [facility], [phone].   Spoke with Andria Frames at [facility], was advised no availability for transfusion until Monday. Advised will review with provider.

## 2016-09-28 NOTE — Telephone Encounter (Signed)
I would prefer to have the patient seen at [facility] for a transfusion of the 2 units of packed red blood cells today or tomorrow.

## 2016-09-29 ENCOUNTER — Encounter (HOSPITAL_COMMUNITY): Payer: Self-pay | Admitting: Anesthesiology

## 2016-09-29 ENCOUNTER — Observation Stay (HOSPITAL_COMMUNITY)
Admission: AD | Admit: 2016-09-29 | Discharge: 2016-09-29 | Disposition: A | Discharge status: Home / Self Care | Payer: BLUE CROSS/BLUE SHIELD | Source: Ambulatory Visit | Attending: Obstetrics and Gynecology | Admitting: Obstetrics and Gynecology

## 2016-09-29 ENCOUNTER — Encounter (HOSPITAL_COMMUNITY): Payer: Self-pay | Admitting: *Deleted

## 2016-09-29 DIAGNOSIS — R5383 Other fatigue: Secondary | ICD-10-CM | POA: Insufficient documentation

## 2016-09-29 DIAGNOSIS — Z79899 Other long term (current) drug therapy: Secondary | ICD-10-CM | POA: Insufficient documentation

## 2016-09-29 DIAGNOSIS — N921 Excessive and frequent menstruation with irregular cycle: Principal | ICD-10-CM | POA: Insufficient documentation

## 2016-09-29 DIAGNOSIS — R252 Cramp and spasm: Secondary | ICD-10-CM | POA: Insufficient documentation

## 2016-09-29 DIAGNOSIS — E119 Type 2 diabetes mellitus without complications: Secondary | ICD-10-CM | POA: Diagnosis not present

## 2016-09-29 DIAGNOSIS — D509 Iron deficiency anemia, unspecified: Secondary | ICD-10-CM

## 2016-09-29 DIAGNOSIS — D500 Iron deficiency anemia secondary to blood loss (chronic): Secondary | ICD-10-CM | POA: Diagnosis not present

## 2016-09-29 DIAGNOSIS — R0602 Shortness of breath: Secondary | ICD-10-CM | POA: Insufficient documentation

## 2016-09-29 DIAGNOSIS — N938 Other specified abnormal uterine and vaginal bleeding: Secondary | ICD-10-CM | POA: Diagnosis present

## 2016-09-29 LAB — ABO/RH: ABO/RH(D): O NEG

## 2016-09-29 LAB — PREPARE RBC (CROSSMATCH)

## 2016-09-29 LAB — HEMOGLOBIN AND HEMATOCRIT, BLOOD
HEMATOCRIT: 22.6 % — AB (ref 36.0–46.0)
HEMATOCRIT: 28.4 % — AB (ref 36.0–46.0)
Hemoglobin: 6.5 g/dL — CL (ref 12.0–15.0)
Hemoglobin: 8.6 g/dL — ABNORMAL LOW (ref 12.0–15.0)

## 2016-09-29 LAB — CYTOLOGY - PAP
DIAGNOSIS: NEGATIVE
HPV (WINDOPATH): NOT DETECTED

## 2016-09-29 MED ORDER — DIPHENHYDRAMINE HCL 25 MG PO CAPS
25.0000 mg | ORAL_CAPSULE | Freq: Once | ORAL | Status: AC
  Administered 2016-09-29: 25 mg via ORAL
  Filled 2016-09-29: qty 1

## 2016-09-29 MED ORDER — SODIUM CHLORIDE 0.9 % IV SOLN
Freq: Once | INTRAVENOUS | Status: AC
  Administered 2016-09-29: 12:00:00 via INTRAVENOUS

## 2016-09-29 MED ORDER — ACETAMINOPHEN 325 MG PO TABS
650.0000 mg | ORAL_TABLET | Freq: Once | ORAL | Status: AC
  Administered 2016-09-29: 650 mg via ORAL
  Filled 2016-09-29: qty 2

## 2016-09-29 NOTE — Progress Notes (Signed)
Pt Post transfusion results HGB 8.6. Notified Dr Lynnae Sandhoff to d/c patient home. V/s stable. No s/s of shortnessness of breath or dizziness.

## 2016-09-29 NOTE — Progress Notes (Signed)
Brief Hospital Transfusion Note  46 year old Caucasian female who presented with irregular and heavy vaginal bleeding from the end of May until July 1.  When seen in the office this week, had CBC checked and Hgb noted to be 6.5.  Patient transfused for symptoms of fatigue, leg cramping, and shortness of breath.  Is already feeling better now.   Vitals:   09/29/16 1500 09/29/16 1542  BP: 129/64 129/85  Pulse: 92 92  Resp: 20 20  Temp: 98.5 F (36.9 C) 98.1 F (36.7 C)    Assessment Status post 2 units of packed red blood cells.   Plan Will check Hgb/Hct in 2 hours and plan for discharge if the hemoglobin is rising.  Follow up in 6 days for sonohysterogram/EMB/CBC. Call for heavy vaginal bleeding.   Dr. Talbert Nan covering this evening and weekend.

## 2016-09-30 DIAGNOSIS — D509 Iron deficiency anemia, unspecified: Secondary | ICD-10-CM

## 2016-09-30 LAB — TYPE AND SCREEN
ABO/RH(D): O NEG
ANTIBODY SCREEN: NEGATIVE
UNIT DIVISION: 0
Unit division: 0

## 2016-09-30 LAB — BPAM RBC
BLOOD PRODUCT EXPIRATION DATE: 201807302359
Blood Product Expiration Date: 201807302359
ISSUE DATE / TIME: 201807131503
ISSUE DATE / TIME: 201807131211
UNIT TYPE AND RH: 9500
UNIT TYPE AND RH: 9500

## 2016-10-01 NOTE — H&P (Signed)
Office Visit   09/27/2016 [place] Silva, Everardo All, MD  Obstetrics and Gynecology   Well woman exam with routine gynecological exam +1 more  Dx   Gynecologic Exam ; Referred by Tower, Wynelle Fanny, MD  Reason for Visit   Additional Documentation   Vitals:   BP 134/82 (BP Location: Right Arm, Patient Position: Sitting, Cuff Size: Large)   Pulse 68   Resp 16   Ht 5' 6.25" (1.683 m)   Wt 255 lb (115.7 kg)   LMP 08/17/2016   BMI 40.85 kg/m   BSA 2.33 m   Flowsheets:   Infectious Disease Screening,   Custom Formula Data,   MEWS Score,   Anthropometrics,   Menstrual History     Encounter Info:   Billing Info,   History,   Allergies,   Detailed Report     All Notes   Progress Notes by Nunzio Cobbs, MD at 09/27/2016 2:00 PM   Author: Nunzio Cobbs, MD Author Type: Physician Filed: 09/27/2016 7:26 PM  Note Status: Signed Cosign: Cosign Not Required Encounter Date: 09/27/2016  Editor: Nunzio Cobbs, MD (Physician)  Prior Versions: 1. Archie Balboa, CMA (Certified Psychologist, sport and exercise) at 09/27/2016 2:25 PM - Sign at close encounter    46 y.o. G47P0002 Married Caucasian female here as an old-new patient for an annual exam. Patient is also having problems with menstrual cycle. Per patient period lasts for a month; started May 31 ended July 1 Patient has seen Dr. Quincy Simmonds in the past -- Daughter a patient of Dr. Quincy Simmonds.  Menses are irregular.  Had 7 menses in the last 12 months.  Prior to this they were normal.  Last menses from May 31 - July 1.  She has heavy bleeding with pad change every hour with clotting.  No pain.  No hot flashes.   Restless legs.   Voids more often.  One caffeine per day.  Not sleeping well.  Did a sleep study.  Was not able to do adequate eval for sleep apnea.   HgbA1C 6.0.  Sees Dr. Forde Dandy and is on [place].   PCP:   Dr. Alba Cory - [facility] -- in [facility]  Patient's last  menstrual period was 08/17/2016.     Period Cycle (Days):  (irregular) Period Duration (Days): 1 month Period Pattern: (!) Irregular Menstrual Flow: Heavy Menstrual Control: Maxi pad Menstrual Control Change Freq (Hours): 2 Dysmenorrhea: None     Sexually active: Yes.    The current method of family planning is vasectomy.    Exercising: No.  The patient does not participate in regular exercise at present. Smoker:  no  Health Maintenance: Pap:  11/27/14 Pap smear is negative and negative HR HPV. -- in EPIC History of abnormal Pap:  no MMG:  04/03/16 BIRADS 1 negative/density b TDaP:  12/12/11  HIV and Hep C: 07/26/15 Negative Screening Labs: discuss today   reports that she has never smoked. She has never used smokeless tobacco. She reports that she drinks alcohol. She reports that she does not use drugs.      Past Medical History:  Diagnosis Date  . Anxiety   . History of allergic rhinitis   . History of anxiety   . History of depression   . History of hyperlipidemia   . History of syncope          Past Surgical History:  Procedure Laterality Date  .  CESAREAN SECTION            Current Outpatient Prescriptions  Medication Sig Dispense Refill  . atorvastatin (LIPITOR) 20 MG tablet Take 1 tablet (20 mg total) by mouth daily. 90 tablet 3  . Choline Fenofibrate (FENOFIBRIC ACID) 135 MG CPDR Take 125 mg by mouth daily. 90 capsule 3  . escitalopram (LEXAPRO) 20 MG tablet Take 1 tablet (20 mg total) by mouth daily. 90 tablet 3  . miglitol (GLYSET) 25 MG tablet TAKE 1 TABLET (25 MG TOTAL) BY MOUTH 2 (TWO) TIMES DAILY. 60 tablet 0   No current facility-administered medications for this visit.          Family History  Problem Relation Age of Onset  . Diabetes Father   . Diabetes Paternal Grandfather   . Prostate cancer Paternal Grandfather   . Diabetes Paternal Grandmother     ROS:  Pertinent items are noted in HPI.  Otherwise, a comprehensive ROS  was negative.  Exam:   BP 134/82 (BP Location: Right Arm, Patient Position: Sitting, Cuff Size: Large)   Pulse 68   Resp 16   Ht 5' 6.25" (1.683 m)   Wt 255 lb (115.7 kg)   LMP 08/17/2016   BMI 40.85 kg/m     General appearance: alert, cooperative and appears stated age Head: Normocephalic, without obvious abnormality, atraumatic Neck: no adenopathy, supple, symmetrical, trachea midline and thyroid normal to inspection and palpation Lungs: clear to auscultation bilaterally Breasts: normal appearance, no masses or tenderness, No nipple retraction or dimpling, No nipple discharge or bleeding, No axillary or supraclavicular adenopathy Heart: regular rate and rhythm Abdomen: obese, soft, non-tender; no masses, no organomegaly Extremities: extremities normal, atraumatic, no cyanosis or edema Skin: Skin color, texture, turgor normal. No rashes or lesions Lymph nodes: Cervical, supraclavicular, and axillary nodes normal. No abnormal inguinal nodes palpated Neurologic: Grossly normal  Pelvic: External genitalia:  no lesions              Urethra:  normal appearing urethra with no masses, tenderness or lesions              Bartholins and Skenes: normal                 Vagina: normal appearing vagina with normal color and discharge, no lesions              Cervix: no lesions              Pap taken: Yes.   Bimanual Exam:  Uterus:  normal size, contour, position, consistency, mobility, non-tender              Adnexa: no mass, fullness, tenderness              Rectal exam: Yes.  .  Confirms.              Anus:  normal sphincter tone, no lesions  Chaperone was present for exam.  Assessment:    Well woman visit with normal exam. Menorrhagia with irregular menses. Sleep disturbance. Diabetes.  Obesity.  Hx Cesarean Section.   Plan: Mammogram screening discussed. Recommended self breast awareness. Pap and HR HPV as above. Guidelines for Calcium, Vitamin D, regular exercise program  including cardiovascular and weight bearing exercise. We discussed abnormal uterine bleeding and potential etiologies including anovulation, polyps, fibroids, hyperplasia, and malignancy. Return for sonohysterogram and EMB.  CBC and TSH.  We reviewed potential tx options including POPs, Mirena, Depo Provera, hysterectomy.   More  discussion after ultrasound/EMB visit.  She is not bleeding today, so I have not given her an Rx for Provera.  Follow up annually and prn.   After visit summary provided.        Addendum On routine labs, Hgb was noted to be 6.5.  By phone patient reported feeling shortness of breath in going up stairs and having leg discomfort.  We discussed options of iron therapy and close observation versus transfusion with 2 units of packed red blood cells.  She opted for transfusion after risks and benefits were reviewed.  She will complete her sonohysterogram and endometrial biopsy on 10/05/16.

## 2016-10-02 ENCOUNTER — Telehealth: Payer: Self-pay | Admitting: *Deleted

## 2016-10-02 NOTE — Telephone Encounter (Signed)
Call transferred from [place]. Was advised patient received call from [facility], no open phone note.  Spoke with patient. Blood transfusion on 09/29/16. Reports still feeling weak and fatigued, SHOB and leg cramps have resolved. Denies any vaginal bleeding. Has appointment for [facility] & EMB on 10/05/16 with Dr. Quincy Simmonds. Advised patient RN would update Dr. Quincy Simmonds and return call with any additional recommendations, patient is agreeable.    Dr. Quincy Simmonds -please review.

## 2016-10-02 NOTE — Telephone Encounter (Signed)
Spoke with patient, advised as seen below per Dr. Quincy Simmonds. Patient thankful for f/u, verbalizes understanding and is agreeable.   Patient is agreeable to disposition. Will close encounter.

## 2016-10-02 NOTE — Telephone Encounter (Signed)
I recommend she take iron sulfate 325 mg po tid with orange juice.  She may need a stool softener like Colace 100 mg po q hs to reduce constipation from the iron.  She can also eat foods rich in iron like red meat and spinach.   I will see her this week for her ultrasound visit!

## 2016-10-05 ENCOUNTER — Other Ambulatory Visit: Payer: Self-pay | Admitting: Obstetrics and Gynecology

## 2016-10-05 ENCOUNTER — Encounter: Payer: Self-pay | Admitting: Obstetrics and Gynecology

## 2016-10-05 ENCOUNTER — Ambulatory Visit (INDEPENDENT_AMBULATORY_CARE_PROVIDER_SITE_OTHER): Payer: BLUE CROSS/BLUE SHIELD | Admitting: Obstetrics and Gynecology

## 2016-10-05 ENCOUNTER — Ambulatory Visit (INDEPENDENT_AMBULATORY_CARE_PROVIDER_SITE_OTHER): Payer: BLUE CROSS/BLUE SHIELD

## 2016-10-05 VITALS — BP 134/86 | HR 84 | Resp 16 | Ht 65.0 in | Wt 255.0 lb

## 2016-10-05 DIAGNOSIS — N83201 Unspecified ovarian cyst, right side: Secondary | ICD-10-CM

## 2016-10-05 DIAGNOSIS — N921 Excessive and frequent menstruation with irregular cycle: Secondary | ICD-10-CM

## 2016-10-05 NOTE — Patient Instructions (Signed)

## 2016-10-05 NOTE — Progress Notes (Signed)
Encounter reviewed by Dr. Amariah Kierstead Amundson C. Silva.  

## 2016-10-05 NOTE — Progress Notes (Signed)
Opened in error

## 2016-10-05 NOTE — Progress Notes (Signed)
GYNECOLOGY  VISIT   HPI: 46 y.o.   Married  Caucasian  female   G2P0002 with Patient's last menstrual period was 08/18/2016.   here for   US/sonohysterogram/EMB for menorrhagia with irregular menses.  Expecting menses in 2 weeks.   Just had transfusion of 2 units RBCs for Hgb of 6.5 and is feeling much better. Her post transfusion Hgb was 8.6.  Has completed childbearing and is considering hysterectomy.  GYNECOLOGIC HISTORY: Patient's last menstrual period was 08/18/2016. Contraception:  Vasectomy.  Menopausal hormone therapy:  NA Last mammogram:  03/24/16 - BIRADS 1 neg/density B Last pap smear:   09/27/16 - normal, HR HPV negative        OB History    Gravida Para Term Preterm AB Living   2 2 0 0 0 2   SAB TAB Ectopic Multiple Live Births   0 0 0 0 0         Patient Active Problem List   Diagnosis Date Noted  . Iron deficiency anemia 09/30/2016  . Insomnia 07/22/2016  . Auditory processing disorder 04/04/2016  . Encounter for routine gynecological examination 11/27/2014  . Morbid obesity ([place]) 12/13/2012  . Hyperglycemia 12/13/2012  . Other screening mammogram 12/12/2011  . Routine general medical examination at a health care facility 12/04/2011  . HYPERTRIGLYCERIDEMIA 04/07/2008  . PROBLEMS WITH HEARING 04/07/2008  . MAMMOGRAM, ABNORMAL 03/18/2008  . Anxiety disorder 02/07/2008  . ALLERGIC RHINITIS 02/07/2008  . HYPOGLYCEMIA, REACTIVE 11/15/2006    Past Medical History:  Diagnosis Date  . Anxiety   . History of allergic rhinitis   . History of anxiety   . History of depression   . History of hyperlipidemia   . History of syncope     Past Surgical History:  Procedure Laterality Date  . CESAREAN SECTION      Current Outpatient Prescriptions  Medication Sig Dispense Refill  . atorvastatin (LIPITOR) 20 MG tablet Take 1 tablet (20 mg total) by mouth daily. 90 tablet 3  . Choline Fenofibrate (FENOFIBRIC ACID) 135 MG CPDR Take 125 mg by mouth daily. 90  capsule 3  . escitalopram (LEXAPRO) 20 MG tablet Take 1 tablet (20 mg total) by mouth daily. 90 tablet 3  . miglitol (GLYSET) 25 MG tablet TAKE 1 TABLET (25 MG TOTAL) BY MOUTH 2 (TWO) TIMES DAILY. (Patient taking differently: TAKE 1 TABLET (25 MG TOTAL) BY MOUTHDAILY.) 60 tablet 0   No current facility-administered medications for this visit.      ALLERGIES: Penicillins  Family History  Problem Relation Age of Onset  . Diabetes Father   . Diabetes Paternal Grandfather   . Prostate cancer Paternal Grandfather   . Diabetes Paternal Grandmother     Social History   Social History  . Marital status: Married    Spouse name: N/A  . Number of children: N/A  . Years of education: N/A   Occupational History  . Not on file.   Social History Main Topics  . Smoking status: Never Smoker  . Smokeless tobacco: Never Used  . Alcohol use 0.0 oz/week     Comment: occasional  . Drug use: No  . Sexual activity: Yes    Birth control/ protection: Surgical     Comment: Vasectomy   Other Topics Concern  . Not on file   Social History Narrative  . No narrative on file    ROS:  Pertinent items are noted in HPI.  PHYSICAL EXAMINATION:    BP 134/86 (BP Location: Right  Arm, Patient Position: Sitting, Cuff Size: Large)   Pulse 84   Resp 16   Ht 5\' 5"  (1.651 m)   Wt 255 lb (115.7 kg)   LMP 08/18/2016   BMI 42.43 kg/m     General appearance: alert, cooperative and appears stated age  Pelvic ultrasound: Uterus no masses.  EMS 10.41 mm. Right ovarian simple cyst 43 x 30 mm.  Normal left ovary.  No free fluid.  Sonohysterogram/EMB Consent for procedures.  Sterile prep with Hibiclens.  Tenaculum to anterior cervical lip.  NS injected through sterile cannula.  No obvious endometrial masses.  Somewhat difficult to have good images.   EMB performed. Sterile prep with Hibiclens.  Tenaculum to anterior cervical lip.  Pipelle passed x 2 to 8 cm.  Tissue to pathology.  Minimal  EBL.  No complications.   Chaperone was present for exam.  ASSESSMENT  Menorrhagia with irregular menses.  I suspect this is anovulatory bleeding.  Anemia.  Status post transfusion of 2 units pRBCs.  Status post C/S x 2.  PLAN  Follow up EMB.  Instructions/precautions given.  I discussed options for care:  Micronor, Depo Provera, Mirena IUD, laparoscopic hysterectomy.  We discussed Mirena in detail as well as hysterectomy.  Potential risks of hysterectomy reviewed which include but are not limited to bleeding, infection, damage to surrounding organs, reaction to anesthesia, DVT, PE, death, and reoperation.  Information on Mirena and hysterectomy to patient.  Recheck CBC today.  An After Visit Summary was printed and given to the patient.  __15____ minutes face to face time of which over 50% was spent in counseling.

## 2016-10-06 LAB — CBC
Hematocrit: 30.1 % — ABNORMAL LOW (ref 34.0–46.6)
Hemoglobin: 9.5 g/dL — ABNORMAL LOW (ref 11.1–15.9)
MCH: 19.5 pg — AB (ref 26.6–33.0)
MCHC: 31.6 g/dL (ref 31.5–35.7)
MCV: 62 fL — AB (ref 79–97)
PLATELETS: 289 10*3/uL (ref 150–379)
RBC: 4.86 x10E6/uL (ref 3.77–5.28)
RDW: 25 % — AB (ref 12.3–15.4)
WBC: 6.2 10*3/uL (ref 3.4–10.8)

## 2016-10-09 ENCOUNTER — Telehealth: Payer: Self-pay | Admitting: Obstetrics and Gynecology

## 2016-10-09 MED ORDER — NORETHINDRONE 0.35 MG PO TABS: 1.0000 | ORAL_TABLET | Freq: Every day | ORAL | 0 refills | Status: DC

## 2016-10-09 NOTE — Telephone Encounter (Signed)
Return call to Jill. °

## 2016-10-09 NOTE — Telephone Encounter (Signed)
Spoke with patient, advised as seen below per Dr. Quincy Simmonds. Rx for Micronor to verified pharmacy on file. Scheduled for OV on 9/26 at 3:30pm with Dr. Quincy Simmonds. Patient verbalizes understanding and is agreeable.  Patient is agreeable to disposition. Will close encounter.

## 2016-10-09 NOTE — Telephone Encounter (Signed)
Left message to call Huntley Knoop at [phone].  

## 2016-10-09 NOTE — Telephone Encounter (Signed)
Spoke with patient, advised of results and recommendations as seen below per Dr. Quincy Simmonds. Menses started 10/07/16, started changing regular pad q2 hours on 7/22 with small clots. Denies SHOB, weakness, lightheadedness, HA.   Would like to try Micronor for now, eventually a hysterectomy. 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 Agreeable to recommendations from Dr. Quincy Simmonds for moving forward with hysterectomy at later date. Advised patient would update and review with Dr. Quincy Simmonds and return call. Patient verbalizes understanidng and is agreeable.   Dr. Quincy Simmonds, please review and advise?      Notes recorded by Nunzio Cobbs, MD on 10/06/2016 at 4:39 PM EDT Please report results to patient.  Her EMB showed benign proliferative endometrium with focal glandular crowding.  No malignancy was seen.  It looks like this is from lack of ovulation.   Her CBC shows her hemoglobin is 9.5, which is improving.   Options for care as we discussed are (in no particular order): Mirena IUD, Micronor, Depo Provera, and laparoscopic hysterectomy.  She was going to think about her options and communicate back to me her choice.

## 2016-10-09 NOTE — Telephone Encounter (Signed)
Patient returning your call.

## 2016-10-09 NOTE — Telephone Encounter (Signed)
Ok for Micronor x 3 packs.  Reinforce that she needs to take each pill at the same time every day.  There are no placebo pills at the end of the pack.  Please schedule a recheck appointment with me with a lab visit in 2 months.  We will need to recheck her hemoglobin again.

## 2016-10-09 NOTE — Telephone Encounter (Signed)
Spoke with patient, unable to talk right now, will return call.

## 2016-10-09 NOTE — Telephone Encounter (Signed)
Patient wanted to let Dr Quincy Simmonds know her period came on Saturday.

## 2016-10-11 ENCOUNTER — Telehealth: Payer: Self-pay | Admitting: Obstetrics and Gynecology

## 2016-10-11 MED ORDER — NORETHINDRONE ACETATE 5 MG PO TABS: 5.0000 mg | ORAL_TABLET | Freq: Two times a day (BID) | ORAL | 1 refills | Status: DC

## 2016-10-11 NOTE — Telephone Encounter (Signed)
Patient had a blood transfusion 2 units of RBCs on 09/29/2016 for Hgb of 6.5. Hgb was 8.6 after transfusion. On 10/05/16 Hgb was 9.5. Patient states she started her menses on 10/07/2016. Changing her tampon every 2 hours. Passing around 5 clots per day. Is starting to feel increasingly fatigued like when her Hgb got very low. Denies SOB, dizziness, or weakness. Patient called in on 10/09/16 and was advised to start Micronor daily. Patient has not yet started Micronor stating she was advised to start the Sunday following her menses. Patient leaves for [place] on 10/13/16. Advised will review with Dr.Silva and return call. Patient is agreeable.

## 2016-10-11 NOTE — Telephone Encounter (Signed)
Spoke with patient, advised as seen below per Dr. Quincy Simmonds. Rx for Aygestin to verified pharmacy on file, scheduled for OV for recheck on 8/30 at 3:30pm. Patient aware to start Aygestin today, will not start Micronor. Patient states she has been experiencing leg tingles as she was before when her hemoglobin was low, is concerned since she will be going out of town on Friday. Advised patient will update Dr. Quincy Simmonds and return call with recommendations. Patient verbalizes understanding and is agreeable.    Reviewed with Dr. Quincy Simmonds, recommended OV tomorrow at 8:30 am for evaluation.    Call returned to patient, scheduled for OV on 7/26 at 8:30am with Dr. Quincy Simmonds. Patient agreeable to date and time.    Routing to provider for final review. Patient is agreeable to disposition. Will close encounter.

## 2016-10-11 NOTE — Telephone Encounter (Signed)
Patient wants to speak with the nurse. She states she just had a blood transfusion. She is starting to have the same symptoms as before.

## 2016-10-11 NOTE — Telephone Encounter (Signed)
Please use Aygestin 5 mg po bid instead of Micronor.  Dispense:  60, RF one.  Please come in for follow up appointment in about 4 - 6 week, sooner if bleeding does not improve.

## 2016-10-12 ENCOUNTER — Ambulatory Visit (INDEPENDENT_AMBULATORY_CARE_PROVIDER_SITE_OTHER): Payer: BLUE CROSS/BLUE SHIELD | Admitting: Obstetrics and Gynecology

## 2016-10-12 ENCOUNTER — Encounter: Payer: Self-pay | Admitting: Obstetrics and Gynecology

## 2016-10-12 VITALS — BP 116/72 | HR 72 | Resp 16 | Wt 254.0 lb

## 2016-10-12 DIAGNOSIS — N921 Excessive and frequent menstruation with irregular cycle: Secondary | ICD-10-CM

## 2016-10-12 LAB — CBC
Hematocrit: 32.2 % — ABNORMAL LOW (ref 34.0–46.6)
Hemoglobin: 9.8 g/dL — ABNORMAL LOW (ref 11.1–15.9)
MCH: 19.9 pg — AB (ref 26.6–33.0)
MCHC: 30.4 g/dL — AB (ref 31.5–35.7)
MCV: 65 fL — ABNORMAL LOW (ref 79–97)
PLATELETS: 350 10*3/uL (ref 150–379)
RBC: 4.92 x10E6/uL (ref 3.77–5.28)
RDW: 27.6 % — AB (ref 12.3–15.4)
WBC: 5.7 10*3/uL (ref 3.4–10.8)

## 2016-10-12 LAB — HEMOGLOBIN: Hemoglobin: 9.9

## 2016-10-12 NOTE — Progress Notes (Signed)
GYNECOLOGY  VISIT   HPI: 46 y.o.   Married  Caucasian  female   G2P0002 with Patient's last menstrual period was 10/07/2016.   here for  Follow-up for bleeding.  Menses started.  Pad change every 2 hours.  Some lightheadedness and feeling some tingling in her legs just like when she was anemic.   She has had a transfusion of 2 units of packed red blood cells.   EMB showed proliferative effect.   Started Aygestin 5 mg po bid.  Took dose last hs and then this am.  Much less bleeding now.   GYNECOLOGIC HISTORY: Patient's last menstrual period was 10/07/2016. Contraception:  Vasectomy Menopausal hormone therapy:  N/A Last mammogram:   03/24/16 - BIRADS 1 neg/density B Last pap smear:   09/27/16 - normal, HR HPV negative        OB History    Gravida Para Term Preterm AB Living   2 2 0 0 0 2   SAB TAB Ectopic Multiple Live Births   0 0 0 0 0         Patient Active Problem List   Diagnosis Date Noted  . Iron deficiency anemia 09/30/2016  . Insomnia 07/22/2016  . Auditory processing disorder 04/04/2016  . Encounter for routine gynecological examination 11/27/2014  . Morbid obesity ([place]) 12/13/2012  . Hyperglycemia 12/13/2012  . Other screening mammogram 12/12/2011  . Routine general medical examination at a health care facility 12/04/2011  . HYPERTRIGLYCERIDEMIA 04/07/2008  . PROBLEMS WITH HEARING 04/07/2008  . MAMMOGRAM, ABNORMAL 03/18/2008  . Anxiety disorder 02/07/2008  . ALLERGIC RHINITIS 02/07/2008  . HYPOGLYCEMIA, REACTIVE 11/15/2006    Past Medical History:  Diagnosis Date  . Anxiety   . History of allergic rhinitis   . History of anxiety   . History of depression   . History of hyperlipidemia   . History of syncope     Past Surgical History:  Procedure Laterality Date  . CESAREAN SECTION      Current Outpatient Prescriptions  Medication Sig Dispense Refill  . atorvastatin (LIPITOR) 20 MG tablet Take 1 tablet (20 mg total) by mouth daily. 90 tablet 3   . Choline Fenofibrate (FENOFIBRIC ACID) 135 MG CPDR Take 125 mg by mouth daily. 90 capsule 3  . escitalopram (LEXAPRO) 20 MG tablet Take 1 tablet (20 mg total) by mouth daily. 90 tablet 3  . miglitol (GLYSET) 25 MG tablet TAKE 1 TABLET (25 MG TOTAL) BY MOUTH 2 (TWO) TIMES DAILY. (Patient taking differently: TAKE 1 TABLET (25 MG TOTAL) BY MOUTHDAILY.) 60 tablet 0  . norethindrone (AYGESTIN) 5 MG tablet Take 1 tablet (5 mg total) by mouth 2 (two) times daily. 60 tablet 1   No current facility-administered medications for this visit.      ALLERGIES: Penicillins  Family History  Problem Relation Age of Onset  . Diabetes Father   . Diabetes Paternal Grandfather   . Prostate cancer Paternal Grandfather   . Diabetes Paternal Grandmother     Social History   Social History  . Marital status: Married    Spouse name: N/A  . Number of children: N/A  . Years of education: N/A   Occupational History  . Not on file.   Social History Main Topics  . Smoking status: Never Smoker  . Smokeless tobacco: Never Used  . Alcohol use 0.0 oz/week     Comment: occasional  . Drug use: No  . Sexual activity: Yes    Birth control/ protection: Surgical  Comment: Vasectomy   Other Topics Concern  . Not on file   Social History Narrative  . No narrative on file    ROS:  Pertinent items are noted in HPI.  PHYSICAL EXAMINATION:    BP 116/72 (BP Location: Right Arm, Patient Position: Sitting, Cuff Size: Large)   Pulse 72   Resp 16   Wt 254 lb (115.2 kg)   LMP 10/07/2016   BMI 42.27 kg/m     General appearance: alert, cooperative and appears stated age    Pelvic: External genitalia:  no lesions              Urethra:  normal appearing urethra with no masses, tenderness or lesions              Bartholins and Skenes: normal                 Vagina: normal appearing vagina with normal color and discharge, no lesions              Cervix: no lesions.  Small clot at os.                  Bimanual Exam:  Uterus:  normal size, contour, position, consistency, mobility, non-tender              Adnexa: no mass, fullness, tenderness                Chaperone was present for exam.  ASSESSMENT  Menorrhagia with irregular menses.  Anovulatory bleeding.   PLAN  Check hgb and formal CBC.  Continue Aygestin 5 gm po bid.  Keep a bleeding calendar. I think it is ok to travel.  Follow up in 4 week and prn.    An After Visit Summary was printed and given to the patient.  _15_____ minutes face to face time of which over 50% was spent in counseling.

## 2016-10-27 DIAGNOSIS — H2513 Age-related nuclear cataract, bilateral: Secondary | ICD-10-CM | POA: Diagnosis not present

## 2016-10-27 DIAGNOSIS — H02401 Unspecified ptosis of right eyelid: Secondary | ICD-10-CM | POA: Diagnosis not present

## 2016-10-27 DIAGNOSIS — H04123 Dry eye syndrome of bilateral lacrimal glands: Secondary | ICD-10-CM | POA: Diagnosis not present

## 2016-10-31 ENCOUNTER — Encounter: Payer: Self-pay | Admitting: Obstetrics and Gynecology

## 2016-11-16 ENCOUNTER — Ambulatory Visit (INDEPENDENT_AMBULATORY_CARE_PROVIDER_SITE_OTHER): Payer: BLUE CROSS/BLUE SHIELD | Admitting: Obstetrics and Gynecology

## 2016-11-16 ENCOUNTER — Encounter: Payer: Self-pay | Admitting: Obstetrics and Gynecology

## 2016-11-16 VITALS — BP 116/70 | HR 64 | Resp 16 | Wt 255.0 lb

## 2016-11-16 DIAGNOSIS — N939 Abnormal uterine and vaginal bleeding, unspecified: Secondary | ICD-10-CM

## 2016-11-16 LAB — CBC
HEMATOCRIT: 36.1 % (ref 34.0–46.6)
HEMOGLOBIN: 11.2 g/dL (ref 11.1–15.9)
MCH: 23.9 pg — AB (ref 26.6–33.0)
MCHC: 31 g/dL — AB (ref 31.5–35.7)
MCV: 77 fL — AB (ref 79–97)
Platelets: 299 10*3/uL (ref 150–379)
RBC: 4.69 x10E6/uL (ref 3.77–5.28)
RDW: 28.4 % — AB (ref 12.3–15.4)
WBC: 6.7 10*3/uL (ref 3.4–10.8)

## 2016-11-16 MED ORDER — NORETHINDRONE ACETATE 5 MG PO TABS: ORAL_TABLET | ORAL | 2 refills | Status: DC

## 2016-11-16 NOTE — Progress Notes (Signed)
GYNECOLOGY  VISIT   HPI: 46 y.o.   Married  Caucasian  female   G2P0002 with No LMP recorded. Patient is not currently having periods (Reason: Irregular Periods).   here for follow up.   On Aygestin 5 mg po bid.  Still bleeding every day.  Pad change three times per day.  Last Hgb 9.8.   Feels tired but not as bad as previously, prior to her transfusion of 2 units pRBCs.  EMB 10/05/16 showed disordered proliferative endometrium with no hyperplasia or malignancy.  Considering hysterectomy for the end of Nov. or beginning of Dec.   GYNECOLOGIC HISTORY: No LMP recorded. Patient is not currently having periods (Reason: Irregular Periods). Contraception: Vasectomy Menopausal hormone therapy:  n/a Last mammogram: 03/24/16 - BIRADS 1 neg/density B Last pap smear:  09/27/16 - normal, HR HPV negative        OB History    Gravida Para Term Preterm AB Living   2 2 0 0 0 2   SAB TAB Ectopic Multiple Live Births   0 0 0 0 0         Patient Active Problem List   Diagnosis Date Noted  . Iron deficiency anemia 09/30/2016  . Insomnia 07/22/2016  . Auditory processing disorder 04/04/2016  . Encounter for routine gynecological examination 11/27/2014  . Morbid obesity ([place]) 12/13/2012  . Hyperglycemia 12/13/2012  . Other screening mammogram 12/12/2011  . Routine general medical examination at a health care facility 12/04/2011  . HYPERTRIGLYCERIDEMIA 04/07/2008  . PROBLEMS WITH HEARING 04/07/2008  . MAMMOGRAM, ABNORMAL 03/18/2008  . Anxiety disorder 02/07/2008  . ALLERGIC RHINITIS 02/07/2008  . HYPOGLYCEMIA, REACTIVE 11/15/2006    Past Medical History:  Diagnosis Date  . Anxiety   . History of allergic rhinitis   . History of anxiety   . History of depression   . History of hyperlipidemia   . History of syncope     Past Surgical History:  Procedure Laterality Date  . CESAREAN SECTION      Current Outpatient Prescriptions  Medication Sig Dispense Refill  . atorvastatin  (LIPITOR) 20 MG tablet Take 1 tablet (20 mg total) by mouth daily. 90 tablet 3  . Choline Fenofibrate (FENOFIBRIC ACID) 135 MG CPDR Take 125 mg by mouth daily. 90 capsule 3  . escitalopram (LEXAPRO) 20 MG tablet Take 1 tablet (20 mg total) by mouth daily. 90 tablet 3  . miglitol (GLYSET) 25 MG tablet TAKE 1 TABLET (25 MG TOTAL) BY MOUTH 2 (TWO) TIMES DAILY. (Patient taking differently: TAKE 1 TABLET (25 MG TOTAL) BY MOUTHDAILY.) 60 tablet 0  . norethindrone (AYGESTIN) 5 MG tablet Take 1 tablet (5 mg total) by mouth 2 (two) times daily. 60 tablet 1   No current facility-administered medications for this visit.      ALLERGIES: Penicillins  Family History  Problem Relation Age of Onset  . Diabetes Father   . Diabetes Paternal Grandfather   . Prostate cancer Paternal Grandfather   . Diabetes Paternal Grandmother     Social History   Social History  . Marital status: Married    Spouse name: N/A  . Number of children: N/A  . Years of education: N/A   Occupational History  . Not on file.   Social History Main Topics  . Smoking status: Never Smoker  . Smokeless tobacco: Never Used  . Alcohol use 0.0 oz/week     Comment: occasional  . Drug use: No  . Sexual activity: Yes  Birth control/ protection: Surgical     Comment: Vasectomy   Other Topics Concern  . Not on file   Social History Narrative  . No narrative on file    ROS:  Pertinent items are noted in HPI.  PHYSICAL EXAMINATION:    BP 116/70 (BP Location: Right Arm, Patient Position: Sitting, Cuff Size: Large)   Pulse 64   Resp 16   Wt 255 lb (115.7 kg)   BMI 42.43 kg/m     General appearance: alert, cooperative and appears stated age  Pelvic: External genitalia:  no lesions              Urethra:  normal appearing urethra with no masses, tenderness or lesions              Bartholins and Skenes: normal                 Vagina: normal appearing vagina with normal color and discharge, no lesions               Cervix: no lesions.  Vaginal bleeding noted.                 Bimanual Exam:  Uterus:  normal size, contour, position, consistency, mobility, non-tender              Adnexa: no mass, fullness, tenderness           Chaperone was present for exam.  ASSESSMENT  Menorrhagia with irregular cycles.  Anovulatory bleeding.  Status post transfusion of 2 units of pRBCs.  On Aygestin.  Hx Cesarean Section.  Elevated hemoglobin A1C.  Due to see Dr. Forde Dandy next week.   PLAN  We discussed Mirena IUD versus laparoscopic hysterectomy with bilateral salpingectomy - risks and benefits of each.   Risks include but are not limited to bleeding, infection, damage to surrounding organs, pneumonia, reaction to anesthesia, DVT, PE, death, need for reoperation, and hernia formation.  Will start to precert this.  Increase to Aygestin 15 mg daily.  Check CBC.  Will get clearance from Dr. Forde Dandy for her hysterectomy.  We did talk about potential increased risk of complications of surgery for patients with uncontrolled diabetes.   An After Visit Summary was printed and given to the patient.  __15____ minutes face to face time of which over 50% was spent in counseling.

## 2016-11-28 ENCOUNTER — Telehealth: Payer: Self-pay | Admitting: Obstetrics and Gynecology

## 2016-11-28 NOTE — Telephone Encounter (Signed)
Return call to patient. Date options discussed for 02-12-17. Patient will call back after appointment with Dr Forde Dandy on 12-04-16.

## 2016-11-28 NOTE — Telephone Encounter (Signed)
Patient returned call. Reviewed benefit for surgical procedure. Patient understood and agreeable. Patient confirmed and is ready to proceed with scheduling. Patient aware this is professional benefit only. Patient aware will be contacted by hospital for separate benefits. Forwarding chart to Nurse Supervisor for scheduling.  Routing to Lamont Snowball, RN

## 2016-11-28 NOTE — Telephone Encounter (Signed)
Call to patient to discuss surgery date options. Left message to call back.

## 2016-11-28 NOTE — Telephone Encounter (Signed)
Call placed to patient to review benefits for a recommended surgical procedure. Left voicemail message requesting a return call.    cc: Lamont Snowball, RN

## 2016-11-28 NOTE — Telephone Encounter (Signed)
Patient returning your call.

## 2016-12-04 DIAGNOSIS — D649 Anemia, unspecified: Secondary | ICD-10-CM | POA: Diagnosis not present

## 2016-12-04 DIAGNOSIS — E784 Other hyperlipidemia: Secondary | ICD-10-CM | POA: Diagnosis not present

## 2016-12-04 DIAGNOSIS — N921 Excessive and frequent menstruation with irregular cycle: Secondary | ICD-10-CM | POA: Diagnosis not present

## 2016-12-04 DIAGNOSIS — Z1389 Encounter for screening for other disorder: Secondary | ICD-10-CM | POA: Diagnosis not present

## 2016-12-04 DIAGNOSIS — R7302 Impaired glucose tolerance (oral): Secondary | ICD-10-CM | POA: Diagnosis not present

## 2016-12-04 NOTE — Telephone Encounter (Signed)
Patient called requesting to speak with Sally. °

## 2016-12-04 NOTE — Telephone Encounter (Signed)
Return call to patient. States Dr Forde Dandy has cleared her for surgery and will send note to office.  Patient desires to proceed with surgery on 02-12-17. Surgery instructions reviewed and printed copy will be mailed to patient. Questions answered and patient to call back as needed.  Routing to provider for final review. Patient agreeable to disposition. Will close encounter.

## 2016-12-05 ENCOUNTER — Telehealth: Payer: Self-pay | Admitting: Obstetrics & Gynecology

## 2016-12-05 NOTE — Telephone Encounter (Signed)
46 yo with hx of menorrhagia planning on hysterectomy in November called tonight with increased bleeding.  Has passed some clots today.  Hb 11/16/16 was 11.2.  Did have blood work yesterday with Dr. Forde Dandy.  Does not know results but was not called with any abnormal results today.  Hb earlier this year was 6.5 and she did receive transfusions.  Reports she does not feel anything like she did then.  No SOB, no lightheadedness, no dizziness, no finger tingling.  Just a little anxious about the bleeding given she is taking aygestin 5mg  TID.  D/w pt she could increase the aygestin or change to megace.  She has the aygestin now and would not have to go out for new rx so would prefer this.  She's taken 5mg  this morning and early afternoon.  She is advised to take 15mg  tonight and then 10mg  in AM and call to give update.  Advised I will send this to Dr. Quincy Simmonds and also to triage nurse to call and get update from pt in am as well.  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  Also gave pt my pager number if she worsens tonight or feels she needs to be seen in [facility].                                                                                                                                                                                                                                                                                                                                                                                                                                                  ...............................................................................................Marland Kitchen

## 2016-12-06 ENCOUNTER — Telehealth: Payer: Self-pay | Admitting: *Deleted

## 2016-12-06 ENCOUNTER — Encounter: Payer: Self-pay | Admitting: Obstetrics and Gynecology

## 2016-12-06 ENCOUNTER — Ambulatory Visit (INDEPENDENT_AMBULATORY_CARE_PROVIDER_SITE_OTHER): Payer: BLUE CROSS/BLUE SHIELD | Admitting: Obstetrics and Gynecology

## 2016-12-06 VITALS — BP 122/82 | HR 90 | Resp 16 | Wt 253.4 lb

## 2016-12-06 DIAGNOSIS — N921 Excessive and frequent menstruation with irregular cycle: Secondary | ICD-10-CM

## 2016-12-06 NOTE — Telephone Encounter (Signed)
Spoke with patient. Scheduled for OV today at 12pm with Dr. Quincy Simmonds. Reports taking aygestin 10mg  last night and again this morning. Patient states bled through night and "nothing" this morning. Patient agreeable to date and time.  Routing to provider for final review. Patient is agreeable to disposition. Will close encounter.  Cc: Dr. Sabra Heck

## 2016-12-06 NOTE — Telephone Encounter (Signed)
Patient returning your call.

## 2016-12-06 NOTE — Telephone Encounter (Addendum)
Call to Dr Baldwin Crown office requesting office notes and labs from appointment on Monday 12-04-16.  Phone report received from Dr Sabra Heck.   See phone encounter from 12-05-16 from Dr Sabra Heck and triage phone encounter from today placed by Glorianne Manchester  RN.  Will close this encounter.

## 2016-12-06 NOTE — Progress Notes (Signed)
GYNECOLOGY  VISIT   HPI: 46 y.o.   Married  Caucasian  female   G2P0002 with No LMP recorded. Patient is not currently having periods (Reason: Irregular Periods).   here for f/u heavy bleeding; per patient filled up pads about every hour yesterday. Patient states that bleeding has slacked up today. Small amount of cramping.  Presented for her annual exam September 27, 2016 with bleeding since May 2018.  She bled from 08/17/16 - 09/17/16. Was noted to be severely anemic and was admitted for transfusion of packed red blood cells. She was not bleeding at the time of her annual examination.   She subsequently had a pelvic US, sonohysterogram, and EMB.  Her EMS was 10 mm and she had no filling defects.  The EMB showed disordered proliferative endometrium and focal glandular crowding.   She started Aygestin 5 mg po bid and continued to have bleeding, so the dosage was increased to Aygestin 5 mg po tid starting on 11/16/16.    Changed her pad every hour yesterday.  Called in to on call provider last hs.  She took two Aygestin last night.  Took only one today.  No lightheadedness or weakness.    Saw Dr. Forde Dandy 2 days ago in preparation for upcoming hysterectomy in November.  HgbA1C was 5.4. Hgb 12.0 and iron saturation 9%. Dr. Forde Dandy is now prescribing iron supplementation.  GYNECOLOGIC HISTORY: No LMP recorded. Patient is not currently having periods (Reason: Irregular Periods). Contraception:  Vasectomy Menopausal hormone therapy:  n/a Last mammogram: 03/24/16 - BIRADS 1 neg/density B Last pap smear: 09/27/16 - normal, HR HPV negative                             11-27-14 Neg:Neg HR HPV OB History    Gravida Para Term Preterm AB Living   2 2 0 0 0 2   SAB TAB Ectopic Multiple Live Births   0 0 0 0 0         Patient Active Problem List   Diagnosis Date Noted  . Iron deficiency anemia 09/30/2016  . Insomnia 07/22/2016  . Auditory processing disorder 04/04/2016  . Encounter for routine  gynecological examination 11/27/2014  . Morbid obesity ([place]) 12/13/2012  . Hyperglycemia 12/13/2012  . Other screening mammogram 12/12/2011  . Routine general medical examination at a health care facility 12/04/2011  . HYPERTRIGLYCERIDEMIA 04/07/2008  . PROBLEMS WITH HEARING 04/07/2008  . MAMMOGRAM, ABNORMAL 03/18/2008  . Anxiety disorder 02/07/2008  . ALLERGIC RHINITIS 02/07/2008  . HYPOGLYCEMIA, REACTIVE 11/15/2006    Past Medical History:  Diagnosis Date  . Anxiety   . History of allergic rhinitis   . History of anxiety   . History of depression   . History of hyperlipidemia   . History of syncope     Past Surgical History:  Procedure Laterality Date  . CESAREAN SECTION      Current Outpatient Prescriptions  Medication Sig Dispense Refill  . atorvastatin (LIPITOR) 20 MG tablet Take 1 tablet (20 mg total) by mouth daily. 90 tablet 3  . Choline Fenofibrate (FENOFIBRIC ACID) 135 MG CPDR Take 125 mg by mouth daily. 90 capsule 3  . escitalopram (LEXAPRO) 20 MG tablet Take 1 tablet (20 mg total) by mouth daily. 90 tablet 3  . miglitol (GLYSET) 25 MG tablet TAKE 1 TABLET (25 MG TOTAL) BY MOUTH 2 (TWO) TIMES DAILY. (Patient taking differently: TAKE 1 TABLET (25 MG TOTAL) BY MOUTHDAILY.)  60 tablet 0  . norethindrone (AYGESTIN) 5 MG tablet Take one tablet (15 mg) daily. 30 tablet 2   No current facility-administered medications for this visit.      ALLERGIES: Penicillins  Family History  Problem Relation Age of Onset  . Diabetes Father   . Diabetes Paternal Grandfather   . Prostate cancer Paternal Grandfather   . Diabetes Paternal Grandmother     Social History   Social History  . Marital status: Married    Spouse name: N/A  . Number of children: N/A  . Years of education: N/A   Occupational History  . Not on file.   Social History Main Topics  . Smoking status: Never Smoker  . Smokeless tobacco: Never Used  . Alcohol use 0.0 oz/week     Comment: occasional   . Drug use: No  . Sexual activity: Yes    Birth control/ protection: Surgical     Comment: Vasectomy   Other Topics Concern  . Not on file   Social History Narrative  . No narrative on file    ROS:  Pertinent items are noted in HPI.  PHYSICAL EXAMINATION:    BP 122/82 (BP Location: Right Arm, Patient Position: Sitting, Cuff Size: Large)   Pulse 90   Resp 16   Wt 253 lb 6.4 oz (114.9 kg)   BMI 42.17 kg/m     General appearance: alert, cooperative and appears stated age  Abdomen:  Obese. Pelvic: External genitalia:  no lesions              Urethra:  normal appearing urethra with no masses, tenderness or lesions              Bartholins and Skenes: normal                 Vagina: normal appearing vagina with normal color and discharge, no lesions              Cervix: no lesions.  Small amount of clot in the vagina.                 Bimanual Exam:  Uterus:  normal size, contour, position, consistency, mobility, non-tender              Adnexa: no mass, fullness, tenderness               Chaperone was present for exam.  ASSESSMENT  Menorrhagia with irregular menses.  Hx transfusion.  Low Fe saturation.  DM.   PLAN  I discussed options for care:  Aygestin 5 mg, take 3 tablets every morning, Megace bid, Mirena IUD, hysteroscopy/dilation and curettage +/- Mirena IUD, move up the hysterectomy date.  Patient opts to take Aygestin 15 mg q am and move up surgery.     An After Visit Summary was printed and given to the patient.  _15_____ minutes face to face time of which over 50% was spent in counseling.

## 2016-12-06 NOTE — Patient Instructions (Signed)
We will call to move up your surgery date!

## 2016-12-06 NOTE — Telephone Encounter (Signed)
Return call to patient. Discussed options for earlier surgery date. Advised checking on hospital availability and will call her back tomorrow.

## 2016-12-06 NOTE — Telephone Encounter (Signed)
Called patient to f/u per Dr. Sanjuan Dame request, see previous telephone encounter dated 12/05/16.   Spoke with patient, unable to talk will return call in an hour.

## 2016-12-06 NOTE — Telephone Encounter (Signed)
See telephone encounter dated 12/06/16 for f/u.

## 2016-12-06 NOTE — Telephone Encounter (Signed)
Patient returning call to Gail [name] to discuss moving up surgery date. Advised patient Gail [name] is unavaialble, will have her return call. Patient is agreeable.

## 2016-12-07 NOTE — Telephone Encounter (Signed)
Call to patient. Advised surgery is rescheduled ( moved up ) for Tuesday 12-19-16 at 0730 at [facility]. Surgery instructions have previously been reviewed and copy has been mailed. Surgery related appointments adjusted for new date. FMLA forms discussed and fax number provided.  Patient to call back if any additional questions.  Routing to provider for final review. Patient agreeable to disposition. Will close encounter.

## 2016-12-11 ENCOUNTER — Ambulatory Visit (INDEPENDENT_AMBULATORY_CARE_PROVIDER_SITE_OTHER): Payer: BLUE CROSS/BLUE SHIELD | Admitting: Obstetrics and Gynecology

## 2016-12-11 ENCOUNTER — Other Ambulatory Visit: Payer: Self-pay | Admitting: Obstetrics and Gynecology

## 2016-12-11 ENCOUNTER — Encounter: Payer: Self-pay | Admitting: Obstetrics and Gynecology

## 2016-12-11 ENCOUNTER — Telehealth: Payer: Self-pay | Admitting: Obstetrics and Gynecology

## 2016-12-11 VITALS — BP 118/78 | HR 88 | Resp 16 | Wt 253.0 lb

## 2016-12-11 DIAGNOSIS — Z0289 Encounter for other administrative examinations: Secondary | ICD-10-CM

## 2016-12-11 DIAGNOSIS — N921 Excessive and frequent menstruation with irregular cycle: Secondary | ICD-10-CM

## 2016-12-11 NOTE — Patient Instructions (Addendum)
Your procedure is scheduled on:  Tuesday, Oct 2nd  Enter through the Main Entrance of [facility] at:  6 am  Pick up the phone at the desk and dial [phone].  Call this number if you have problems the morning of surgery: [phone].  Remember: Do NOT eat food after midnight Monday  Do NOT drink clear liquids (including water) after midnight Monday  Take these medicines the morning of surgery with a SIP OF WATER:  lexapro.  Do not use any diabetes medication (glyset) on day of surgery.  We will check your blood sugar upon arrival and treat if needed,  Do NOT wear jewelry (body piercing), metal hair clips/bobby pins, make-up, or nail polish. Do NOT wear lotions, powders, or perfumes.  You may wear deoderant. Do NOT shave for 48 hours prior to surgery. Do NOT bring valuables to the hospital. .  Leave suitcase in car.  After surgery it may be brought to your room.  For patients admitted to the hospital, checkout time is 11:00 AM the day of discharge. Have a responsible adult drive you home and stay with you for 24 hours after your procedure.  Home with husband Gail [name] cell [phone]

## 2016-12-11 NOTE — Telephone Encounter (Signed)
Medication refill request: Aygestin  Last AEX:  09-27-16  Next AEX: not yet scheduled  Last MMG (if hormonal medication request): 04-03-16 WNL  Refill authorized: please advise

## 2016-12-11 NOTE — Telephone Encounter (Signed)
Gail [name] with [facility] needs clarification on the prescription Aygestin

## 2016-12-11 NOTE — Telephone Encounter (Signed)
Spoke with pharmacist Tye Maryland at [facility]. Advised rx is to be filled for Aygestin 5 mg take 3 tablets po daily (15 mg total) #42  0RF. Tye Maryland is agreeable and will fill rx at this time.  Routing to provider for final review. Patient agreeable to disposition. Will close encounter.

## 2016-12-11 NOTE — Progress Notes (Signed)
GYNECOLOGY  VISIT   HPI: 46 y.o.   Married  Caucasian  female   G2P0002 with No LMP recorded. Patient is not currently having periods (Reason: Irregular Periods).   here for surgical consult.     Patient desires definitive treatment for her vaginal bleeding and declines future childbearing.   Presented for her annual exam September 27, 2016 with bleeding since May 2018.  She bled from 08/17/16 - 09/17/16. Was noted to be severely anemic and was admitted for transfusion of packed red blood cells. She was not bleeding at the time of her annual examination.   She subsequently had a pelvic US, sonohysterogram, and EMB.  Her EMS was 10 mm and she had no filling defects.  The EMB showed disordered proliferative endometrium and focal glandular crowding.   She started Aygestin 5 mg po bid and continued to have bleeding, so the dosage was increased to Aygestin 5 mg po tid starting on 11/16/16.  Now on Aygestin 15 mg daily.  Bleeding profile is better per patient. Decreased energy but feeling overall ok.   She has not started her iron supplement from Dr. Forde Dandy who has cleared her for hysterectomy.   GYNECOLOGIC HISTORY: No LMP recorded. Patient is not currently having periods (Reason: Irregular Periods). Contraception:  Vasectomy Menopausal hormone therapy: she is on Aygestin 15mg  q am Last mammogram:  03/24/16 - BIRADS 1 neg/density B Last pap smear: 09/27/16 - normal, HR HPV negative                             11-27-14 Neg:Neg HR HPV        OB History    Gravida Para Term Preterm AB Living   2 2 0 0 0 2   SAB TAB Ectopic Multiple Live Births   0 0 0 0 0         Patient Active Problem List   Diagnosis Date Noted  . Iron deficiency anemia 09/30/2016  . Insomnia 07/22/2016  . Auditory processing disorder 04/04/2016  . Encounter for routine gynecological examination 11/27/2014  . Morbid obesity ([place]) 12/13/2012  . Hyperglycemia 12/13/2012  . Other screening mammogram 12/12/2011  . Routine  general medical examination at a health care facility 12/04/2011  . HYPERTRIGLYCERIDEMIA 04/07/2008  . PROBLEMS WITH HEARING 04/07/2008  . MAMMOGRAM, ABNORMAL 03/18/2008  . Anxiety disorder 02/07/2008  . ALLERGIC RHINITIS 02/07/2008  . HYPOGLYCEMIA, REACTIVE 11/15/2006    Past Medical History:  Diagnosis Date  . Anxiety   . History of allergic rhinitis   . History of anxiety   . History of depression   . History of hyperlipidemia   . History of syncope     Past Surgical History:  Procedure Laterality Date  . CESAREAN SECTION      Current Outpatient Prescriptions  Medication Sig Dispense Refill  . atorvastatin (LIPITOR) 20 MG tablet Take 1 tablet (20 mg total) by mouth daily. 90 tablet 3  . Choline Fenofibrate (FENOFIBRIC ACID) 135 MG CPDR Take 125 mg by mouth daily. 90 capsule 3  . escitalopram (LEXAPRO) 20 MG tablet Take 1 tablet (20 mg total) by mouth daily. 90 tablet 3  . FERREX 150 150 MG capsule Take by mouth daily.  6  . miglitol (GLYSET) 25 MG tablet TAKE 1 TABLET (25 MG TOTAL) BY MOUTH 2 (TWO) TIMES DAILY. (Patient taking differently: TAKE 1 TABLET (25 MG TOTAL) BY MOUTHDAILY.) 60 tablet 0  . norethindrone (AYGESTIN) 5  MG tablet Take 3 tablets (15 mg) by mouth daily. 42 tablet 0   No current facility-administered medications for this visit.      ALLERGIES: Penicillins  Family History  Problem Relation Age of Onset  . Diabetes Father   . Diabetes Paternal Grandfather   . Prostate cancer Paternal Grandfather   . Diabetes Paternal Grandmother     Social History   Social History  . Marital status: Married    Spouse name: N/A  . Number of children: N/A  . Years of education: N/A   Occupational History  . Not on file.   Social History Main Topics  . Smoking status: Never Smoker  . Smokeless tobacco: Never Used  . Alcohol use 0.0 oz/week     Comment: occasional  . Drug use: No  . Sexual activity: Yes    Birth control/ protection: Surgical     Comment:  Vasectomy   Other Topics Concern  . Not on file   Social History Narrative  . No narrative on file    ROS:  Pertinent items are noted in HPI.  PHYSICAL EXAMINATION:    BP 118/78 (BP Location: Right Arm, Patient Position: Sitting, Cuff Size: Large)   Pulse 88   Resp 16   Wt 253 lb (114.8 kg)   BMI 42.10 kg/m     General appearance: alert, cooperative and appears stated age Head: Normocephalic, without obvious abnormality, atraumatic Neck: no adenopathy, supple, symmetrical, trachea midline and thyroid normal to inspection and palpation Lungs: clear to auscultation bilaterally Heart: regular rate and rhythm Abdomen: obese, soft, non-tender, no masses,  no organomegaly Extremities: extremities normal, atraumatic, no cyanosis or edema Skin: Skin color, texture, turgor normal. No rashes or lesions Lymph nodes: Cervical, supraclavicular, and axillary nodes normal. No abnormal inguinal nodes palpated Neurologic: Grossly normal  Pelvic: External genitalia:  no lesions              Urethra:  normal appearing urethra with no masses, tenderness or lesions              Bartholins and Skenes: normal                 Vagina: normal appearing vagina with normal color and discharge, no lesions              Cervix: no lesions.  Moderate menstrual flow.                 Bimanual Exam:  Uterus:  normal size, contour, position, consistency, mobility, non-tender              Adnexa: no mass, fullness, tenderness                Chaperone was present for exam.  ASSESSMENT  Menorrhagia with irregular menses. Thin walled right ovarian cyst by [place] on 10/05/16. Status post transfusion of 2 units paced red blood cells.  Hx Cesarean Section.  AODM.    PLAN  Plan for total laparoscopic hysterectomy with bilateral salpingectomy, possible bilateral oophorectomy if ovaries are abnormal, cystoscopy.   I discussed possible risk of laparotomy.  We reviewed again her history of Cesarean Section and  possible increased risk of bladder injury.  Risks, benefits, and alternatives to hysterectomy discussed with the patient who wishes to proceed.  Surgical expectations, hospital stay, and recovery discussed.  She will be out of work for 6 weeks.  Continue Aygestin 15 mg daily.  She will do a Mg Citrate bowel prep pre-op.  Plan for Lovenox.   An After Visit Summary was printed and given to the patient.

## 2016-12-11 NOTE — Patient Instructions (Signed)
When you go to the pharmacy, please pick up the following: Magnesium citrate - 1 bottle.  Miralax.  Colace stool softeners.  Pads.

## 2016-12-13 ENCOUNTER — Ambulatory Visit: Payer: Self-pay | Admitting: Obstetrics and Gynecology

## 2016-12-15 ENCOUNTER — Other Ambulatory Visit: Payer: Self-pay

## 2016-12-15 ENCOUNTER — Encounter (HOSPITAL_COMMUNITY): Payer: Self-pay

## 2016-12-15 ENCOUNTER — Encounter (HOSPITAL_COMMUNITY)
Admission: RE | Admit: 2016-12-15 | Discharge: 2016-12-15 | Disposition: A | Discharge status: Home / Self Care | Payer: BLUE CROSS/BLUE SHIELD | Source: Ambulatory Visit | Attending: Obstetrics and Gynecology | Admitting: Obstetrics and Gynecology

## 2016-12-15 DIAGNOSIS — Z01818 Encounter for other preprocedural examination: Secondary | ICD-10-CM | POA: Diagnosis not present

## 2016-12-15 HISTORY — DX: Unspecified convulsions: R56.9

## 2016-12-15 HISTORY — DX: Type 2 diabetes mellitus without complications: E11.9

## 2016-12-15 HISTORY — DX: Depression, unspecified: F32.A

## 2016-12-15 HISTORY — DX: Personal history of other medical treatment: Z92.89

## 2016-12-15 HISTORY — DX: Unspecified hearing loss, unspecified ear: H91.90

## 2016-12-15 HISTORY — DX: Major depressive disorder, single episode, unspecified: F32.9

## 2016-12-15 HISTORY — DX: Sleep apnea, unspecified: G47.30

## 2016-12-15 HISTORY — DX: Dyspnea, unspecified: R06.00

## 2016-12-15 HISTORY — DX: Anemia, unspecified: D64.9

## 2016-12-15 LAB — BASIC METABOLIC PANEL
Anion gap: 9 (ref 5–15)
BUN: 11 mg/dL (ref 6–20)
CHLORIDE: 104 mmol/L (ref 101–111)
CO2: 23 mmol/L (ref 22–32)
Calcium: 8.8 mg/dL — ABNORMAL LOW (ref 8.9–10.3)
Creatinine, Ser: 0.77 mg/dL (ref 0.44–1.00)
GFR calc Af Amer: >60 mL/min (ref >60)
GFR calc non Af Amer: >60 mL/min (ref >60)
Glucose, Bld: 181 mg/dL — ABNORMAL HIGH (ref 65–99)
POTASSIUM: 4.4 mmol/L (ref 3.5–5.1)
SODIUM: 136 mmol/L (ref 135–145)

## 2016-12-15 LAB — CBC
HEMATOCRIT: 32.7 % — AB (ref 36.0–46.0)
HEMOGLOBIN: 10.5 g/dL — AB (ref 12.0–15.0)
MCH: 26.5 pg (ref 26.0–34.0)
MCHC: 32.1 g/dL (ref 30.0–36.0)
MCV: 82.6 fL (ref 78.0–100.0)
PLATELETS: 329 10*3/uL (ref 150–400)
RBC: 3.96 MIL/uL (ref 3.87–5.11)
RDW: 19.7 % — ABNORMAL HIGH (ref 11.5–15.5)
WBC: 7.2 10*3/uL (ref 4.0–10.5)

## 2016-12-15 LAB — TYPE AND SCREEN
ABO/RH(D): O NEG
Antibody Screen: NEGATIVE

## 2016-12-15 NOTE — Pre-Procedure Instructions (Signed)
SDS BB history Log given to lab for patient's previous history of blood transfusion at [facility] in 09/2016, 2 units transfused.

## 2016-12-17 NOTE — H&P (Signed)
Office Visit   12/11/2016 Bath Silva, Everardo All, MD  Obstetrics and Gynecology   Menorrhagia with irregular cycle  Dx   Office Visit ; Referred by Abner Greenspan, MD  Reason for Visit   Additional Documentation   Vitals:   BP 118/78 (BP Location: Right Arm, Patient Position: Sitting, Cuff Size: Large)   Pulse 88   Resp 16   Wt 253 lb (114.8 kg)   BMI 42.10 kg/m   BSA 2.29 m      More Vitals   Flowsheets:   Infectious Disease Screening,   MEWS Score,   Custom Formula Data,   Anthropometrics     Encounter Info:   Billing Info,   History,   Allergies,   Detailed Report     All Notes   Progress Notes by Nunzio Cobbs, MD at 12/11/2016 3:45 PM   Author: Nunzio Cobbs, MD Author Type: Physician Filed: 12/12/2016 7:19 PM  Note Status: Signed Cosign: Cosign Not Required Encounter Date: 12/11/2016  Editor: Nunzio Cobbs, MD (Physician)  Prior Versions: 1. Archie Balboa, CMA (Certified Psychologist, sport and exercise) at 12/11/2016 3:56 PM - Sign at close encounter   2. Lowella Fairy, CMA (Certified Psychologist, sport and exercise) at 12/11/2016 8:51 AM - Sign at close encounter    GYNECOLOGY  VISIT   HPI: 46 y.o.   Married  Caucasian  female   [id number] with No LMP recorded. Patient is not currently having periods (Reason: Irregular Periods).   here for surgical consult.     Patient desires definitive treatment for her vaginal bleeding and declines future childbearing.   Presented for her annual exam September 27, 2016 with bleeding since May 2018.  She bled from 08/17/16 - 09/17/16. Was noted to be severely anemic and was admitted for transfusion of packed red blood cells. She was not bleeding at the time of her annual examination.   She subsequently had a pelvic US, sonohysterogram, and EMB.  Her EMS was 10 mm and she had no filling defects.  The EMB showed disordered proliferative endometrium and focal glandular crowding.     She started Aygestin 5 mg po bid and continued to have bleeding, so the dosage was increased to Aygestin 5 mg po tid starting on 11/16/16.  Now on Aygestin 15 mg daily.  Bleeding profile is better per patient. Decreased energy but feeling overall ok.   She has not started her iron supplement from Dr. Forde Dandy who has cleared her for hysterectomy.   GYNECOLOGIC HISTORY: No LMP recorded. Patient is not currently having periods (Reason: Irregular Periods). Contraception:  Vasectomy Menopausal hormone therapy: she is on Aygestin 15mg  q am Last mammogram: 03/24/16 - BIRADS 1 neg/density B Last pap smear: 09/27/16 - normal, HR HPV negative 11-27-14 Neg:Neg HR HPV                OB History    Gravida Para Term Preterm AB Living   2 2 0 0 0 2   SAB TAB Ectopic Multiple Live Births   0 0 0 0 0             Patient Active Problem List   Diagnosis Date Noted  . Iron deficiency anemia 09/30/2016  . Insomnia 07/22/2016  . Auditory processing disorder 04/04/2016  . Encounter for routine gynecological examination 11/27/2014  . Morbid obesity ([place]) 12/13/2012  . Hyperglycemia 12/13/2012  . Other screening mammogram 12/12/2011  .  Routine general medical examination at a health care facility 12/04/2011  . HYPERTRIGLYCERIDEMIA 04/07/2008  . PROBLEMS WITH HEARING 04/07/2008  . MAMMOGRAM, ABNORMAL 03/18/2008  . Anxiety disorder 02/07/2008  . ALLERGIC RHINITIS 02/07/2008  . HYPOGLYCEMIA, REACTIVE 11/15/2006        Past Medical History:  Diagnosis Date  . Anxiety   . History of allergic rhinitis   . History of anxiety   . History of depression   . History of hyperlipidemia   . History of syncope          Past Surgical History:  Procedure Laterality Date  . CESAREAN SECTION            Current Outpatient Prescriptions  Medication Sig Dispense Refill  . atorvastatin (LIPITOR) 20 MG tablet Take 1 tablet (20 mg total) by mouth daily.  90 tablet 3  . Choline Fenofibrate (FENOFIBRIC ACID) 135 MG CPDR Take 125 mg by mouth daily. 90 capsule 3  . escitalopram (LEXAPRO) 20 MG tablet Take 1 tablet (20 mg total) by mouth daily. 90 tablet 3  . FERREX 150 150 MG capsule Take by mouth daily.  6  . miglitol (GLYSET) 25 MG tablet TAKE 1 TABLET (25 MG TOTAL) BY MOUTH 2 (TWO) TIMES DAILY. (Patient taking differently: TAKE 1 TABLET (25 MG TOTAL) BY MOUTHDAILY.) 60 tablet 0  . norethindrone (AYGESTIN) 5 MG tablet Take 3 tablets (15 mg) by mouth daily. 42 tablet 0   No current facility-administered medications for this visit.      ALLERGIES: Penicillins       Family History  Problem Relation Age of Onset  . Diabetes Father   . Diabetes Paternal Grandfather   . Prostate cancer Paternal Grandfather   . Diabetes Paternal Grandmother     Social History        Social History  . Marital status: Married    Spouse name: N/A  . Number of children: N/A  . Years of education: N/A      Occupational History  . Not on file.         Social History Main Topics  . Smoking status: Never Smoker  . Smokeless tobacco: Never Used  . Alcohol use 0.0 oz/week     Comment: occasional  . Drug use: No  . Sexual activity: Yes    Birth control/ protection: Surgical     Comment: Vasectomy       Other Topics Concern  . Not on file      Social History Narrative  . No narrative on file    ROS:  Pertinent items are noted in HPI.  PHYSICAL EXAMINATION:    BP 118/78 (BP Location: Right Arm, Patient Position: Sitting, Cuff Size: Large)   Pulse 88   Resp 16   Wt 253 lb (114.8 kg)   BMI 42.10 kg/m     General appearance: alert, cooperative and appears stated age Head: Normocephalic, without obvious abnormality, atraumatic Neck: no adenopathy, supple, symmetrical, trachea midline and thyroid normal to inspection and palpation Lungs: clear to auscultation bilaterally Heart: regular rate and rhythm Abdomen:  obese, soft, non-tender, no masses,  no organomegaly Extremities: extremities normal, atraumatic, no cyanosis or edema Skin: Skin color, texture, turgor normal. No rashes or lesions Lymph nodes: Cervical, supraclavicular, and axillary nodes normal. No abnormal inguinal nodes palpated Neurologic: Grossly normal  Pelvic: External genitalia:  no lesions              Urethra:  normal appearing urethra with no masses,  tenderness or lesions              Bartholins and Skenes: normal                 Vagina: normal appearing vagina with normal color and discharge, no lesions              Cervix: no lesions.  Moderate menstrual flow.                 Bimanual Exam:  Uterus:  normal size, contour, position, consistency, mobility, non-tender              Adnexa: no mass, fullness, tenderness                Chaperone was present for exam.  ASSESSMENT  Menorrhagia with irregular menses. Thin walled right ovarian cyst by [place] on 10/05/16. Status post transfusion of 2 units paced red blood cells.  Hx Cesarean Section.  AODM.    PLAN  Plan for total laparoscopic hysterectomy with bilateral salpingectomy, possible bilateral oophorectomy if ovaries are abnormal, cystoscopy.   I discussed possible risk of laparotomy.  We reviewed again her history of Cesarean Section and possible increased risk of bladder injury.  Risks, benefits, and alternatives to hysterectomy discussed with the patient who wishes to proceed.  Surgical expectations, hospital stay, and recovery discussed.  She will be out of work for 6 weeks.  Continue Aygestin 15 mg daily.  She will do a Mg Citrate bowel prep pre-op.  Plan for Lovenox.   An After Visit Summary was printed and given to the patient.

## 2016-12-18 ENCOUNTER — Other Ambulatory Visit: Payer: Self-pay | Admitting: Family Medicine

## 2016-12-18 ENCOUNTER — Encounter (HOSPITAL_COMMUNITY): Payer: Self-pay | Admitting: Anesthesiology

## 2016-12-18 NOTE — Anesthesia Preprocedure Evaluation (Addendum)
Anesthesia Evaluation  Patient identified by MRN, date of birth, ID band Patient awake    Reviewed: Allergy & Precautions, NPO status , Patient's Chart, lab work & pertinent test results  Airway Mallampati: II       Dental no notable dental hx. (+) Teeth Intact   Pulmonary    Pulmonary exam normal breath sounds clear to auscultation       Cardiovascular negative cardio ROS Normal cardiovascular exam Rhythm:Regular Rate:Normal     Neuro/Psych    GI/Hepatic negative GI ROS, Neg liver ROS,   Endo/Other  diabetes, Type 2Morbid obesity  Renal/GU negative Renal ROS     Musculoskeletal negative musculoskeletal ROS (+)   Abdominal (+) + obese,   Peds  Hematology   Anesthesia Other Findings   Reproductive/Obstetrics negative OB ROS                            Anesthesia Physical Anesthesia Plan  ASA: III  Anesthesia Plan: General   Post-op Pain Management:    Induction: Intravenous  PONV Risk Score and Plan: 4 or greater and Ondansetron, Dexamethasone, Midazolam, Scopolamine patch - Pre-op and Propofol infusion  Airway Management Planned: Oral ETT  Additional Equipment:   Intra-op Plan:   Post-operative Plan: Extubation in OR  Informed Consent: I have reviewed the patients History and Physical, chart, labs and discussed the procedure including the risks, benefits and alternatives for the proposed anesthesia with the patient or authorized representative who has indicated his/her understanding and acceptance.   Dental advisory given  Plan Discussed with: CRNA and Surgeon  Anesthesia Plan Comments:        Anesthesia Quick Evaluation

## 2016-12-19 ENCOUNTER — Ambulatory Visit (HOSPITAL_COMMUNITY): Payer: BLUE CROSS/BLUE SHIELD | Admitting: Anesthesiology

## 2016-12-19 ENCOUNTER — Telehealth: Payer: Self-pay | Admitting: Obstetrics and Gynecology

## 2016-12-19 ENCOUNTER — Ambulatory Visit (HOSPITAL_COMMUNITY)
Admission: RE | Admit: 2016-12-19 | Discharge: 2016-12-19 | Disposition: A | Discharge status: Home / Self Care | Payer: BLUE CROSS/BLUE SHIELD | Source: Ambulatory Visit | Attending: Obstetrics and Gynecology | Admitting: Obstetrics and Gynecology

## 2016-12-19 ENCOUNTER — Encounter (HOSPITAL_COMMUNITY): Payer: Self-pay | Admitting: *Deleted

## 2016-12-19 ENCOUNTER — Encounter (HOSPITAL_COMMUNITY)
Admission: RE | Disposition: A | Discharge status: Home / Self Care | Payer: Self-pay | Source: Ambulatory Visit | Attending: Obstetrics and Gynecology

## 2016-12-19 DIAGNOSIS — Z6841 Body Mass Index (BMI) 40.0 and over, adult: Secondary | ICD-10-CM | POA: Diagnosis not present

## 2016-12-19 DIAGNOSIS — N83201 Unspecified ovarian cyst, right side: Secondary | ICD-10-CM | POA: Diagnosis not present

## 2016-12-19 DIAGNOSIS — E119 Type 2 diabetes mellitus without complications: Secondary | ICD-10-CM | POA: Insufficient documentation

## 2016-12-19 DIAGNOSIS — G4700 Insomnia, unspecified: Secondary | ICD-10-CM | POA: Insufficient documentation

## 2016-12-19 DIAGNOSIS — Z90710 Acquired absence of both cervix and uterus: Secondary | ICD-10-CM | POA: Diagnosis present

## 2016-12-19 DIAGNOSIS — D259 Leiomyoma of uterus, unspecified: Secondary | ICD-10-CM | POA: Diagnosis not present

## 2016-12-19 DIAGNOSIS — D649 Anemia, unspecified: Secondary | ICD-10-CM | POA: Diagnosis not present

## 2016-12-19 DIAGNOSIS — F419 Anxiety disorder, unspecified: Secondary | ICD-10-CM | POA: Insufficient documentation

## 2016-12-19 DIAGNOSIS — N800 Endometriosis of uterus: Secondary | ICD-10-CM | POA: Diagnosis not present

## 2016-12-19 DIAGNOSIS — N920 Excessive and frequent menstruation with regular cycle: Secondary | ICD-10-CM | POA: Diagnosis not present

## 2016-12-19 DIAGNOSIS — Z880 Allergy status to penicillin: Secondary | ICD-10-CM | POA: Diagnosis not present

## 2016-12-19 DIAGNOSIS — N939 Abnormal uterine and vaginal bleeding, unspecified: Secondary | ICD-10-CM | POA: Diagnosis not present

## 2016-12-19 DIAGNOSIS — D251 Intramural leiomyoma of uterus: Secondary | ICD-10-CM | POA: Insufficient documentation

## 2016-12-19 DIAGNOSIS — D509 Iron deficiency anemia, unspecified: Secondary | ICD-10-CM | POA: Diagnosis not present

## 2016-12-19 DIAGNOSIS — F329 Major depressive disorder, single episode, unspecified: Secondary | ICD-10-CM | POA: Insufficient documentation

## 2016-12-19 DIAGNOSIS — N938 Other specified abnormal uterine and vaginal bleeding: Secondary | ICD-10-CM | POA: Diagnosis not present

## 2016-12-19 DIAGNOSIS — N921 Excessive and frequent menstruation with irregular cycle: Secondary | ICD-10-CM | POA: Diagnosis not present

## 2016-12-19 DIAGNOSIS — Z79899 Other long term (current) drug therapy: Secondary | ICD-10-CM | POA: Diagnosis not present

## 2016-12-19 HISTORY — PX: CYSTOSCOPY: SHX5120

## 2016-12-19 HISTORY — PX: TOTAL LAPAROSCOPIC HYSTERECTOMY WITH SALPINGECTOMY: SHX6742

## 2016-12-19 LAB — GLUCOSE, CAPILLARY
GLUCOSE-CAPILLARY: 140 mg/dL — AB (ref 65–99)
Glucose-Capillary: 152 mg/dL — ABNORMAL HIGH (ref 65–99)
Glucose-Capillary: 115 mg/dL — ABNORMAL HIGH (ref 65–99)

## 2016-12-19 LAB — CBC
HEMATOCRIT: 32.2 % — AB (ref 36.0–46.0)
HEMOGLOBIN: 10.4 g/dL — AB (ref 12.0–15.0)
MCH: 26.9 pg (ref 26.0–34.0)
MCHC: 32.3 g/dL (ref 30.0–36.0)
MCV: 83.2 fL (ref 78.0–100.0)
PLATELETS: 303 10*3/uL (ref 150–400)
RBC: 3.87 MIL/uL (ref 3.87–5.11)
RDW: 18.2 % — ABNORMAL HIGH (ref 11.5–15.5)
WBC: 10.1 10*3/uL (ref 4.0–10.5)

## 2016-12-19 LAB — PREGNANCY, URINE: Preg Test, Ur: NEGATIVE

## 2016-12-19 LAB — TYPE AND SCREEN
ABO/RH(D): O NEG
Antibody Screen: NEGATIVE

## 2016-12-19 SURGERY — HYSTERECTOMY, TOTAL, LAPAROSCOPIC, WITH SALPINGECTOMY
Anesthesia: General | Site: Bladder

## 2016-12-19 MED ORDER — ROPIVACAINE HCL 5 MG/ML IJ SOLN
INTRAMUSCULAR | Status: AC
  Filled 2016-12-19: qty 30

## 2016-12-19 MED ORDER — MORPHINE SULFATE (PF) 4 MG/ML IV SOLN: 2.0000 mg | INTRAVENOUS | Status: DC | PRN

## 2016-12-19 MED ORDER — OXYCODONE-ACETAMINOPHEN 5-325 MG PO TABS: 1.0000 | ORAL_TABLET | ORAL | 0 refills | Status: DC | PRN

## 2016-12-19 MED ORDER — ONDANSETRON HCL 4 MG/2ML IJ SOLN
INTRAMUSCULAR | Status: AC
  Filled 2016-12-19: qty 2

## 2016-12-19 MED ORDER — ONDANSETRON HCL 4 MG/2ML IJ SOLN: 4.0000 mg | Freq: Four times a day (QID) | INTRAMUSCULAR | Status: DC | PRN

## 2016-12-19 MED ORDER — ONDANSETRON HCL 4 MG PO TABS: 4.0000 mg | ORAL_TABLET | Freq: Four times a day (QID) | ORAL | Status: DC | PRN

## 2016-12-19 MED ORDER — MIDAZOLAM HCL 2 MG/2ML IJ SOLN
INTRAMUSCULAR | Status: DC | PRN
  Administered 2016-12-19: 2 mg via INTRAVENOUS

## 2016-12-19 MED ORDER — KETOROLAC TROMETHAMINE 30 MG/ML IJ SOLN
INTRAMUSCULAR | Status: DC | PRN
  Administered 2016-12-19: 30 mg via INTRAVENOUS

## 2016-12-19 MED ORDER — PHENYLEPHRINE 40 MCG/ML (10ML) SYRINGE FOR IV PUSH (FOR BLOOD PRESSURE SUPPORT)
PREFILLED_SYRINGE | INTRAVENOUS | Status: AC
  Filled 2016-12-19: qty 10

## 2016-12-19 MED ORDER — FENTANYL CITRATE (PF) 100 MCG/2ML IJ SOLN
INTRAMUSCULAR | Status: DC | PRN
  Administered 2016-12-19 (×2): 50 ug via INTRAVENOUS
  Administered 2016-12-19: 100 ug via INTRAVENOUS

## 2016-12-19 MED ORDER — PROPOFOL 10 MG/ML IV BOLUS
INTRAVENOUS | Status: AC
  Filled 2016-12-19: qty 20

## 2016-12-19 MED ORDER — SODIUM CHLORIDE 0.9 % IR SOLN
Status: DC | PRN
  Administered 2016-12-19: 3000 mL

## 2016-12-19 MED ORDER — LIDOCAINE HCL (CARDIAC) 20 MG/ML IV SOLN
INTRAVENOUS | Status: AC
  Filled 2016-12-19: qty 5

## 2016-12-19 MED ORDER — SCOPOLAMINE 1 MG/3DAYS TD PT72
MEDICATED_PATCH | TRANSDERMAL | Status: AC
  Administered 2016-12-19: 1.5 mg
  Filled 2016-12-19: qty 1

## 2016-12-19 MED ORDER — LACTATED RINGERS IV SOLN
INTRAVENOUS | Status: DC
  Administered 2016-12-19: 12:00:00 via INTRAVENOUS

## 2016-12-19 MED ORDER — OXYCODONE-ACETAMINOPHEN 5-325 MG PO TABS: 1.0000 | ORAL_TABLET | ORAL | Status: DC | PRN

## 2016-12-19 MED ORDER — FENOFIBRATE 160 MG PO TABS
160.0000 mg | ORAL_TABLET | Freq: Every day | ORAL | Status: DC
  Filled 2016-12-19: qty 1

## 2016-12-19 MED ORDER — HYDROMORPHONE HCL 1 MG/ML IJ SOLN
INTRAMUSCULAR | Status: DC | PRN
  Administered 2016-12-19 (×2): 0.5 mg via INTRAVENOUS

## 2016-12-19 MED ORDER — IBUPROFEN 600 MG PO TABS: 600.0000 mg | ORAL_TABLET | Freq: Four times a day (QID) | ORAL | 0 refills | Status: DC | PRN

## 2016-12-19 MED ORDER — LIDOCAINE HCL (CARDIAC) 20 MG/ML IV SOLN
INTRAVENOUS | Status: DC | PRN
  Administered 2016-12-19: 80 mg via INTRAVENOUS

## 2016-12-19 MED ORDER — SUGAMMADEX SODIUM 200 MG/2ML IV SOLN
INTRAVENOUS | Status: DC | PRN
  Administered 2016-12-19: 200 mg via INTRAVENOUS

## 2016-12-19 MED ORDER — PHENYLEPHRINE 40 MCG/ML (10ML) SYRINGE FOR IV PUSH (FOR BLOOD PRESSURE SUPPORT)
PREFILLED_SYRINGE | INTRAVENOUS | Status: DC | PRN
  Administered 2016-12-19 (×5): 80 ug via INTRAVENOUS
  Administered 2016-12-19: 40 ug via INTRAVENOUS

## 2016-12-19 MED ORDER — PROMETHAZINE HCL 25 MG/ML IJ SOLN: 6.2500 mg | INTRAMUSCULAR | Status: DC | PRN

## 2016-12-19 MED ORDER — HYDROMORPHONE HCL 1 MG/ML IJ SOLN: 0.2500 mg | INTRAMUSCULAR | Status: DC | PRN

## 2016-12-19 MED ORDER — ROCURONIUM BROMIDE 100 MG/10ML IV SOLN
INTRAVENOUS | Status: AC
  Filled 2016-12-19: qty 1

## 2016-12-19 MED ORDER — LACTATED RINGERS IV SOLN
INTRAVENOUS | Status: DC
  Administered 2016-12-19 (×2): via INTRAVENOUS

## 2016-12-19 MED ORDER — ROCURONIUM BROMIDE 100 MG/10ML IV SOLN
INTRAVENOUS | Status: DC | PRN
  Administered 2016-12-19: 40 mg via INTRAVENOUS
  Administered 2016-12-19: 10 mg via INTRAVENOUS
  Administered 2016-12-19: 20 mg via INTRAVENOUS

## 2016-12-19 MED ORDER — SODIUM CHLORIDE 0.9 % IV SOLN
INTRAVENOUS | Status: DC | PRN
  Administered 2016-12-19: 60 mL

## 2016-12-19 MED ORDER — ATORVASTATIN CALCIUM 20 MG PO TABS
20.0000 mg | ORAL_TABLET | Freq: Every day | ORAL | Status: DC
  Filled 2016-12-19: qty 1

## 2016-12-19 MED ORDER — CIPROFLOXACIN IN D5W 400 MG/200ML IV SOLN
400.0000 mg | INTRAVENOUS | Status: AC
  Administered 2016-12-19: 400 mg via INTRAVENOUS
  Filled 2016-12-19: qty 200

## 2016-12-19 MED ORDER — ESCITALOPRAM OXALATE 20 MG PO TABS
20.0000 mg | ORAL_TABLET | Freq: Every day | ORAL | Status: DC
  Administered 2016-12-19: 20 mg via ORAL
  Filled 2016-12-19 (×2): qty 1

## 2016-12-19 MED ORDER — SUGAMMADEX SODIUM 200 MG/2ML IV SOLN
INTRAVENOUS | Status: AC
  Filled 2016-12-19: qty 2

## 2016-12-19 MED ORDER — SODIUM CHLORIDE 0.9% FLUSH
INTRAVENOUS | Status: DC | PRN
  Administered 2016-12-19: 10 mL

## 2016-12-19 MED ORDER — PROPOFOL 10 MG/ML IV BOLUS
INTRAVENOUS | Status: DC | PRN
  Administered 2016-12-19: 180 mg via INTRAVENOUS

## 2016-12-19 MED ORDER — FENOFIBRIC ACID 135 MG PO CPDR: 125.0000 mg | DELAYED_RELEASE_CAPSULE | Freq: Every day | ORAL | Status: DC

## 2016-12-19 MED ORDER — EPHEDRINE SULFATE 50 MG/ML IJ SOLN
INTRAMUSCULAR | Status: DC | PRN
  Administered 2016-12-19 (×3): 10 mg via INTRAVENOUS

## 2016-12-19 MED ORDER — SCOPOLAMINE 1 MG/3DAYS TD PT72
1.0000 | MEDICATED_PATCH | Freq: Once | TRANSDERMAL | Status: DC
  Administered 2016-12-19: 1.5 mg via TRANSDERMAL

## 2016-12-19 MED ORDER — LACTATED RINGERS IV SOLN
INTRAVENOUS | Status: DC
  Administered 2016-12-19: 07:00:00 via INTRAVENOUS

## 2016-12-19 MED ORDER — IBUPROFEN 600 MG PO TABS: 600.0000 mg | ORAL_TABLET | Freq: Four times a day (QID) | ORAL | Status: DC | PRN

## 2016-12-19 MED ORDER — EPHEDRINE 5 MG/ML INJ
INTRAVENOUS | Status: AC
  Filled 2016-12-19: qty 10

## 2016-12-19 MED ORDER — BUPIVACAINE HCL (PF) 0.25 % IJ SOLN
INTRAMUSCULAR | Status: AC
  Filled 2016-12-19: qty 30

## 2016-12-19 MED ORDER — KETOROLAC TROMETHAMINE 30 MG/ML IJ SOLN
30.0000 mg | Freq: Four times a day (QID) | INTRAMUSCULAR | Status: DC
  Administered 2016-12-19: 30 mg via INTRAVENOUS
  Filled 2016-12-19: qty 1

## 2016-12-19 MED ORDER — BUPIVACAINE HCL (PF) 0.25 % IJ SOLN
INTRAMUSCULAR | Status: DC | PRN
  Administered 2016-12-19: 10 mL

## 2016-12-19 MED ORDER — SODIUM CHLORIDE 0.9 % IJ SOLN
INTRAMUSCULAR | Status: AC
  Filled 2016-12-19: qty 50

## 2016-12-19 MED ORDER — HYDROMORPHONE HCL 1 MG/ML IJ SOLN
INTRAMUSCULAR | Status: AC
  Filled 2016-12-19: qty 1

## 2016-12-19 MED ORDER — ONDANSETRON HCL 4 MG/2ML IJ SOLN
INTRAMUSCULAR | Status: DC | PRN
  Administered 2016-12-19: 4 mg via INTRAVENOUS

## 2016-12-19 MED ORDER — MIDAZOLAM HCL 2 MG/2ML IJ SOLN
INTRAMUSCULAR | Status: AC
  Filled 2016-12-19: qty 2

## 2016-12-19 MED ORDER — MEPERIDINE HCL 25 MG/ML IJ SOLN: 6.2500 mg | INTRAMUSCULAR | Status: DC | PRN

## 2016-12-19 MED ORDER — KETOROLAC TROMETHAMINE 30 MG/ML IJ SOLN
30.0000 mg | Freq: Once | INTRAMUSCULAR | Status: DC | PRN
Start: 2016-12-19 — End: 2016-12-19

## 2016-12-19 MED ORDER — METRONIDAZOLE IN NACL 5-0.79 MG/ML-% IV SOLN
500.0000 mg | INTRAVENOUS | Status: AC
Start: 2016-12-19 — End: 2016-12-19
  Administered 2016-12-19: 500 mg via INTRAVENOUS
  Filled 2016-12-19: qty 100

## 2016-12-19 MED ORDER — FENTANYL CITRATE (PF) 250 MCG/5ML IJ SOLN
INTRAMUSCULAR | Status: AC
  Filled 2016-12-19: qty 5

## 2016-12-19 MED ORDER — STERILE WATER FOR IRRIGATION IR SOLN
Status: DC | PRN
  Administered 2016-12-19: 1000 mL via INTRAVESICAL

## 2016-12-19 MED ORDER — ENOXAPARIN SODIUM 40 MG/0.4ML ~~LOC~~ SOLN
40.0000 mg | SUBCUTANEOUS | Status: AC
  Administered 2016-12-19: 40 mg via SUBCUTANEOUS
  Filled 2016-12-19: qty 0.4

## 2016-12-19 MED ORDER — MENTHOL 3 MG MT LOZG: 1.0000 | LOZENGE | OROMUCOSAL | Status: DC | PRN

## 2016-12-19 SURGICAL SUPPLY — 59 items
ADH SKN CLS APL DERMABOND .7 (GAUZE/BANDAGES/DRESSINGS) ×2
APL SRG 38 LTWT LNG FL B (MISCELLANEOUS) ×2
APPLICATOR ARISTA FLEXITIP XL (MISCELLANEOUS) ×1 IMPLANT
BARRIER ADHS 3X4 INTERCEED (GAUZE/BANDAGES/DRESSINGS) IMPLANT
BRR ADH 4X3 ABS CNTRL BYND (GAUZE/BANDAGES/DRESSINGS)
CABLE HIGH FREQUENCY MONO STRZ (ELECTRODE) ×3 IMPLANT
CANISTER SUCT 3000ML PPV (MISCELLANEOUS) ×3 IMPLANT
CELL SAVER LIPIGURD (MISCELLANEOUS) IMPLANT
CLOTH BEACON ORANGE TIMEOUT ST (SAFETY) ×3 IMPLANT
COVER MAYO STAND STRL (DRAPES) ×3 IMPLANT
COVER TABLE BACK 60X90 (DRAPES) IMPLANT
DECANTER SPIKE VIAL GLASS SM (MISCELLANEOUS) ×7 IMPLANT
DERMABOND ADVANCED (GAUZE/BANDAGES/DRESSINGS) ×1
DERMABOND ADVANCED .7 DNX12 (GAUZE/BANDAGES/DRESSINGS) ×2 IMPLANT
DEVICE RETRIEVAL ALEXIS 14 (MISCELLANEOUS) IMPLANT
DURAPREP 26ML APPLICATOR (WOUND CARE) ×3 IMPLANT
EXTRT SYSTEM ALEXIS 14CM (MISCELLANEOUS)
EXTRT SYSTEM ALEXIS 17CM (MISCELLANEOUS)
GLOVE BIO SURGEON STRL SZ 6.5 (GLOVE) ×7 IMPLANT
GLOVE BIOGEL PI IND STRL 7.0 (GLOVE) ×6 IMPLANT
GLOVE BIOGEL PI INDICATOR 7.0 (GLOVE) ×3
GOWN STRL REUS W/TWL LRG LVL3 (GOWN DISPOSABLE) ×13 IMPLANT
HEMOSTAT ARISTA ABSORB 3G PWDR (MISCELLANEOUS) ×1 IMPLANT
LIGASURE VESSEL 5MM BLUNT TIP (ELECTROSURGICAL) ×3 IMPLANT
NEEDLE INSUFFLATION 120MM (ENDOMECHANICALS) ×3 IMPLANT
OCCLUDER COLPOPNEUMO (BALLOONS) ×3 IMPLANT
PACK LAPAROSCOPY BASIN (CUSTOM PROCEDURE TRAY) ×3 IMPLANT
PACK TRENDGUARD 450 HYBRID PRO (MISCELLANEOUS) IMPLANT
PACK TRENDGUARD 600 HYBRD PROC (MISCELLANEOUS) IMPLANT
POUCH LAPAROSCOPIC INSTRUMENT (MISCELLANEOUS) ×3 IMPLANT
PROTECTOR NERVE ULNAR (MISCELLANEOUS) ×6 IMPLANT
SCISSORS LAP 5X35 DISP (ENDOMECHANICALS) ×3 IMPLANT
SET CYSTO W/LG BORE CLAMP LF (SET/KITS/TRAYS/PACK) ×3 IMPLANT
SET IRRIG TUBING LAPAROSCOPIC (IRRIGATION / IRRIGATOR) ×3 IMPLANT
SET TRI-LUMEN FLTR TB AIRSEAL (TUBING) ×3 IMPLANT
SLEEVE ADV FIXATION 5X100MM (TROCAR) ×3 IMPLANT
SUT VIC AB 0 CT1 27 (SUTURE) ×6
SUT VIC AB 0 CT1 27XBRD ANBCTR (SUTURE) ×4 IMPLANT
SUT VICRYL 0 UR6 27IN ABS (SUTURE) ×3 IMPLANT
SUT VICRYL 4-0 PS2 18IN ABS (SUTURE) ×3 IMPLANT
SUT VLOC 180 0 9IN  GS21 (SUTURE) ×1
SUT VLOC 180 0 9IN GS21 (SUTURE) IMPLANT
SYR 50ML LL SCALE MARK (SYRINGE) ×6 IMPLANT
SYRINGE 10CC LL (SYRINGE) ×3 IMPLANT
SYSTEM CARTER THOMASON II (TROCAR) ×1 IMPLANT
SYSTEM CONTND EXTRCTN KII BLLN (MISCELLANEOUS) IMPLANT
TIP RUMI ORANGE 6.7MMX12CM (TIP) IMPLANT
TIP UTERINE 5.1X6CM LAV DISP (MISCELLANEOUS) IMPLANT
TIP UTERINE 6.7X10CM GRN DISP (MISCELLANEOUS) IMPLANT
TIP UTERINE 6.7X6CM WHT DISP (MISCELLANEOUS) IMPLANT
TIP UTERINE 6.7X8CM BLUE DISP (MISCELLANEOUS) ×1 IMPLANT
TOWEL OR 17X24 6PK STRL BLUE (TOWEL DISPOSABLE) ×6 IMPLANT
TRAY FOLEY CATH SILVER 14FR (SET/KITS/TRAYS/PACK) ×3 IMPLANT
TRENDGUARD 450 HYBRID PRO PACK (MISCELLANEOUS) ×3
TRENDGUARD 600 HYBRID PROC PK (MISCELLANEOUS)
TROCAR ADV FIXATION 5X100MM (TROCAR) ×3 IMPLANT
TROCAR PORT AIRSEAL 8X120 (TROCAR) ×3 IMPLANT
TROCAR XCEL NON-BLD 5MMX100MML (ENDOMECHANICALS) ×3 IMPLANT
WARMER LAPAROSCOPE (MISCELLANEOUS) ×3 IMPLANT

## 2016-12-19 NOTE — Anesthesia Postprocedure Evaluation (Signed)
Anesthesia Post Note  Patient: Gail [name]  Procedure(s) Performed: HYSTERECTOMY TOTAL LAPAROSCOPIC WITH SALPINGECTOMY (Bilateral Abdomen) CYSTOSCOPY (N/A Bladder)     Patient location during evaluation: [facility] Anesthesia Type: General Level of consciousness: awake, awake and alert and oriented Pain management: pain level controlled Vital Signs Assessment: post-procedure vital signs reviewed and stable Respiratory status: spontaneous breathing, nonlabored ventilation and respiratory function stable Cardiovascular status: stable Postop Assessment: no headache, no backache, no apparent nausea or vomiting, adequate PO intake and patient able to bend at knees Anesthetic complications: no    Last Vitals:  Vitals:   12/19/16 1210 12/19/16 1300  BP: 133/81 124/67  Pulse: (!) 109 92  Resp: 16 16  Temp: 36.8 C 37.1 C  SpO2: 98% 97%    Last Pain:  Vitals:   12/19/16 1400  TempSrc:   PainSc: 0-No pain   Pain Goal: Patients Stated Pain Goal: 4 (12/19/16 1224)               Tashiya Souders

## 2016-12-19 NOTE — OR Nursing (Signed)
Pt husband updated at [phone] by CFrediani,RN per Dr. Quincy Simmonds

## 2016-12-19 NOTE — Brief Op Note (Signed)
12/19/2016  10:33 AM  PATIENT:  Zenola T [place]ique  46 y.o. female  PRE-OPERATIVE DIAGNOSIS:  Abnornal Uterine Bleeding,anovulatory bleeding, anemia  POST-OPERATIVE DIAGNOSIS:  Abnornal Uterine Bleeding,anovulatory bleeding, anemia  PROCEDURE:  Procedure(s): HYSTERECTOMY TOTAL LAPAROSCOPIC WITH SALPINGECTOMY (Bilateral) CYSTOSCOPY (N/A)  SURGEON:  Surgeon(s) and Role:    * Amundson Raliegh Ip, MD - Primary    * Talbert Nan, Francesca Jewett, MD  PHYSICIAN ASSISTANT: NA  ASSISTANTS: Sumner Boast, MD   ANESTHESIA:   local, general and Intraperitoneal ropivicaine  EBL:  Total I/O In: 2000 [I.V.:2000] Out: 250 [Urine:200; Blood:50]  BLOOD ADMINISTERED:none  DRAINS: none   LOCAL MEDICATIONS USED:  MARCAINE     SPECIMEN:  Source of Specimen:  uterus, cervix, bilateral tubes  DISPOSITION OF SPECIMEN:  PATHOLOGY  COUNTS:  YES  TOURNIQUET:  * No tourniquets in log *  DICTATION: .Note written in EPIC  PLAN OF CARE: Observation status  PATIENT DISPOSITION:  PACU - hemodynamically stable.   Delay start of Pharmacological VTE agent (>24hrs) due to surgical blood loss or risk of bleeding: not applicable

## 2016-12-19 NOTE — Progress Notes (Signed)
Pt discharged with printed instructions. Pt verbalized an understanding. No concerns noted. Rami Waddle L Deklyn Gibbon, RN 

## 2016-12-19 NOTE — Op Note (Signed)
OPERATIVE REPORT   PREOPERATIVE DIAGNOSIS: Menorrhagia with irregular menses, anemia  POSTOPERATIVE DIAGNOSIS: Menorrhagia with irregular menses, anemia  PROCEDURES: Total laparoscopic hysterectomy with bilateral salpingectomy, cystoscopy  SURGEON: Lenard Galloway, M.D.  ASSISTANT: Dorothy Spark, M.D.  ANESTHESIA: General endotracheal, intraperitoneal ropivicaine 30 mL diluted in 30 mL of normal saline, local with 0.25% Marcaine.  IVF:  2000 cc LR.  ESTIMATED BLOOD LOSS:   50 cc.   URINE OUTPUT:  250 cc.   COMPLICATIONS: None.  INDICATIONS FOR THE PROCEDURE:    The patient is a 46 year old P40 Caucasian female who presents with heavy and irregular menses.   She was noted to have severe anemia at her routine annual exam and received a transfusion of 2 units of packed red blood cells for a hemoglobin of 6.5 in July.  She was not bleeding at the time of her examination.  She had an endometrial biopsy showing disordered proliferative endometrium with focal glandular crowding.  No malignancy was identified.  Pelvic ultrasound showed an EMS of 10 mm and normal myometrium.  She had a 34 x 30 mm simple right ovarian cyst.  The patient was initially treated with Aygestin.  She declines further medical therapy and future childbearing, and is requesting hysterectomy procedure.   A plan is made to proceed with a total laparoscopic hysterectomy with bilateral salpingectomy, possible bilateral oophorectomy, and cystoscopy after risks, benefits, and alternatives are reviewed.  FINDINGS:    Laparoscopy revealed a slightly enlarged but uniform uterus, bilateral tubes and ovaries.  The right ovary appeared to have 1 cm simple cyst.  The upper abdomen was normal and demonstrated a normal liver.  The appendix was also normal. There was no endometriosis were seen in the abdomen or pelvis. She had adhesions between the lower uterine segment and the bladder consistent with her prior Cesarean  Section.   Cystoscopy at the termination of the procedure showed the bladder to be normal throughout 360 degrees including the bladder dome and trigone. There was no evidence of any foreign body in the bladder or the urethra. There was no evidence of any lesions  of the bladder or the urethra.  Both of the ureters were noted to be patent bilaterally.  SPECIMENS:    The uterus, cervix, and bilateral tubes were went to pathology.   DESCRIPTION OF PROCEDURE:   The patient was reidentified in the preoperative hold area.  She did receive  Flagyl and Ciprofloxacin for antibiotic prophylaxis. She received Lovenox, TED hose and PAS stockings for DVT prophylaxis.  In the operating room, the patient was placed in the dorsal lithotomy position on the operating room table. The anti-slip pad was used under the patient. Her legs were placed in the [place] stirrups and her arms were both tucked at her sides. The patient received general endotracheal anesthesia. The abdomen and vagina were then sterilely prepped and she was sterilely draped.  A speculum was placed in the vagina and a single-tooth tenaculum was placed on the anterior cervical lip. A suture of 0 Vicryl was placed on each the anterior and the posterior cervical lips. The uterus was sounded to 8 cm. The cervix was then dilated with [facility] dilators. A #8 RUMI tip with a KOH ring was then placed through the cervix and into the uterine cavity without difficulty. The remaining vaginal instruments were then removed. A Foley catheter was placed inside the bladder.  Attention was turned to the abdomen where the umbilical region was injected with 0.25%  Marcaine and a small incision created.  A Veress needle was then used to insufflate the abdomen with CO2 gas after a saline drop test was performed and the fluid flowed freely.  A 5 mm umbilical incision was created with a scalpel after the skin. A 5 mm camera port was then placed using the Optiview. 5  mm incisions were then created in the left upper abdomen and the left lower abdomen after the skin was injected locally with 0.25% Marcaine.  The 5 mm trocars were then placed under visualization of the laparoscope.  An 8 mm trocar was placed in the right lower quadrant after injecting with Marcaine and incising with a scalpel.   Ropivicine was placed inside the peritoneal cavity.   At this time, the patient was placed in Trendelenburg position. An inspection of the abdomen and pelvis was performed. The findings are as noted above.   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  The left fallopian tube was grasped and the Ligasure was used to cauterize and cut through the mesosalpinx and then the proximal tube.  The specimen was sent to pathology.  The left utero-ovarian ligament was similarly cauterized and cut with the Ligasure. The left round ligament was then cauterized and divided with same instrument. Dissection was performed to the anterior and posterior leaves of the broad ligaments using the monopolar laparoscopic scissors. The peritoneum was taken down posteriorly. The incision was carried across the anterior cul- de-sac along the vesicouterine fold and the bladder was dissected away from the cervix using monopolar cautery scissors.  There were adhesions from the patient's prior Cesarean Section and this was performed in stages.    Attention was turned to the patient's right-hand side at this time. The same procedure that was performed on the left side was repeated on the right side with respect to the isolation, cautery, and transection of the vessels and the bladder flap dissection.   The right fallopian tube was removed from the peritoneal cavity as well.  The bladder was retrograde filled to identify the vesicouterine fold.  This allowed for completion of the dissection of the bladder off the cervix.  The  bladder was them emptied.  The uterine arteries were isolated.  The right uterine artery was then coagulated with the Ligasure and transected with the same instrument. This was performed on the left uterine artery at this time as well.   The KOH ring was nicely visible. The colpotomy incision was performed with the laparoscopic monopolar scissors in a circumferential fashion. The specimen was then removed from the peritoneal cavity and was sent to Pathology. The balloon occluder was placed in the vagina. The vaginal cuff was sutured using a running suture of 0 V-Loc. The vagina was closed from the patient's right hand side to the left hand side and then back 2 sutures towards the midline. This provided good full-thickness closure of the vaginal cuff.  The laparoscopic needle for suturing was removed from the peritoneal cavity.  There was a small amount of bleeding in the peritoneum overlying the bladder on the patient's right side.  This responded to monopolar cautery.  The pelvis was irrigated and suctioned.  The pneumoperitoneal was let down.  There was good hemostasis of the operative sites and pedicles.  Arista was placed over the surgical field.   The 8 mm trocar was removed and the [employer] instrument was used to close the fascia  with a 0/0 vicryl suture.   The remaining laparoscopic trocars were removed after the CO2 pneumoperitoneum was released and the patient received manual breaths to remove any remaining gas.  The patient's Foley catheter was removed and cystoscopy was performed and the findings are as noted above. The Foley catheter was left out.   Final inspection of the vagina demonstrated good hemostasis of the vaginal cuff.  The hymenal area was abraded at the 4 and 8:00 positions, and silver nitrate was used to create hemostasis.  All skin incisions were closed with subcuticular sutures of 4-0 Vicryl. Dermabond was placed over the incisions.  This concluded the  patient's procedure. She was extubated and escorted to the recovery room in stable and awake condition. There were no complications to the procedure. All needle, instrument, and sponge counts were correct.

## 2016-12-19 NOTE — Transfer of Care (Signed)
Immediate Anesthesia Transfer of Care Note  Patient: Gail [name]  Procedure(s) Performed: HYSTERECTOMY TOTAL LAPAROSCOPIC WITH SALPINGECTOMY (Bilateral Abdomen) CYSTOSCOPY (N/A Bladder)  Patient Location: PACU  Anesthesia Type:General  Level of Consciousness: sedated  Airway & Oxygen Therapy: Patient Spontanous Breathing and Patient connected to nasal cannula oxygen  Post-op Assessment: Report given to RN  Post vital signs: Reviewed and stable  Last Vitals:  Vitals:   12/19/16 0623  BP: 128/90  Pulse: 89  Resp: 16  Temp: 37 C  SpO2: 99%    Last Pain:  Vitals:   12/19/16 0623  TempSrc: Oral      Patients Stated Pain Goal: 4 (84/53/64 6803)  Complications: No apparent anesthesia complications

## 2016-12-19 NOTE — Telephone Encounter (Signed)
Spoke with Jason [place]ique, and advises FMLA forms have been completed and are ready for pick up. Ok to close

## 2016-12-19 NOTE — Telephone Encounter (Signed)
Patient is having surgery with Dr Quincy Simmonds today and her husband called to check on her FMLA/disability forms.

## 2016-12-19 NOTE — Progress Notes (Signed)
Update to History and Physical  No marked change in status since office preop visit.  Still with vaginal bleeding.  Last Aygestin was yesterday morning.   DM has been well controlled with HgbA1C through Dr. Baldwin Crown office.  States she ate before having her preop labs done at hospital.   Patient examined.   Ok to proceed with surgery.

## 2016-12-19 NOTE — Discharge Instructions (Signed)

## 2016-12-19 NOTE — Anesthesia Procedure Notes (Signed)
Procedure Name: Intubation Date/Time: 12/19/2016 7:28 AM Performed by: Casimer Lanius A Pre-anesthesia Checklist: Patient identified, Emergency Drugs available, Suction available and Patient being monitored Patient Re-evaluated:Patient Re-evaluated prior to induction Oxygen Delivery Method: Circle system utilized Preoxygenation: Pre-oxygenation with 100% oxygen Induction Type: IV induction Ventilation: Mask ventilation without difficulty, Mask ventilation with difficulty and Oral airway inserted - appropriate to patient size Laryngoscope Size: Mac and 3 Grade View: Grade II Tube type: Oral Tube size: 7.0 mm Number of attempts: 1 Airway Equipment and Method: Patient positioned with wedge pillow and Stylet Placement Confirmation: ETT inserted through vocal cords under direct vision,  positive ETCO2 and breath sounds checked- equal and bilateral Secured at: 22 cm Tube secured with: Tape Dental Injury: Teeth and Oropharynx as per pre-operative assessment

## 2016-12-19 NOTE — Progress Notes (Signed)
Day of Surgery Procedure(s) (LRB): HYSTERECTOMY TOTAL LAPAROSCOPIC WITH SALPINGECTOMY (Bilateral) CYSTOSCOPY (N/A)  Subjective: Patient reports tolerating PO and no problems voiding.   Has ambulated to bathroom several times.  Eating granola and clear liquids.  Objective: I have reviewed patient's vital signs, intake and output and labs. Vitals:   12/19/16 1210 12/19/16 1300  BP: 133/81 124/67  Pulse: (!) 109 92  Resp: 16 16  Temp: 98.2 F (36.8 C) 98.8 F (37.1 C)  SpO2: 98% 97%    Hgb 10.4  CBG now 115.  General: alert and cooperative Resp: clear to auscultation bilaterally Cardio: regular rate and rhythm, S1, S2 normal, no murmur, click, rub or gallop GI: soft, non-tender; bowel sounds normal; no masses,  no organomegaly Extremities: PAS and ted hose on.  Vaginal Bleeding: minimal  Assessment: s/p Procedure(s): HYSTERECTOMY TOTAL LAPAROSCOPIC WITH SALPINGECTOMY (Bilateral) CYSTOSCOPY (N/A): progressing well  Plan: Advance diet Encourage ambulation Discharge home Surgical finding and procedure reviewed.   Rx for Percocet and Motrin.  Follow up in 6 days.  Instructions and precautions given in verbal and written form.   LOS: 0 days    Arloa Koh 12/19/2016, 4:43 PM

## 2016-12-20 ENCOUNTER — Encounter (HOSPITAL_COMMUNITY): Payer: Self-pay | Admitting: Obstetrics and Gynecology

## 2016-12-20 ENCOUNTER — Telehealth: Payer: Self-pay | Admitting: Obstetrics and Gynecology

## 2016-12-20 NOTE — Telephone Encounter (Signed)
I left a message for the patient on her cell phone to see how she is doing post op.  She had a laparoscopic hysterectomy yesterday and was discharged to home last evening.  I asked her to call back if she needed anything.

## 2016-12-22 NOTE — Progress Notes (Signed)
GYNECOLOGY  VISIT   HPI: 46 y.o.   Married  Caucasian  female   G2P0002 with Patient's last menstrual period was 12/19/2016 (exact date).   here for 1 week follow up from [place] SALPINGECTOMY (Bilateral Abdomen) CYSTOSCOPY (N/A Bladder).  Hgb 10.4 post op.  Final path - adenomyosis and fibroids.  Getting up and around ok.  Never took Percocet.  Took Motrin for 3 days.  Eating well. BMs normal. Blood sugars doing well.  Voiding well.  Very little vaginal bleeding.   Wants to return to part time work at 4 weeks post op.    GYNECOLOGIC HISTORY: Patient's last menstrual period was 12/19/2016 (exact date). Contraception:  Vasectomy/Hysterectomy Menopausal hormone therapy:  n/a Last mammogram:  03/24/16 - BIRADS 1 neg/density B Last pap smear:  09/27/16 - normal, HR HPV negative                             11-27-14 Neg:Neg HR HPV        OB History    Gravida Para Term Preterm AB Living   2 2 0 0 0 2   SAB TAB Ectopic Multiple Live Births   0 0 0 0 0         Patient Active Problem List   Diagnosis Date Noted  . Status post total abdominal hysterectomy 12/19/2016  . Iron deficiency anemia 09/30/2016  . Insomnia 07/22/2016  . Auditory processing disorder 04/04/2016  . Encounter for routine gynecological examination 11/27/2014  . Morbid obesity ([place]) 12/13/2012  . Hyperglycemia 12/13/2012  . Other screening mammogram 12/12/2011  . Routine general medical examination at a health care facility 12/04/2011  . HYPERTRIGLYCERIDEMIA 04/07/2008  . PROBLEMS WITH HEARING 04/07/2008  . MAMMOGRAM, ABNORMAL 03/18/2008  . Anxiety disorder 02/07/2008  . ALLERGIC RHINITIS 02/07/2008  . HYPOGLYCEMIA, REACTIVE 11/15/2006    Past Medical History:  Diagnosis Date  . Anemia   . Anxiety   . Depression   . Diabetes mellitus without complication ([place])    type 2  . Dyspnea   . Hearing loss    wears hearing aids bilateral  . History of allergic rhinitis   .  History of anxiety   . History of blood transfusion    [facility] 09/2016 2 units transfused  . History of depression   . History of hyperlipidemia   . History of syncope   . Seizures ([place])    hx seizures as a child,no seizures since age 73 yrs old  . Sleep apnea    uses mouth guard at night    Past Surgical History:  Procedure Laterality Date  . [place]   x 2  . CYSTOSCOPY N/A 12/19/2016   Procedure: CYSTOSCOPY;  Surgeon: Nunzio Cobbs, MD;  Location: [facility];  Service: Gynecology;  Laterality: N/A;  . TOTAL LAPAROSCOPIC HYSTERECTOMY WITH SALPINGECTOMY Bilateral 12/19/2016   Procedure: HYSTERECTOMY TOTAL LAPAROSCOPIC WITH SALPINGECTOMY;  Surgeon: Nunzio Cobbs, MD;  Location: [facility];  Service: Gynecology;  Laterality: Bilateral;  . WISDOM TOOTH EXTRACTION      Current Outpatient Prescriptions  Medication Sig Dispense Refill  . atorvastatin (LIPITOR) 20 MG tablet Take 1 tablet (20 mg total) by mouth daily. (Patient taking differently: Take 20 mg by mouth at bedtime. ) 90 tablet 3  . Choline Fenofibrate (FENOFIBRIC ACID) 135 MG CPDR Take 125 mg by mouth daily. (Patient taking differently: Take 135  mg by mouth at bedtime. ) 90 capsule 3  . escitalopram (LEXAPRO) 20 MG tablet Take 1 tablet (20 mg total) by mouth daily. 90 tablet 3  . FERREX 150 150 MG capsule Take 150 mg by mouth at bedtime.   6  . ibuprofen (ADVIL,MOTRIN) 600 MG tablet Take 1 tablet (600 mg total) by mouth every 6 (six) hours as needed (mild pain). (Patient not taking: Reported on 12/25/2016) 30 tablet 0  . miglitol (GLYSET) 25 MG tablet TAKE 1 TABLET (25 MG TOTAL) BY MOUTH 2 (TWO) TIMES DAILY. 60 tablet 2  . oxyCODONE-acetaminophen (PERCOCET/ROXICET) 5-325 MG tablet Take 1-2 tablets by mouth every 4 (four) hours as needed for severe pain (moderate to severe pain (when tolerating fluids)). (Patient not taking: Reported on 12/25/2016) 30 tablet 0   No current facility-administered  medications for this visit.      ALLERGIES: Penicillins  Family History  Problem Relation Age of Onset  . Diabetes Father   . Diabetes Paternal Grandfather   . Prostate cancer Paternal Grandfather   . Diabetes Paternal Grandmother     Social History   Social History  . Marital status: Married    Spouse name: N/A  . Number of children: N/A  . Years of education: N/A   Occupational History  . Not on file.   Social History Main Topics  . Smoking status: Never Smoker  . Smokeless tobacco: Never Used  . Alcohol use 0.0 oz/week     Comment: occasional  winie  . Drug use: No  . Sexual activity: Yes    Birth control/ protection: None     Comment: Husband - Vasectomy   Other Topics Concern  . Not on file   Social History Narrative  . No narrative on file    ROS:  Pertinent items are noted in HPI.  PHYSICAL EXAMINATION:    BP 130/80 (BP Location: Right Arm, Patient Position: Sitting, Cuff Size: Large)   Pulse 90   Ht 5\' 5"  (1.651 m)   Wt 249 lb (112.9 kg)   LMP 12/19/2016 (Exact Date)   BMI 41.44 kg/m     General appearance: alert, cooperative and appears stated age   Abdomen: RLQ incision with ecchymoses and induration. Abdomen is soft, non-tender, no masses,  no organomegaly    ASSESSMENT  Status post laparoscopic hysterectomy with bilateral salpingectomy/cystoscopy.  Anemia. Doing well post op.   PLAN  Discussed post op activity limits.  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  CBC now.  Discussed final pathology report.    An After Visit Summary was printed and given to the patient.

## 2016-12-25 ENCOUNTER — Ambulatory Visit (INDEPENDENT_AMBULATORY_CARE_PROVIDER_SITE_OTHER): Payer: BLUE CROSS/BLUE SHIELD | Admitting: Obstetrics and Gynecology

## 2016-12-25 ENCOUNTER — Encounter: Payer: Self-pay | Admitting: Obstetrics and Gynecology

## 2016-12-25 VITALS — BP 130/80 | HR 90 | Ht 65.0 in | Wt 249.0 lb

## 2016-12-25 DIAGNOSIS — D649 Anemia, unspecified: Secondary | ICD-10-CM | POA: Diagnosis not present

## 2016-12-25 DIAGNOSIS — Z90710 Acquired absence of both cervix and uterus: Secondary | ICD-10-CM

## 2016-12-25 NOTE — Patient Instructions (Signed)
Your uterus showed adenomyosis and fibroids.  There were no abnormal cells seen.

## 2016-12-26 LAB — CBC
HEMOGLOBIN: 11.2 g/dL (ref 11.1–15.9)
Hematocrit: 35.9 % (ref 34.0–46.6)
MCH: 26.2 pg — AB (ref 26.6–33.0)
MCHC: 31.2 g/dL — ABNORMAL LOW (ref 31.5–35.7)
MCV: 84 fL (ref 79–97)
Platelets: 323 10*3/uL (ref 150–379)
RBC: 4.28 x10E6/uL (ref 3.77–5.28)
RDW: 16.6 % — ABNORMAL HIGH (ref 12.3–15.4)
WBC: 6.4 10*3/uL (ref 3.4–10.8)

## 2016-12-27 ENCOUNTER — Telehealth: Payer: Self-pay | Admitting: *Deleted

## 2016-12-27 NOTE — Telephone Encounter (Signed)
Spoke with patient, advised as seen below per Dr. Quincy Simmonds. Patient verbalizes understanding and is agreeable.  Patient is agreeable to disposition. Will close encounter.

## 2016-12-27 NOTE — Telephone Encounter (Signed)
Notes recorded by Burnice Logan, RN on 12/27/2016 at 10:48 AM EDT Left message to call Sharee Pimple at [phone]. See telephone encounter dated 12/27/16.  ------  Notes recorded by Nunzio Cobbs, MD on 12/27/2016 at 6:18 AM EDT Please let patient know that her hemoglobin is normal.  Some of the red blood cell parameters indicate that she is still low in iron.  I would recommend she take the iron supplement that there PCP recommended. She needs to use this for 6 weeks at least.

## 2017-01-15 ENCOUNTER — Encounter: Payer: Self-pay | Admitting: Obstetrics and Gynecology

## 2017-01-15 ENCOUNTER — Ambulatory Visit (INDEPENDENT_AMBULATORY_CARE_PROVIDER_SITE_OTHER): Payer: BLUE CROSS/BLUE SHIELD | Admitting: Obstetrics and Gynecology

## 2017-01-15 VITALS — BP 122/78 | HR 84 | Ht 65.0 in | Wt 253.0 lb

## 2017-01-15 DIAGNOSIS — Z9889 Other specified postprocedural states: Secondary | ICD-10-CM

## 2017-01-15 NOTE — Progress Notes (Signed)
GYNECOLOGY  VISIT   HPI: 46 y.o.   Married  Caucasian  female   G2P0002 with Patient's last menstrual period was 12/19/2016 (exact date).   here for 3 week follow up  HYSTERECTOMY TOTAL LAPAROSCOPIC WITH SALPINGECTOMY (Bilateral Abdomen) [40981 CPT (R)] CYSTOSCOPY (N/A Bladder) [52000 CPT (R)].  Energy level is good.   Wants to return to part time work on 01/19/17.  She brings in a form for me to fill out today for return to work.   Sore if she is up all day.   No vaginal bleeding.   GYNECOLOGIC HISTORY: Patient's last menstrual period was 12/19/2016 (exact date). Contraception:  Vasectomy/Hysterectomy Menopausal hormone therapy:  n/a Last mammogram: 03/24/16 - BIRADS 1 neg/density B  Last pap smear: 09/27/16 - normal, HR HPV negative 11-27-14 Neg:Neg HR HPV        OB History    Gravida Para Term Preterm AB Living   2 2 0 0 0 2   SAB TAB Ectopic Multiple Live Births   0 0 0 0 0         Patient Active Problem List   Diagnosis Date Noted  . Status post total abdominal hysterectomy 12/19/2016  . Iron deficiency anemia 09/30/2016  . Insomnia 07/22/2016  . Auditory processing disorder 04/04/2016  . Encounter for routine gynecological examination 11/27/2014  . Morbid obesity ([place]) 12/13/2012  . Hyperglycemia 12/13/2012  . Other screening mammogram 12/12/2011  . Routine general medical examination at a health care facility 12/04/2011  . HYPERTRIGLYCERIDEMIA 04/07/2008  . PROBLEMS WITH HEARING 04/07/2008  . MAMMOGRAM, ABNORMAL 03/18/2008  . Anxiety disorder 02/07/2008  . ALLERGIC RHINITIS 02/07/2008  . HYPOGLYCEMIA, REACTIVE 11/15/2006    Past Medical History:  Diagnosis Date  . Anemia   . Anxiety   . Depression   . Diabetes mellitus without complication ([place])    type 2  . Dyspnea   . Hearing loss    wears hearing aids bilateral  . History of allergic rhinitis   . History of anxiety   . History of blood transfusion    [facility] 09/2016 2 units  transfused  . History of depression   . History of hyperlipidemia   . History of syncope   . Seizures ([place])    hx seizures as a child,no seizures since age 58 yrs old  . Sleep apnea    uses mouth guard at night    Past Surgical History:  Procedure Laterality Date  . [place]   x 2  . CYSTOSCOPY N/A 12/19/2016   Procedure: CYSTOSCOPY;  Surgeon: Nunzio Cobbs, MD;  Location: [facility];  Service: Gynecology;  Laterality: N/A;  . TOTAL LAPAROSCOPIC HYSTERECTOMY WITH SALPINGECTOMY Bilateral 12/19/2016   Procedure: HYSTERECTOMY TOTAL LAPAROSCOPIC WITH SALPINGECTOMY;  Surgeon: Nunzio Cobbs, MD;  Location: [facility];  Service: Gynecology;  Laterality: Bilateral;  . WISDOM TOOTH EXTRACTION      Current Outpatient Prescriptions  Medication Sig Dispense Refill  . atorvastatin (LIPITOR) 20 MG tablet Take 1 tablet (20 mg total) by mouth daily. (Patient taking differently: Take 20 mg by mouth at bedtime. ) 90 tablet 3  . Choline Fenofibrate (FENOFIBRIC ACID) 135 MG CPDR Take 125 mg by mouth daily. (Patient taking differently: Take 135 mg by mouth at bedtime. ) 90 capsule 3  . escitalopram (LEXAPRO) 20 MG tablet Take 1 tablet (20 mg total) by mouth daily. 90 tablet 3  . FERREX 150 150 MG capsule Take 150  mg by mouth at bedtime.   6  . miglitol (GLYSET) 25 MG tablet TAKE 1 TABLET (25 MG TOTAL) BY MOUTH 2 (TWO) TIMES DAILY. 60 tablet 2   No current facility-administered medications for this visit.      ALLERGIES: Penicillins  Family History  Problem Relation Age of Onset  . Diabetes Father   . Diabetes Paternal Grandfather   . Prostate cancer Paternal Grandfather   . Diabetes Paternal Grandmother     Social History   Social History  . Marital status: Married    Spouse name: N/A  . Number of children: N/A  . Years of education: N/A   Occupational History  . Not on file.   Social History Main Topics  . Smoking status: Never Smoker  . Smokeless  tobacco: Never Used  . Alcohol use 0.0 oz/week     Comment: occasional  winie  . Drug use: No  . Sexual activity: Yes    Birth control/ protection: None     Comment: Husband - Vasectomy   Other Topics Concern  . Not on file   Social History Narrative  . No narrative on file    ROS:  Pertinent items are noted in HPI.  PHYSICAL EXAMINATION:    BP 122/78 (BP Location: Right Arm, Patient Position: Sitting, Cuff Size: Large)   Pulse 84   Ht 5\' 5"  (1.651 m)   Wt 253 lb (114.8 kg)   LMP 12/19/2016 (Exact Date)   BMI 42.10 kg/m     General appearance: alert, cooperative and appears stated age   Abdomen: incisions intact, soft, non-tender, no masses,  no organomegaly   Pelvic: External genitalia:  no lesions              Urethra:  normal appearing urethra with no masses, tenderness or lesions              Bartholins and Skenes: normal                 Vagina: normal appearing vagina with normal color and discharge, no lesions              Cervix:  Absent.                 Bimanual Exam:  Uterus:   Absent.               Adnexa: no mass, fullness, tenderness              Rectal exam: Yes.  .  Confirms.              Anus:  normal sphincter tone, no lesions  Chaperone was present for exam.  ASSESSMENT  Status post laparoscopic hysterectomy with bilateral salpingectomy, cystoscopy.   PLAN  Ok to return to part time work, 3 hours per day starting 01/19/17.  She will return to unrestricted work on 01/30/17. I recommend she take her iron prescription for a total of 6 weeks post op. Return for 8 week post op visit.    An After Visit Summary was printed and given to the patient.

## 2017-01-24 ENCOUNTER — Ambulatory Visit: Payer: Self-pay | Admitting: Obstetrics and Gynecology

## 2017-01-29 ENCOUNTER — Ambulatory Visit: Payer: Self-pay | Admitting: Obstetrics and Gynecology

## 2017-02-12 ENCOUNTER — Encounter: Payer: Self-pay | Admitting: Obstetrics and Gynecology

## 2017-02-12 ENCOUNTER — Ambulatory Visit (INDEPENDENT_AMBULATORY_CARE_PROVIDER_SITE_OTHER): Payer: BLUE CROSS/BLUE SHIELD | Admitting: Obstetrics and Gynecology

## 2017-02-12 ENCOUNTER — Other Ambulatory Visit: Payer: Self-pay

## 2017-02-12 VITALS — BP 120/70 | HR 72 | Resp 16 | Wt 250.0 lb

## 2017-02-12 DIAGNOSIS — D500 Iron deficiency anemia secondary to blood loss (chronic): Secondary | ICD-10-CM

## 2017-02-12 DIAGNOSIS — Z9889 Other specified postprocedural states: Secondary | ICD-10-CM

## 2017-02-12 NOTE — Progress Notes (Signed)
GYNECOLOGY  VISIT   HPI: 46 y.o.   Married  Caucasian  female   G2P0002 with Patient's last menstrual period was 12/19/2016 (exact date).   here for 6 week post op visit.   Still on iron from Dr. Forde Dandy.   Feeling good overall.   GYNECOLOGIC HISTORY: Patient's last menstrual period was 12/19/2016 (exact date). Contraception:  Hysterectomy/Vasectomy Menopausal hormone therapy:  n/a Last mammogram:  03/24/16 - BIRADS 1 neg/density B Last pap smear:   09/27/16 - normal, HR HPV negative 11-27-14 Neg:Neg HR HPV        OB History    Gravida Para Term Preterm AB Living   2 2 0 0 0 2   SAB TAB Ectopic Multiple Live Births   0 0 0 0 0         Patient Active Problem List   Diagnosis Date Noted  . Status post total abdominal hysterectomy 12/19/2016  . Iron deficiency anemia 09/30/2016  . Insomnia 07/22/2016  . Auditory processing disorder 04/04/2016  . Encounter for routine gynecological examination 11/27/2014  . Morbid obesity ([place]) 12/13/2012  . Hyperglycemia 12/13/2012  . Other screening mammogram 12/12/2011  . Routine general medical examination at a health care facility 12/04/2011  . HYPERTRIGLYCERIDEMIA 04/07/2008  . PROBLEMS WITH HEARING 04/07/2008  . MAMMOGRAM, ABNORMAL 03/18/2008  . Anxiety disorder 02/07/2008  . ALLERGIC RHINITIS 02/07/2008  . HYPOGLYCEMIA, REACTIVE 11/15/2006    Past Medical History:  Diagnosis Date  . Anemia   . Anxiety   . Depression   . Diabetes mellitus without complication ([place])    type 2  . Dyspnea   . Hearing loss    wears hearing aids bilateral  . History of allergic rhinitis   . History of anxiety   . History of blood transfusion    [facility] 09/2016 2 units transfused  . History of depression   . History of hyperlipidemia   . History of syncope   . Seizures ([place])    hx seizures as a child,no seizures since age 74 yrs old  . Sleep apnea    uses mouth guard at night    Past Surgical History:  Procedure  Laterality Date  . [place]   x 2  . CYSTOSCOPY N/A 12/19/2016   Procedure: CYSTOSCOPY;  Surgeon: Nunzio Cobbs, MD;  Location: [facility];  Service: Gynecology;  Laterality: N/A;  . TOTAL LAPAROSCOPIC HYSTERECTOMY WITH SALPINGECTOMY Bilateral 12/19/2016   Procedure: HYSTERECTOMY TOTAL LAPAROSCOPIC WITH SALPINGECTOMY;  Surgeon: Nunzio Cobbs, MD;  Location: [facility];  Service: Gynecology;  Laterality: Bilateral;  . WISDOM TOOTH EXTRACTION      Current Outpatient Medications  Medication Sig Dispense Refill  . atorvastatin (LIPITOR) 20 MG tablet Take 1 tablet (20 mg total) by mouth daily. (Patient taking differently: Take 20 mg by mouth at bedtime. ) 90 tablet 3  . Choline Fenofibrate (FENOFIBRIC ACID) 135 MG CPDR Take 125 mg by mouth daily. (Patient taking differently: Take 135 mg by mouth at bedtime. ) 90 capsule 3  . escitalopram (LEXAPRO) 20 MG tablet Take 1 tablet (20 mg total) by mouth daily. 90 tablet 3  . FERREX 150 150 MG capsule Take 150 mg by mouth at bedtime.   6  . ibuprofen (ADVIL,MOTRIN) 600 MG tablet TAKE 1 TABLET (600 MG TOTAL) BY MOUTH EVERY 6 (SIX) HOURS AS NEEDED (MILD PAIN).  0  . miglitol (GLYSET) 25 MG tablet TAKE 1 TABLET (25 MG TOTAL) BY  MOUTH 2 (TWO) TIMES DAILY. 60 tablet 2   No current facility-administered medications for this visit.      ALLERGIES: Penicillins  Family History  Problem Relation Age of Onset  . Diabetes Father   . Diabetes Paternal Grandfather   . Prostate cancer Paternal Grandfather   . Diabetes Paternal Grandmother     Social History   Socioeconomic History  . Marital status: Married    Spouse name: Not on file  . Number of children: Not on file  . Years of education: Not on file  . Highest education level: Not on file  Social Needs  . Financial resource strain: Not on file  . Food insecurity - worry: Not on file  . Food insecurity - inability: Not on file  . Transportation needs - medical:  Not on file  . Transportation needs - non-medical: Not on file  Occupational History  . Not on file  Tobacco Use  . Smoking status: Never Smoker  . Smokeless tobacco: Never Used  Substance and Sexual Activity  . Alcohol use: Yes    Alcohol/week: 0.0 oz    Comment: occasional  winie  . Drug use: No  . Sexual activity: Yes    Birth control/protection: None    Comment: Husband - Vasectomy  Other Topics Concern  . Not on file  Social History Narrative  . Not on file    ROS:  Pertinent items are noted in HPI.  PHYSICAL EXAMINATION:    BP 120/70 (BP Location: Right Arm, Patient Position: Sitting, Cuff Size: Large)   Pulse 72   Resp 16   Wt 250 lb (113.4 kg)   LMP 12/19/2016 (Exact Date)   BMI 41.60 kg/m     General appearance: alert, cooperative and appears stated age   Abdomen: incisions intact.  Abdomen is soft, non-tender, no masses,  no organomegaly   Pelvic: External genitalia:  no lesions              Urethra:  normal appearing urethra with no masses, tenderness or lesions              Bartholins and Skenes: normal                 Vagina: normal appearing vagina with normal color and discharge, no lesions              Cervix:  Absent.  Suture line present and visible and palpable.                Bimanual Exam:  Uterus:   Absent.              Adnexa: no mass, fullness, tenderness                Chaperone was present for exam.  ASSESSMENT  Doing well post op.  Anemia.  PLAN  Will check CBC, iron, and ferritin.  Continue pelvic rest and no heavy exercise.  We talked about the risk of vaginal cuff dehiscence.  Will do a recheck in 2 weeks.    An After Visit Summary was printed and given to the patient.

## 2017-02-13 ENCOUNTER — Telehealth: Payer: Self-pay | Admitting: Obstetrics and Gynecology

## 2017-02-13 LAB — CBC
HEMATOCRIT: 37.1 % (ref 34.0–46.6)
HEMOGLOBIN: 12.1 g/dL (ref 11.1–15.9)
MCH: 26 pg — ABNORMAL LOW (ref 26.6–33.0)
MCHC: 32.6 g/dL (ref 31.5–35.7)
MCV: 80 fL (ref 79–97)
Platelets: 295 10*3/uL (ref 150–379)
RBC: 4.66 x10E6/uL (ref 3.77–5.28)
RDW: 15.9 % — ABNORMAL HIGH (ref 12.3–15.4)
WBC: 6 10*3/uL (ref 3.4–10.8)

## 2017-02-13 LAB — FERRITIN: Ferritin: 34 ng/mL (ref 15–150)

## 2017-02-13 LAB — IRON: Iron: 39 ug/dL (ref 27–159)

## 2017-02-13 NOTE — Telephone Encounter (Signed)
Left message for pt to reschedule [place] appt °

## 2017-02-13 NOTE — Telephone Encounter (Signed)
Please have patient schedule her post op visit in 2 weeks.

## 2017-02-13 NOTE — Telephone Encounter (Signed)
Left message to call Sharee Pimple at [phone].  Confirm PCP on file -need to fax copy of recent labs. See lab results dated 02/12/17.

## 2017-02-14 NOTE — Telephone Encounter (Signed)
Spoke with patient, confirmed copy of labs to go to Dr. Forde Dandy. F/u rescheduled with Dr. Quincy Simmonds for 03/01/17 at 2:30pm. Patient verbalizes understanding and is agreeable.   Routing to provider for final review. Patient is agreeable to disposition. Will close encounter.

## 2017-02-19 ENCOUNTER — Ambulatory Visit: Payer: Self-pay | Admitting: Obstetrics and Gynecology

## 2017-02-27 ENCOUNTER — Ambulatory Visit: Payer: Self-pay | Admitting: Obstetrics and Gynecology

## 2017-02-27 ENCOUNTER — Telehealth: Payer: Self-pay

## 2017-02-27 NOTE — Telephone Encounter (Signed)
Tried calling patient to reschedule 03/01/17 appointment, no answer. Message left for patient to return my call.

## 2017-02-28 NOTE — Telephone Encounter (Signed)
Spoke with patient and rescheduled her 8 week post appointment with Dr.Jertson on 03-07-17 at 4:15pm.

## 2017-03-01 ENCOUNTER — Ambulatory Visit: Payer: Self-pay | Admitting: Obstetrics and Gynecology

## 2017-03-07 ENCOUNTER — Ambulatory Visit (INDEPENDENT_AMBULATORY_CARE_PROVIDER_SITE_OTHER): Payer: BLUE CROSS/BLUE SHIELD | Admitting: Obstetrics and Gynecology

## 2017-03-07 ENCOUNTER — Encounter: Payer: Self-pay | Admitting: Obstetrics and Gynecology

## 2017-03-07 ENCOUNTER — Other Ambulatory Visit: Payer: Self-pay

## 2017-03-07 VITALS — BP 138/98 | HR 80 | Resp 16 | Wt 255.0 lb

## 2017-03-07 DIAGNOSIS — Z90710 Acquired absence of both cervix and uterus: Secondary | ICD-10-CM | POA: Diagnosis not present

## 2017-03-07 NOTE — Progress Notes (Signed)
GYNECOLOGY  VISIT   HPI: 46 y.o.   Married  Caucasian  female   G2P0002 with Patient's last menstrual period was 12/19/2016 (exact date).   here for follow up. Patient is 2.5 months s/p TLH. Here for f/u exam. Dr Quincy Simmonds wanted her to have another pelvic exam prior to being sexually active or exercising. She is feeling great, no c/o. No bowel or bladder c/o.      GYNECOLOGIC HISTORY: Patient's last menstrual period was 12/19/2016 (exact date). Contraception:hysterectomy  Menopausal hormone therapy: none         OB History    Gravida Para Term Preterm AB Living   2 2 0 0 0 2   SAB TAB Ectopic Multiple Live Births   0 0 0 0 0         Patient Active Problem List   Diagnosis Date Noted  . Status post total abdominal hysterectomy 12/19/2016  . Iron deficiency anemia 09/30/2016  . Insomnia 07/22/2016  . Auditory processing disorder 04/04/2016  . Encounter for routine gynecological examination 11/27/2014  . Morbid obesity ([place]) 12/13/2012  . Hyperglycemia 12/13/2012  . Other screening mammogram 12/12/2011  . Routine general medical examination at a health care facility 12/04/2011  . HYPERTRIGLYCERIDEMIA 04/07/2008  . PROBLEMS WITH HEARING 04/07/2008  . MAMMOGRAM, ABNORMAL 03/18/2008  . Anxiety disorder 02/07/2008  . ALLERGIC RHINITIS 02/07/2008  . HYPOGLYCEMIA, REACTIVE 11/15/2006    Past Medical History:  Diagnosis Date  . Anemia   . Anxiety   . Depression   . Diabetes mellitus without complication ([place])    type 2  . Dyspnea   . Hearing loss    wears hearing aids bilateral  . History of allergic rhinitis   . History of anxiety   . History of blood transfusion    [facility] 09/2016 2 units transfused  . History of depression   . History of hyperlipidemia   . History of syncope   . Seizures ([place])    hx seizures as a child,no seizures since age 52 yrs old  . Sleep apnea    uses mouth guard at night    Past Surgical History:  Procedure Laterality Date  . [place]   x 2  . CYSTOSCOPY N/A 12/19/2016   Procedure: CYSTOSCOPY;  Surgeon: Nunzio Cobbs, MD;  Location: [facility];  Service: Gynecology;  Laterality: N/A;  . TOTAL LAPAROSCOPIC HYSTERECTOMY WITH SALPINGECTOMY Bilateral 12/19/2016   Procedure: HYSTERECTOMY TOTAL LAPAROSCOPIC WITH SALPINGECTOMY;  Surgeon: Nunzio Cobbs, MD;  Location: [facility];  Service: Gynecology;  Laterality: Bilateral;  . WISDOM TOOTH EXTRACTION      Current Outpatient Medications  Medication Sig Dispense Refill  . atorvastatin (LIPITOR) 20 MG tablet Take 1 tablet (20 mg total) by mouth daily. (Patient taking differently: Take 20 mg by mouth at bedtime. ) 90 tablet 3  . Choline Fenofibrate (FENOFIBRIC ACID) 135 MG CPDR Take 125 mg by mouth daily. (Patient taking differently: Take 135 mg by mouth at bedtime. ) 90 capsule 3  . escitalopram (LEXAPRO) 20 MG tablet Take 1 tablet (20 mg total) by mouth daily. 90 tablet 3  . FERREX 150 150 MG capsule Take 150 mg by mouth at bedtime.   6  . miglitol (GLYSET) 25 MG tablet TAKE 1 TABLET (25 MG TOTAL) BY MOUTH 2 (TWO) TIMES DAILY. 60 tablet 2   No current facility-administered medications for this visit.      ALLERGIES: Penicillins  Family  History  Problem Relation Age of Onset  . Diabetes Father   . Diabetes Paternal Grandfather   . Prostate cancer Paternal Grandfather   . Diabetes Paternal Grandmother     Social History   Socioeconomic History  . Marital status: Married    Spouse name: Not on file  . Number of children: Not on file  . Years of education: Not on file  . Highest education level: Not on file  Social Needs  . Financial resource strain: Not on file  . Food insecurity - worry: Not on file  . Food insecurity - inability: Not on file  . Transportation needs - medical: Not on file  . Transportation needs - non-medical: Not on file  Occupational History  . Not on file  Tobacco Use  . Smoking status: Never Smoker  . Smokeless  tobacco: Never Used  Substance and Sexual Activity  . Alcohol use: Yes    Alcohol/week: 0.0 oz    Comment: occasional  winie  . Drug use: No  . Sexual activity: Yes    Birth control/protection: None    Comment: Husband - Vasectomy  Other Topics Concern  . Not on file  Social History Narrative  . Not on file    Review of Systems  Constitutional: Negative.   HENT: Negative.   Eyes: Negative.   Respiratory: Negative.   Cardiovascular: Negative.   Gastrointestinal: Negative.   Genitourinary: Negative.   Musculoskeletal: Negative.   Skin: Negative.   Neurological: Negative.   Endo/Heme/Allergies: Negative.   Psychiatric/Behavioral: Negative.     PHYSICAL EXAMINATION:    BP (!) 138/98 (BP Location: Right Arm, Patient Position: Sitting, Cuff Size: Large)   Pulse 80   Resp 16   Wt 255 lb (115.7 kg)   LMP 12/19/2016 (Exact Date)   BMI 42.43 kg/m     General appearance: alert, cooperative and appears stated age  Pelvic: External genitalia:  no lesions              Urethra:  normal appearing urethra with no masses, tenderness or lesions              Bartholins and Skenes: normal                 Vagina: normal appearing vagina with normal color and discharge, no lesions              Cervix: absent, cuff is healing well, small amount of suture is seen and palpated. WNL.              Bimanual Exam:  Uterus:  uterus absent              Adnexa: no mass, fullness, tenderness               Chaperone was present for exam.  ASSESSMENT 2.5 months s/p TLH, doing well, normal post op exam    PLAN She can come off of pelvic rest and increase activity as tolerated.    An After Visit Summary was printed and given to the patient.

## 2017-03-19 ENCOUNTER — Ambulatory Visit: Payer: Self-pay | Admitting: Obstetrics and Gynecology

## 2017-04-04 ENCOUNTER — Other Ambulatory Visit: Payer: Self-pay | Admitting: Family Medicine

## 2017-04-04 ENCOUNTER — Other Ambulatory Visit: Payer: Self-pay | Admitting: Obstetrics and Gynecology

## 2017-04-04 DIAGNOSIS — Z1231 Encounter for screening mammogram for malignant neoplasm of breast: Secondary | ICD-10-CM

## 2017-04-25 ENCOUNTER — Ambulatory Visit
Admission: RE | Admit: 2017-04-25 | Discharge: 2017-04-25 | Disposition: A | Discharge status: Home / Self Care | Payer: BLUE CROSS/BLUE SHIELD | Source: Ambulatory Visit | Attending: Obstetrics and Gynecology | Admitting: Obstetrics and Gynecology

## 2017-04-25 DIAGNOSIS — Z1231 Encounter for screening mammogram for malignant neoplasm of breast: Secondary | ICD-10-CM

## 2017-04-25 IMAGING — MG DIGITAL SCREENING BILATERAL MAMMOGRAM WITH TOMO AND CAD
8 of 12 series · 8 of 28 positions shown · non-contrast
Comparison: Previous exam(s).

CLINICAL DATA: Screening.

EXAM:
DIGITAL SCREENING BILATERAL MAMMOGRAM WITH TOMO AND CAD

[L MLO]
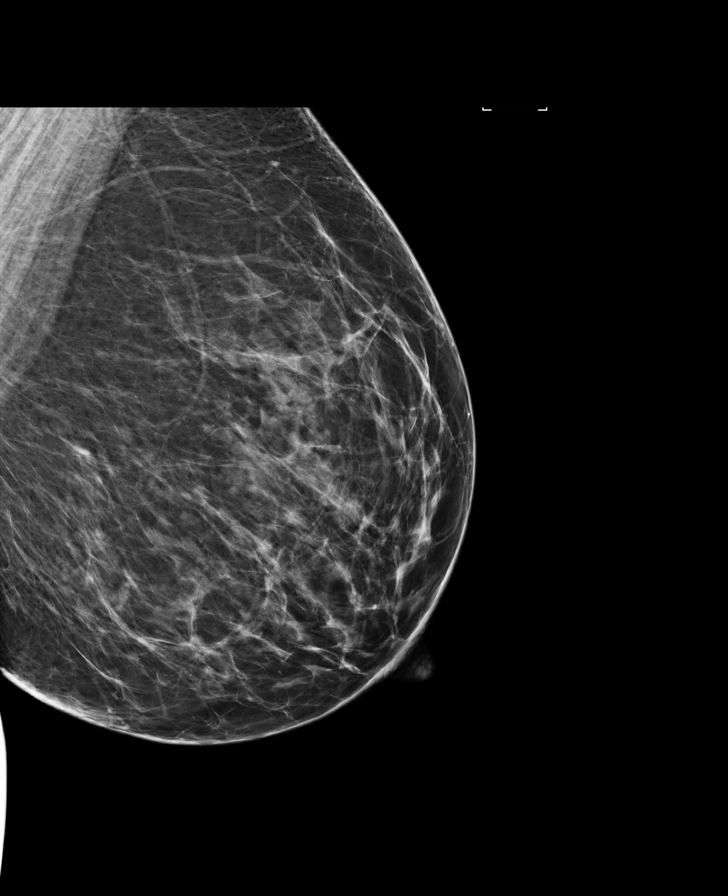

[L MLO synth-2D]
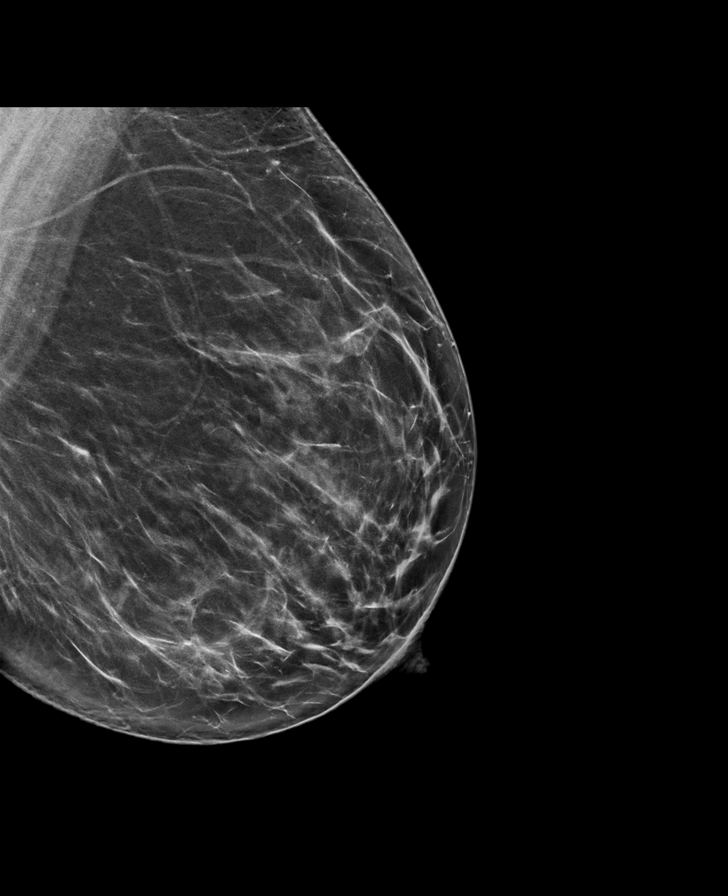

[R CC synth-2D]
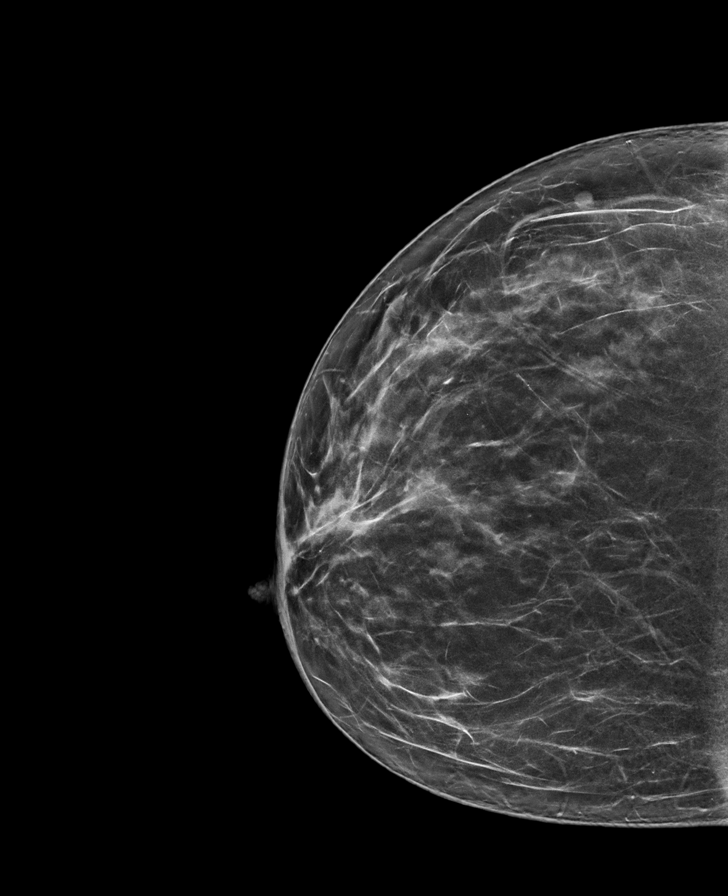

[R MLO]
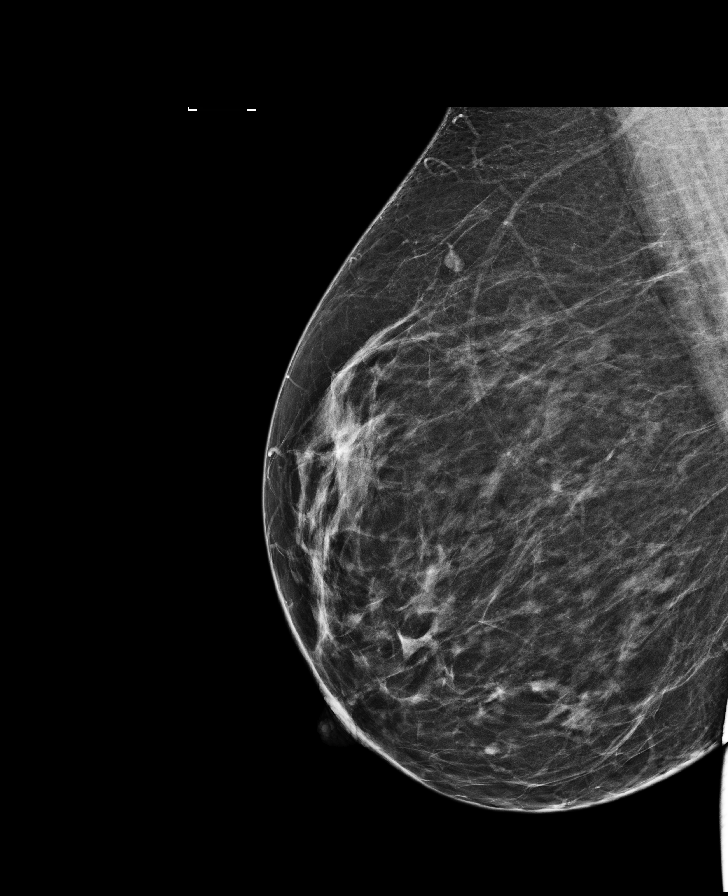

[L CC synth-2D]
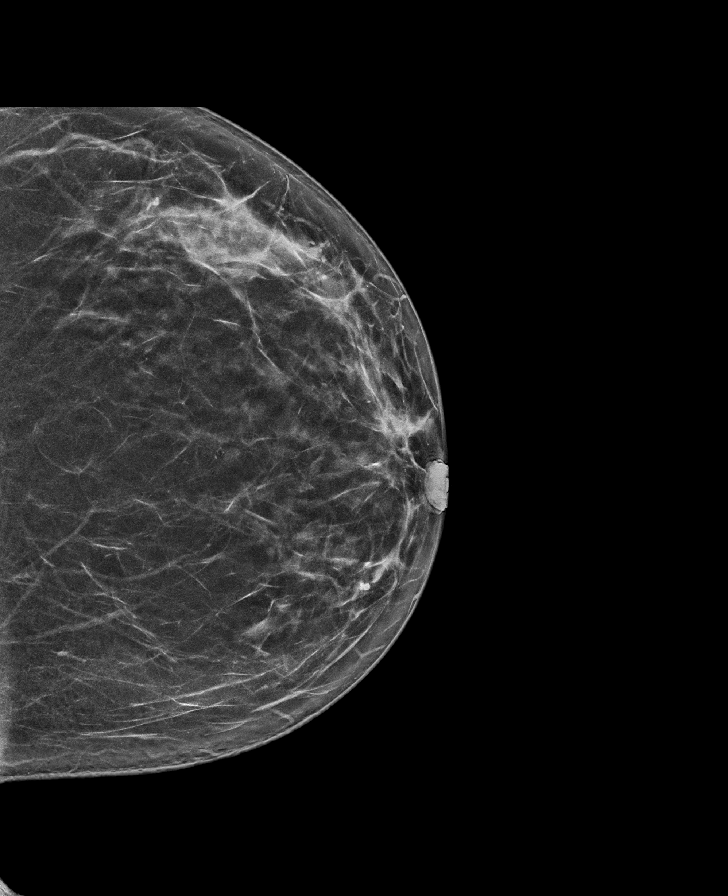

[R CC]
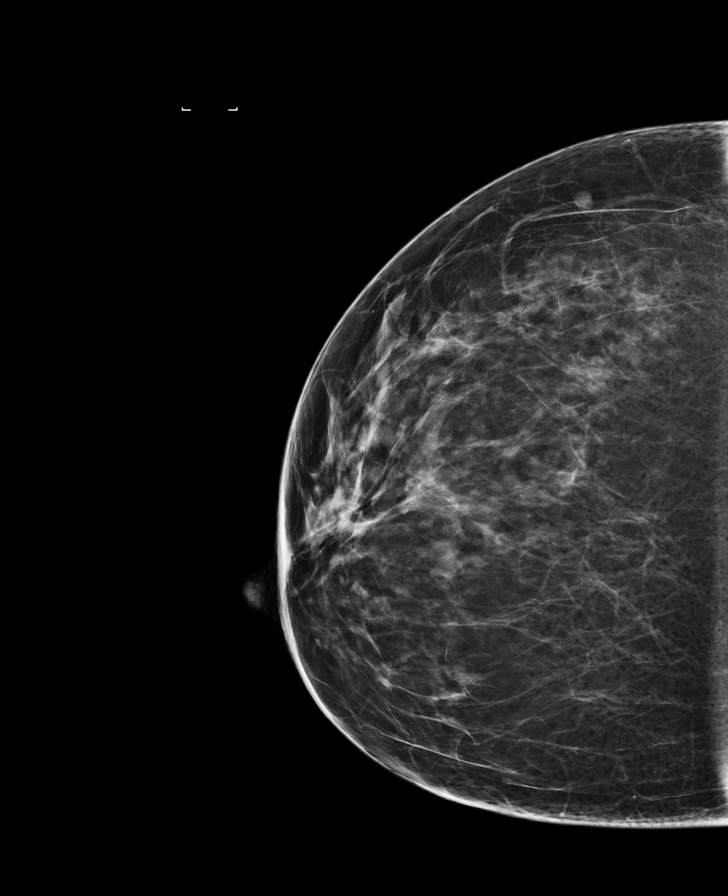

[R MLO synth-2D]
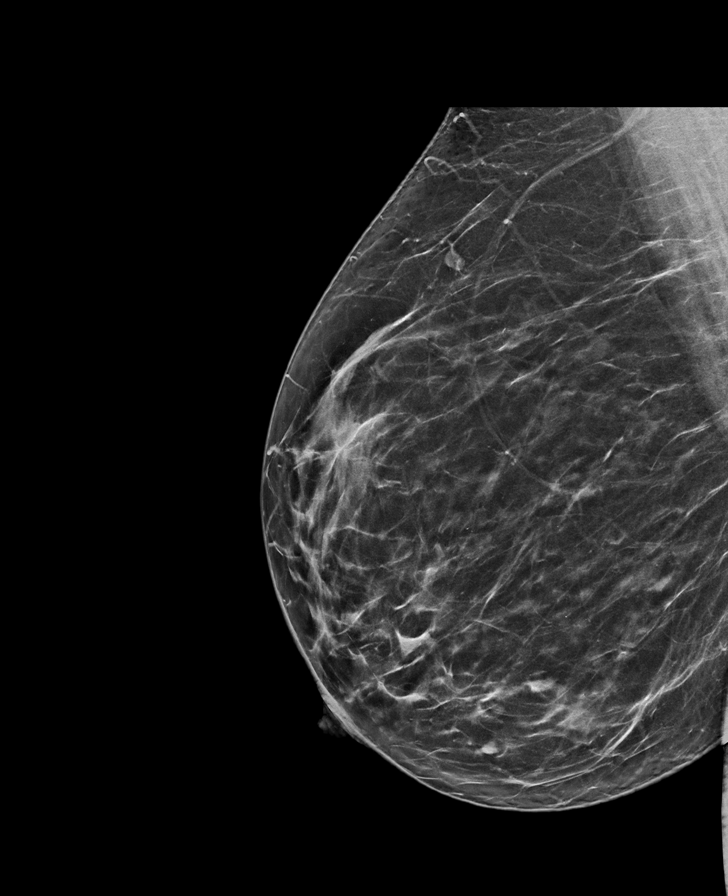

[L CC]
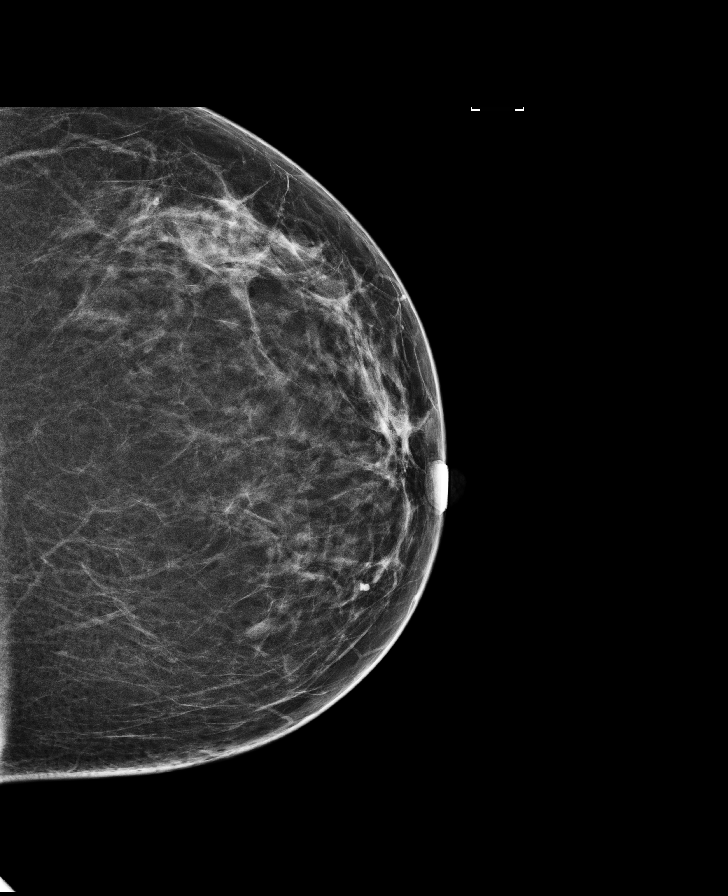

[8 of 28 positions shown; findings below may reference images not displayed]

ACR Breast Density Category b: There are scattered areas of
fibroglandular density.
FINDINGS: There are no findings suspicious for malignancy. Images were
processed with CAD.
IMPRESSION: No mammographic evidence of malignancy. A result letter of this
screening mammogram will be mailed directly to the patient.

RECOMMENDATION:
Screening mammogram in one year. (Code:[TQ])

BI-RADS CATEGORY  1: Negative.

## 2017-06-11 DIAGNOSIS — E7849 Other hyperlipidemia: Secondary | ICD-10-CM | POA: Diagnosis not present

## 2017-06-11 DIAGNOSIS — R7302 Impaired glucose tolerance (oral): Secondary | ICD-10-CM | POA: Diagnosis not present

## 2017-06-11 DIAGNOSIS — E668 Other obesity: Secondary | ICD-10-CM | POA: Diagnosis not present

## 2017-06-11 DIAGNOSIS — D649 Anemia, unspecified: Secondary | ICD-10-CM | POA: Diagnosis not present

## 2017-07-11 ENCOUNTER — Other Ambulatory Visit: Payer: Self-pay | Admitting: *Deleted

## 2017-07-11 NOTE — Telephone Encounter (Signed)
No recent or future appts., please advise  

## 2017-07-11 NOTE — Telephone Encounter (Signed)
Please schedule f/u and refill until then  

## 2017-07-12 MED ORDER — MIGLITOL 25 MG PO TABS: ORAL_TABLET | ORAL | 2 refills | Status: DC

## 2017-09-16 ENCOUNTER — Telehealth: Payer: Self-pay | Admitting: Family Medicine

## 2017-09-16 DIAGNOSIS — D509 Iron deficiency anemia, unspecified: Secondary | ICD-10-CM

## 2017-09-16 DIAGNOSIS — E781 Pure hyperglyceridemia: Secondary | ICD-10-CM

## 2017-09-16 DIAGNOSIS — Z0000 Encounter for general adult medical examination without abnormal findings: Secondary | ICD-10-CM

## 2017-09-16 DIAGNOSIS — R739 Hyperglycemia, unspecified: Secondary | ICD-10-CM

## 2017-09-16 NOTE — Telephone Encounter (Signed)
-----   Message from Ellamae Sia sent at 09/12/2017 10:28 AM EDT ----- Regarding: Lab orders for Monday, 7.8.19 Patient is scheduled for CPX labs, please order future labs, Thanks , Karna Christmas

## 2017-09-24 ENCOUNTER — Other Ambulatory Visit: Payer: BLUE CROSS/BLUE SHIELD

## 2017-09-25 ENCOUNTER — Other Ambulatory Visit (INDEPENDENT_AMBULATORY_CARE_PROVIDER_SITE_OTHER): Payer: BLUE CROSS/BLUE SHIELD

## 2017-09-25 DIAGNOSIS — D509 Iron deficiency anemia, unspecified: Secondary | ICD-10-CM | POA: Diagnosis not present

## 2017-09-25 DIAGNOSIS — R739 Hyperglycemia, unspecified: Secondary | ICD-10-CM

## 2017-09-25 DIAGNOSIS — E781 Pure hyperglyceridemia: Secondary | ICD-10-CM

## 2017-09-25 DIAGNOSIS — Z0000 Encounter for general adult medical examination without abnormal findings: Secondary | ICD-10-CM

## 2017-09-25 LAB — CBC WITH DIFFERENTIAL/PLATELET
BASOS PCT: 0.7 % (ref 0.0–3.0)
Basophils Absolute: 0 10*3/uL (ref 0.0–0.1)
EOS ABS: 0.1 10*3/uL (ref 0.0–0.7)
EOS PCT: 2.9 % (ref 0.0–5.0)
HCT: 39.7 % (ref 36.0–46.0)
Hemoglobin: 13.6 g/dL (ref 12.0–15.0)
LYMPHS ABS: 1.9 10*3/uL (ref 0.7–4.0)
Lymphocytes Relative: 37.2 % (ref 12.0–46.0)
MCHC: 34.2 g/dL (ref 30.0–36.0)
MCV: 85.4 fl (ref 78.0–100.0)
Monocytes Absolute: 0.3 10*3/uL (ref 0.1–1.0)
Monocytes Relative: 4.9 % (ref 3.0–12.0)
NEUTROS PCT: 54.3 % (ref 43.0–77.0)
Neutro Abs: 2.8 10*3/uL (ref 1.4–7.7)
PLATELETS: 263 10*3/uL (ref 150.0–400.0)
RBC: 4.65 Mil/uL (ref 3.87–5.11)
RDW: 14 % (ref 11.5–15.5)
WBC: 5.2 10*3/uL (ref 4.0–10.5)

## 2017-09-25 LAB — COMPREHENSIVE METABOLIC PANEL
ALBUMIN: 4.2 g/dL (ref 3.5–5.2)
ALT: 20 U/L (ref 0–35)
AST: 17 U/L (ref 0–37)
Alkaline Phosphatase: 39 U/L (ref 39–117)
BILIRUBIN TOTAL: 0.4 mg/dL (ref 0.2–1.2)
BUN: 10 mg/dL (ref 6–23)
CALCIUM: 8.9 mg/dL (ref 8.4–10.5)
CO2: 28 meq/L (ref 19–32)
Chloride: 103 mEq/L (ref 96–112)
Creatinine, Ser: 0.74 mg/dL (ref 0.40–1.20)
GFR: 89.86 mL/min (ref >60.00)
Glucose, Bld: 112 mg/dL — ABNORMAL HIGH (ref 70–99)
Potassium: 3.9 mEq/L (ref 3.5–5.1)
Sodium: 139 mEq/L (ref 135–145)
Total Protein: 7 g/dL (ref 6.0–8.3)

## 2017-09-25 LAB — LIPID PANEL
Cholesterol: 196 mg/dL (ref 0–200)
HDL: 48.4 mg/dL (ref >39.00)
LDL Cholesterol: 110 mg/dL — ABNORMAL HIGH (ref 0–99)
NONHDL: 147.38
TRIGLYCERIDES: 186 mg/dL — AB (ref 0.0–149.0)
Total CHOL/HDL Ratio: 4
VLDL: 37.2 mg/dL (ref 0.0–40.0)

## 2017-09-25 LAB — TSH: TSH: 1.36 u[IU]/mL (ref 0.35–4.50)

## 2017-09-25 LAB — HEMOGLOBIN A1C: Hgb A1c MFr Bld: 6.4 % (ref 4.6–6.5)

## 2017-09-25 NOTE — Progress Notes (Signed)
l °

## 2017-09-26 ENCOUNTER — Encounter: Payer: Self-pay | Admitting: Family Medicine

## 2017-09-26 ENCOUNTER — Ambulatory Visit (INDEPENDENT_AMBULATORY_CARE_PROVIDER_SITE_OTHER): Payer: BLUE CROSS/BLUE SHIELD | Admitting: Family Medicine

## 2017-09-26 VITALS — BP 122/80 | HR 77 | Temp 99.9°F | Ht 66.0 in | Wt 257.5 lb

## 2017-09-26 DIAGNOSIS — Z0000 Encounter for general adult medical examination without abnormal findings: Secondary | ICD-10-CM

## 2017-09-26 DIAGNOSIS — E282 Polycystic ovarian syndrome: Secondary | ICD-10-CM | POA: Diagnosis not present

## 2017-09-26 DIAGNOSIS — E781 Pure hyperglyceridemia: Secondary | ICD-10-CM | POA: Diagnosis not present

## 2017-09-26 DIAGNOSIS — F411 Generalized anxiety disorder: Secondary | ICD-10-CM | POA: Diagnosis not present

## 2017-09-26 DIAGNOSIS — R7303 Prediabetes: Secondary | ICD-10-CM | POA: Diagnosis not present

## 2017-09-26 MED ORDER — SPIRONOLACTONE 25 MG PO TABS: 25.0000 mg | ORAL_TABLET | Freq: Every day | ORAL | 3 refills | Status: DC

## 2017-09-26 MED ORDER — FENOFIBRIC ACID 135 MG PO CPDR: 135.0000 mg | DELAYED_RELEASE_CAPSULE | Freq: Every day | ORAL | 3 refills | Status: DC

## 2017-09-26 MED ORDER — ATORVASTATIN CALCIUM 20 MG PO TABS: 20.0000 mg | ORAL_TABLET | Freq: Every day | ORAL | 3 refills | Status: DC

## 2017-09-26 NOTE — Progress Notes (Signed)
Subjective:    Patient ID: Gail [name], female    DOB: [birth year], 47 y.o.   MRN: [id number]  HPI Here for health maintenance exam and to review chronic medical problems   Has been feeling good overall  Had a good year a school    Wt Readings from Last 3 Encounters:  09/26/17 257 lb 8 oz (116.8 kg)  03/07/17 255 lb (115.7 kg)  02/12/17 250 lb (113.4 kg)  exercise - walking / on and off  Still has to eat every 3 hours due to blood sugar - fruit and protein  41.56 kg/m   Mammogram 2/19-neg  Self breast exam - no lumps   Pap 7/18 -negative/also neg HPV with Dr Quincy Simmonds  Had a total hysterectomy in 10/18  No gyn issues  Will f/u with gyn in October   Tetanus shot 9/13 Declines flu shots   Blood pressure  BP Readings from Last 3 Encounters:  09/26/17 134/90  03/07/17 (!) 138/98  02/12/17 120/70   Pulse  Pulse Readings from Last 3 Encounters:  09/26/17 77  03/07/17 80  02/12/17 72    Hx of hyper and hypoglycemia  Has seen endocrinology  Lab Results  Component Value Date   HGBA1C 6.4 09/25/2017  still sees Dr Forde Dandy for blood sugar issues  glyset     Hx of high triglycerides Lab Results  Component Value Date   CHOL 196 09/25/2017   CHOL 135 07/21/2016   CHOL 152 01/18/2015   Lab Results  Component Value Date   HDL 48.40 09/25/2017   HDL 47 (L) 07/21/2016   HDL 51.60 01/18/2015   Lab Results  Component Value Date   LDLCALC 110 (H) 09/25/2017   LDLCALC 68 07/21/2016   LDLCALC 77 01/18/2015   Lab Results  Component Value Date   TRIG 186.0 (H) 09/25/2017   TRIG 101 07/21/2016   TRIG 115.0 01/18/2015   Lab Results  Component Value Date   CHOLHDL 4 09/25/2017   CHOLHDL 2.9 07/21/2016   CHOLHDL 3 01/18/2015   Lab Results  Component Value Date   LDLDIRECT 106.4 12/05/2012   ran out of her lipitor 2 mo ago-needs a px  fenofibric acid 135 mg daily -ran out of also   Does avoid trans/ sat fats   Mood  lexapro-no longer needs it/stopped it  and doing fine  Was hoping to loose weight off of it    Other labs: Results for orders placed or performed in visit on 09/25/17  TSH  Result Value Ref Range   TSH 1.36 0.35 - 4.50 uIU/mL  Lipid panel  Result Value Ref Range   Cholesterol 196 0 - 200 mg/dL   Triglycerides 186.0 (H) 0.0 - 149.0 mg/dL   HDL 48.40 >39.00 mg/dL   VLDL 37.2 0.0 - 40.0 mg/dL   LDL Cholesterol 110 (H) 0 - 99 mg/dL   Total CHOL/HDL Ratio 4    NonHDL 147.38   Hemoglobin A1c  Result Value Ref Range   Hgb A1c MFr Bld 6.4 4.6 - 6.5 %  Comprehensive metabolic panel  Result Value Ref Range   Sodium 139 135 - 145 mEq/L   Potassium 3.9 3.5 - 5.1 mEq/L   Chloride 103 96 - 112 mEq/L   CO2 28 19 - 32 mEq/L   Glucose, Bld 112 (H) 70 - 99 mg/dL   BUN 10 6 - 23 mg/dL   Creatinine, Ser 0.74 0.40 - 1.20 mg/dL   Total Bilirubin 0.4 0.2 -  1.2 mg/dL   Alkaline Phosphatase 39 39 - 117 U/L   AST 17 0 - 37 U/L   ALT 20 0 - 35 U/L   Total Protein 7.0 6.0 - 8.3 g/dL   Albumin 4.2 3.5 - 5.2 g/dL   Calcium 8.9 8.4 - 10.5 mg/dL   GFR 89.86 >60.00 mL/min  CBC with Differential/Platelet  Result Value Ref Range   WBC 5.2 4.0 - 10.5 K/uL   RBC 4.65 3.87 - 5.11 Mil/uL   Hemoglobin 13.6 12.0 - 15.0 g/dL   HCT 39.7 36.0 - 46.0 %   MCV 85.4 78.0 - 100.0 fl   MCHC 34.2 30.0 - 36.0 g/dL   RDW 14.0 11.5 - 15.5 %   Platelets 263.0 150.0 - 400.0 K/uL   Neutrophils Relative % 54.3 43.0 - 77.0 %   Lymphocytes Relative 37.2 12.0 - 46.0 %   Monocytes Relative 4.9 3.0 - 12.0 %   Eosinophils Relative 2.9 0.0 - 5.0 %   Basophils Relative 0.7 0.0 - 3.0 %   Neutro Abs 2.8 1.4 - 7.7 K/uL   Lymphs Abs 1.9 0.7 - 4.0 K/uL   Monocytes Absolute 0.3 0.1 - 1.0 K/uL   Eosinophils Absolute 0.1 0.0 - 0.7 K/uL   Basophils Absolute 0.0 0.0 - 0.1 K/uL     Patient Active Problem List   Diagnosis Date Noted  . PCOS (polycystic ovarian syndrome) 09/26/2017  . Status post total abdominal hysterectomy 12/19/2016  . Insomnia 07/22/2016  .  Auditory processing disorder 04/04/2016  . Encounter for routine gynecological examination 11/27/2014  . Morbid obesity ([place]) 12/13/2012  . Prediabetes 12/13/2012  . Other screening mammogram 12/12/2011  . Routine general medical examination at a health care facility 12/04/2011  . HYPERTRIGLYCERIDEMIA 04/07/2008  . PROBLEMS WITH HEARING 04/07/2008  . MAMMOGRAM, ABNORMAL 03/18/2008  . Anxiety disorder 02/07/2008  . ALLERGIC RHINITIS 02/07/2008  . HYPOGLYCEMIA, REACTIVE 11/15/2006   Past Medical History:  Diagnosis Date  . Anemia   . Anxiety   . Depression   . Diabetes mellitus without complication ([place])    type 2  . Dyspnea   . Hearing loss    wears hearing aids bilateral  . History of allergic rhinitis   . History of anxiety   . History of blood transfusion    [facility] 09/2016 2 units transfused  . History of depression   . History of hyperlipidemia   . History of syncope   . Seizures ([place])    hx seizures as a child,no seizures since age 23 yrs old  . Sleep apnea    uses mouth guard at night   Past Surgical History:  Procedure Laterality Date  . [place]   x 2  . CYSTOSCOPY N/A 12/19/2016   Procedure: CYSTOSCOPY;  Surgeon: Nunzio Cobbs, MD;  Location: [facility];  Service: Gynecology;  Laterality: N/A;  . TOTAL LAPAROSCOPIC HYSTERECTOMY WITH SALPINGECTOMY Bilateral 12/19/2016   Procedure: HYSTERECTOMY TOTAL LAPAROSCOPIC WITH SALPINGECTOMY;  Surgeon: Nunzio Cobbs, MD;  Location: [facility];  Service: Gynecology;  Laterality: Bilateral;  . WISDOM TOOTH EXTRACTION     Social History   Tobacco Use  . Smoking status: Never Smoker  . Smokeless tobacco: Never Used  Substance Use Topics  . Alcohol use: Yes    Alcohol/week: 0.0 oz    Comment: occasional  winie  . Drug use: No   Family History  Problem Relation Age of Onset  . Diabetes Father   .  Diabetes Paternal Grandfather   . Prostate cancer Paternal Grandfather   . Diabetes Paternal  Grandmother    Allergies  Allergen Reactions  . Penicillins Hives    Has patient had a PCN reaction causing immediate rash, facial/tongue/throat swelling, SOB or lightheadedness with hypotension: Yes Has patient had a PCN reaction causing severe rash involving mucus membranes or skin necrosis: No Has patient had a PCN reaction that required hospitalization: No Has patient had a PCN reaction occurring within the last 10 years: No If all of the above answers are "NO", then may proceed with Cephalosporin use.    Current Outpatient Medications on File Prior to Visit  Medication Sig Dispense Refill  . miglitol (GLYSET) 25 MG tablet TAKE 1 TABLET (25 MG TOTAL) BY MOUTH 2 (TWO) TIMES DAILY. 60 tablet 2   No current facility-administered medications on file prior to visit.     Review of Systems  Constitutional: Positive for fatigue and unexpected weight change. Negative for activity change, appetite change and fever.  HENT: Positive for hearing loss. Negative for congestion, ear pain, rhinorrhea, sinus pressure and sore throat.   Eyes: Negative for pain, redness and visual disturbance.  Respiratory: Negative for cough, shortness of breath and wheezing.   Cardiovascular: Negative for chest pain and palpitations.  Gastrointestinal: Negative for abdominal pain, blood in stool, constipation and diarrhea.  Endocrine: Negative for polydipsia and polyuria.  Genitourinary: Negative for dysuria, frequency and urgency.  Musculoskeletal: Negative for arthralgias, back pain and myalgias.  Skin: Negative for pallor and rash.  Allergic/Immunologic: Negative for environmental allergies.  Neurological: Negative for dizziness, syncope and headaches.  Hematological: Negative for adenopathy. Does not bruise/bleed easily.  Psychiatric/Behavioral: Negative for decreased concentration and dysphoric mood. The patient is nervous/anxious.        Anxiety is fairly well controlled        Objective:   Physical  Exam  Constitutional: She appears well-developed and well-nourished. No distress.  obese and well appearing   HENT:  Head: Normocephalic and atraumatic.  Right Ear: External ear normal.  Left Ear: External ear normal.  Mouth/Throat: Oropharynx is clear and moist.  Eyes: Pupils are equal, round, and reactive to light. Conjunctivae and EOM are normal. No scleral icterus.  Neck: Normal range of motion. Neck supple. No JVD present. Carotid bruit is not present. No thyromegaly present.  Cardiovascular: Normal rate, regular rhythm, normal heart sounds and intact distal pulses. Exam reveals no gallop.  Pulmonary/Chest: Effort normal and breath sounds normal. No respiratory distress. She has no wheezes. She exhibits no tenderness. No breast tenderness, discharge or bleeding.  Abdominal: Soft. Bowel sounds are normal. She exhibits no distension, no abdominal bruit and no mass. There is no tenderness.  Genitourinary: No breast tenderness, discharge or bleeding.  Genitourinary Comments: Breast exam: No mass, nodules, thickening, tenderness, bulging, retraction, inflamation, nipple discharge or skin changes noted.  No axillary or clavicular LA.      Musculoskeletal: Normal range of motion. She exhibits no edema or tenderness.  Lymphadenopathy:    She has no cervical adenopathy.  Neurological: She is alert. She has normal reflexes. No cranial nerve deficit. She exhibits normal muscle tone. Coordination normal.  Skin: Skin is warm and dry. Capillary refill takes less than 2 seconds. No rash noted. No erythema. No pallor.  Tanned with lentigines   Psychiatric: She has a normal mood and affect.          Assessment & Plan:   Problem List Items Addressed This Visit  Endocrine   PCOS (polycystic ovarian syndrome)    With insulin insens and obesity  Trial of aldactone for hair growth with f/u for lab/ bp       Relevant Orders   Basic metabolic panel     Other   Anxiety disorder    No  longer needs lexapro-doing well without it       HYPERTRIGLYCERIDEMIA    On statin and fibrate but ran out  Trig 186  Will get back on medicines Disc goals for lipids and reasons to control them Rev last labs with pt Rev low sat fat diet in detail       Relevant Medications   atorvastatin (LIPITOR) 20 MG tablet   Choline Fenofibrate (FENOFIBRIC ACID) 135 MG CPDR   spironolactone (ALDACTONE) 25 MG tablet   Morbid obesity (HCC)    Discussed how this problem influences overall health and the risks it imposes  Reviewed plan for weight loss with lower calorie diet (via better food choices and also portion control or program like weight watchers) and exercise building up to or more than 30 minutes 5 days per week including some aerobic activity         Prediabetes    Lab Results  Component Value Date   HGBA1C 6.4 09/25/2017   disc imp of low glycemic diet and wt loss to prevent DM2       Routine general medical examination at a health care facility - Primary    Reviewed health habits including diet and exercise and skin cancer prevention Reviewed appropriate screening tests for age  Also reviewed health mt list, fam hx and immunization status , as well as social and family history   See HPI Labs rev  Doing well s.p hysterectomy  F/u with endocrinology as planned

## 2017-09-26 NOTE — Patient Instructions (Addendum)
Try the spironolactone for hair growth related to PCOS  If any side effects stop it and let me know  We need labs in 2 weeks   Keep working on diet and exercise  AIc is up a bit

## 2017-09-29 NOTE — Assessment & Plan Note (Signed)
Reviewed health habits including diet and exercise and skin cancer prevention Reviewed appropriate screening tests for age  Also reviewed health mt list, fam hx and immunization status , as well as social and family history   See HPI Labs rev  Doing well s.p hysterectomy  F/u with endocrinology as planned

## 2017-09-29 NOTE — Assessment & Plan Note (Signed)
On statin and fibrate but ran out  Trig 186  Will get back on medicines Disc goals for lipids and reasons to control them Rev last labs with pt Rev low sat fat diet in detail

## 2017-09-29 NOTE — Assessment & Plan Note (Signed)
Lab Results  Component Value Date   HGBA1C 6.4 09/25/2017   disc imp of low glycemic diet and wt loss to prevent DM2

## 2017-09-29 NOTE — Assessment & Plan Note (Signed)
No longer needs lexapro-doing well without it

## 2017-09-29 NOTE — Assessment & Plan Note (Signed)
With insulin insens and obesity  Trial of aldactone for hair growth with f/u for lab/ bp

## 2017-09-29 NOTE — Assessment & Plan Note (Signed)
Discussed how this problem influences overall health and the risks it imposes  Reviewed plan for weight loss with lower calorie diet (via better food choices and also portion control or program like weight watchers) and exercise building up to or more than 30 minutes 5 days per week including some aerobic activity    

## 2017-10-18 ENCOUNTER — Ambulatory Visit: Payer: BLUE CROSS/BLUE SHIELD | Admitting: Obstetrics and Gynecology

## 2017-10-30 DIAGNOSIS — H2513 Age-related nuclear cataract, bilateral: Secondary | ICD-10-CM | POA: Diagnosis not present

## 2017-10-30 DIAGNOSIS — H02401 Unspecified ptosis of right eyelid: Secondary | ICD-10-CM | POA: Diagnosis not present

## 2017-10-30 DIAGNOSIS — H04123 Dry eye syndrome of bilateral lacrimal glands: Secondary | ICD-10-CM | POA: Diagnosis not present

## 2017-11-01 NOTE — Progress Notes (Signed)
47 y.o. [id number] Married Caucasian female here for annual exam.    PCP asked patient if she has PCOS.  Plucking chin hair.  Not shaving.  Received rx for spironolactone but has not started taking it yet.   Tearing up more.  Off Lexapro but wants to restart.  Daughter leaving home.  Denies suicidal ideation.   Labs 09/25/17.  HgbA1C 6.4. TG 186, LDL cholesterol 110.  Hgb 13.6   PCP: Dr. Alba Cory    Endocrinology:  Dr. Forde Dandy.   Patient's last menstrual period was 12/19/2016 (exact date).           Sexually active: Yes.    The current method of family planning is status post hysterectomy.    Exercising: Yes.    walking and running Smoker:  no  Health Maintenance: Pap:  09/27/16 - normal, HR HPV negative 11-27-14 Neg:Neg HR HPV History of abnormal Pap:  no MMG:  04/25/17 BIRADS 1 negative/density b TDaP:  2013 Gardasil:   no HIV and Hep C: 07/26/15 Negative Screening Labs:  PCP   reports that she has never smoked. She has never used smokeless tobacco. She reports that she drinks alcohol. She reports that she does not use drugs.  Past Medical History:  Diagnosis Date  . Anemia   . Anxiety   . Depression   . Diabetes mellitus without complication ([place])    type 2  . Dyspnea   . Hearing loss    wears hearing aids bilateral  . History of allergic rhinitis   . History of anxiety   . History of blood transfusion    [facility] 09/2016 2 units transfused  . History of depression   . History of hyperlipidemia   . History of syncope   . Seizures ([place])    hx seizures as a child,no seizures since age 19 yrs old  . Sleep apnea    uses mouth guard at night    Past Surgical History:  Procedure Laterality Date  . [place]   x 2  . CYSTOSCOPY N/A 12/19/2016   Procedure: CYSTOSCOPY;  Surgeon: Nunzio Cobbs, MD;  Location: [facility];  Service: Gynecology;  Laterality: N/A;  . TOTAL LAPAROSCOPIC HYSTERECTOMY WITH SALPINGECTOMY Bilateral  12/19/2016   Procedure: HYSTERECTOMY TOTAL LAPAROSCOPIC WITH SALPINGECTOMY;  Surgeon: Nunzio Cobbs, MD;  Location: [facility];  Service: Gynecology;  Laterality: Bilateral;  . WISDOM TOOTH EXTRACTION      Current Outpatient Medications  Medication Sig Dispense Refill  . atorvastatin (LIPITOR) 20 MG tablet Take 1 tablet (20 mg total) by mouth at bedtime. 90 tablet 3  . Choline Fenofibrate (FENOFIBRIC ACID) 135 MG CPDR Take 135 mg by mouth at bedtime. 90 capsule 3  . miglitol (GLYSET) 25 MG tablet TAKE 1 TABLET (25 MG TOTAL) BY MOUTH 2 (TWO) TIMES DAILY. 60 tablet 2  . spironolactone (ALDACTONE) 25 MG tablet Take 1 tablet (25 mg total) by mouth daily. (Patient not taking: Reported on 11/02/2017) 90 tablet 3   No current facility-administered medications for this visit.     Family History  Problem Relation Age of Onset  . Diabetes Father   . Diabetes Paternal Grandfather   . Prostate cancer Paternal Grandfather   . Diabetes Paternal Grandmother     Review of Systems  All other systems reviewed and are negative.   Exam:   BP 122/80 (BP Location: Right Arm, Patient Position: Sitting, Cuff Size: Large)   Pulse 84  Resp 16   Ht 5' 6.5" (1.689 m)   Wt 258 lb (117 kg)   LMP 12/19/2016 (Exact Date)   BMI 41.02 kg/m     General appearance: alert, cooperative and appears stated age Head: Normocephalic, without obvious abnormality, atraumatic Neck: no adenopathy, supple, symmetrical, trachea midline and thyroid normal to inspection and palpation Lungs: clear to auscultation bilaterally Breasts: normal appearance, no masses or tenderness, No nipple retraction or dimpling, No nipple discharge or bleeding, No axillary or supraclavicular adenopathy Heart: regular rate and rhythm Abdomen: soft, non-tender; no masses, no organomegaly Extremities: extremities normal, atraumatic, no cyanosis or edema Skin: Skin color, texture, turgor normal. No rashes or lesions Lymph nodes: Cervical,  supraclavicular, and axillary nodes normal. No abnormal inguinal nodes palpated Neurologic: Grossly normal  Pelvic: External genitalia:  no lesions              Urethra:  normal appearing urethra with no masses, tenderness or lesions              Bartholins and Skenes: normal                 Vagina: normal appearing vagina with normal color and discharge, no lesions              Cervix:  absent              Pap taken: No. Bimanual Exam:  Uterus:   absent              Adnexa: no mass, fullness, tenderness              Rectal exam: Yes.  .  Confirms.              Anus:  normal sphincter tone, no lesions  Chaperone was present for exam.  Assessment:   Well woman visit with normal exam. Status post laparoscopic hysterectomy/bilateral salpingectomy for adenomyosis and fibroids.  Situational stress.   Plan: Mammogram screening. Recommended self breast awareness. Pap and HR HPV as above. Guidelines for Calcium, Vitamin D, regular exercise program including cardiovascular and weight bearing exercise. IFOB.  Restart Lexapro 10 mg daily.  She will let me know if she feels she needs to increase back up to 20 mg.  We discussed [facility] program.  She will contact them.  Follow up annually and prn.    After visit summary provided.

## 2017-11-02 ENCOUNTER — Encounter: Payer: Self-pay | Admitting: Obstetrics and Gynecology

## 2017-11-02 ENCOUNTER — Other Ambulatory Visit: Payer: Self-pay

## 2017-11-02 ENCOUNTER — Ambulatory Visit: Payer: BLUE CROSS/BLUE SHIELD | Admitting: Obstetrics and Gynecology

## 2017-11-02 VITALS — BP 122/80 | HR 84 | Resp 16 | Ht 66.5 in | Wt 258.0 lb

## 2017-11-02 DIAGNOSIS — Z01419 Encounter for gynecological examination (general) (routine) without abnormal findings: Secondary | ICD-10-CM

## 2017-11-02 DIAGNOSIS — Z1211 Encounter for screening for malignant neoplasm of colon: Secondary | ICD-10-CM

## 2017-11-02 MED ORDER — ESCITALOPRAM OXALATE 10 MG PO TABS: 10.0000 mg | ORAL_TABLET | Freq: Every day | ORAL | 11 refills | Status: DC

## 2017-11-02 NOTE — Patient Instructions (Addendum)
Polycystic Ovarian Syndrome Polycystic ovarian syndrome (PCOS) is a common hormonal disorder among women of reproductive age. In most women with PCOS, many small fluid-filled sacs (cysts) grow on the ovaries, and the cysts are not part of a normal menstrual cycle. PCOS can cause problems with your menstrual periods and make it difficult to get pregnant. It can also cause an increased risk of miscarriage with pregnancy. If it is not treated, PCOS can lead to serious health problems, such as diabetes and heart disease. What are the causes? The cause of PCOS is not known, but it may be the result of a combination of certain factors, such as:  Irregular menstrual cycle.  High levels of certain hormones (androgens).  Problems with the hormone that helps to control blood sugar (insulin resistance).  Certain genes.  What increases the risk? This condition is more likely to develop in women who have a family history of PCOS. What are the signs or symptoms? Symptoms of PCOS may include:  Multiple ovarian cysts.  Infrequent periods or no periods.  Periods that are too frequent or too heavy.  Unpredictable periods.  Inability to get pregnant (infertility) because of not ovulating.  Increased growth of hair on the face, chest, stomach, back, thumbs, thighs, or toes.  Acne or oily skin. Acne may develop during adulthood, and it may not respond to treatment.  Pelvic pain.  Weight gain or obesity.  Patches of thickened and dark brown or black skin on the neck, arms, breasts, or thighs (acanthosis nigricans).  Excess hair growth on the face, chest, abdomen, or upper thighs (hirsutism).  How is this diagnosed? This condition is diagnosed based on:  Your medical history.  A physical exam, including a pelvic exam. Your health care provider may look for areas of increased hair growth on your skin.  Tests, such as: ? Ultrasound. This may be used to examine the ovaries and the lining of the  uterus (endometrium) for cysts. ? Blood tests. These may be used to check levels of sugar (glucose), female hormone (testosterone), and female hormones (estrogen and progesterone) in your blood.  How is this treated? There is no cure for PCOS, but treatment can help to manage symptoms and prevent more health problems from developing. Treatment varies depending on:  Your symptoms.  Whether you want to have a baby or whether you need birth control (contraception).  Treatment may include nutrition and lifestyle changes along with:  Progesterone hormone to start a menstrual period.  Birth control pills to help you have regular menstrual periods.  Medicines to make you ovulate, if you want to get pregnant.  Medicine to reduce excessive hair growth.  Surgery, in severe cases. This may involve making small holes in one or both of your ovaries. This decreases the amount of testosterone that your body produces.  Follow these instructions at home:  Take over-the-counter and prescription medicines only as told by your health care provider.  Follow a healthy meal plan. This can help you reduce the effects of PCOS. ? Eat a healthy diet that includes lean proteins, complex carbohydrates, fresh fruits and vegetables, low-fat dairy products, and healthy fats. Make sure to eat enough fiber.  If you are overweight, lose weight as told by your health care provider. ? Losing 10% of your body weight may improve symptoms. ? Your health care provider can determine how much weight loss is best for you and can help you lose weight safely.  Keep all follow-up visits as told by   your health care provider. This is important. Contact a health care provider if:  Your symptoms do not get better with medicine.  You develop new symptoms. This information is not intended to replace advice given to you by your health care provider. Make sure you discuss any questions you have with your health care  provider. Document Released: 06/30/2004 Document Revised: 11/02/2015 Document Reviewed: 08/22/2015 Elsevier Interactive Patient Education  2018 [place] [employer].   EXERCISE AND DIET:  We recommended that you start or continue a regular exercise program for good health. Regular exercise means any activity that makes your heart beat faster and makes you sweat.  We recommend exercising at least 30 minutes per day at least 3 days a week, preferably 4 or 5.  We also recommend a diet low in fat and sugar.  Inactivity, poor dietary choices and obesity can cause diabetes, heart attack, stroke, and kidney damage, among others.    ALCOHOL AND SMOKING:  Women should limit their alcohol intake to no more than 7 drinks/beers/glasses of wine (combined, not each!) per week. Moderation of alcohol intake to this level decreases your risk of breast cancer and liver damage. And of course, no recreational drugs are part of a healthy lifestyle.  And absolutely no smoking or even second hand smoke. Most people know smoking can cause heart and lung diseases, but did you know it also contributes to weakening of your bones? Aging of your skin?  Yellowing of your teeth and nails?  CALCIUM AND VITAMIN D:  Adequate intake of calcium and Vitamin D are recommended.  The recommendations for exact amounts of these supplements seem to change often, but generally speaking 600 mg of calcium (either carbonate or citrate) and 800 units of Vitamin D per day seems prudent. Certain women may benefit from higher intake of Vitamin D.  If you are among these women, your doctor will have told you during your visit.    PAP SMEARS:  Pap smears, to check for cervical cancer or precancers,  have traditionally been done yearly, although recent scientific advances have shown that most women can have pap smears less often.  However, every woman still should have a physical exam from her gynecologist every year. It will  include a breast check, inspection of the vulva and vagina to check for abnormal growths or skin changes, a visual exam of the cervix, and then an exam to evaluate the size and shape of the uterus and ovaries.  And after 46 years of age, a rectal exam is indicated to check for rectal cancers. We will also provide age appropriate advice regarding health maintenance, like when you should have certain vaccines, screening for sexually transmitted diseases, bone density testing, colonoscopy, mammograms, etc.   MAMMOGRAMS:  All women over 109 years old should have a yearly mammogram. Many facilities now offer a "3D" mammogram, which may cost around $50 extra out of pocket. If possible,  we recommend you accept the option to have the 3D mammogram performed.  It both reduces the number of women who will be called back for extra views which then turn out to be normal, and it is better than the routine mammogram at detecting truly abnormal areas.    COLONOSCOPY:  Colonoscopy to screen for colon cancer is recommended for all women at age 88.  We know, you hate the idea of the prep.  We agree, BUT, having colon cancer and not knowing it is worse!!  Colon cancer  so often starts as a polyp that can be seen and removed at colonscopy, which can quite literally save your life!  And if your first colonoscopy is normal and you have no family history of colon cancer, most women don't have to have it again for 10 years.  Once every ten years, you can do something that may end up saving your life, right?  We will be happy to help you get it scheduled when you are ready.  Be sure to check your insurance coverage so you understand how much it will cost.  It may be covered as a preventative service at no cost, but you should check your particular policy.

## 2017-11-25 ENCOUNTER — Other Ambulatory Visit: Payer: Self-pay | Admitting: Obstetrics and Gynecology

## 2017-11-26 NOTE — Telephone Encounter (Signed)
Medication refill request: Lexapro 10mg   Last AEX:  11/02/17 Next AEX: nothing scheduled at this time  Last MMG (if hormonal medication request): 04/25/17 Bi-rads  Category 1 neg  Refill authorized: Pharmacy requesting 90 day supply

## 2018-03-31 ENCOUNTER — Ambulatory Visit (HOSPITAL_COMMUNITY)
Admission: EM | Admit: 2018-03-31 | Discharge: 2018-03-31 | Disposition: A | Discharge status: Home / Self Care | Payer: BLUE CROSS/BLUE SHIELD

## 2018-03-31 ENCOUNTER — Encounter (HOSPITAL_COMMUNITY): Payer: Self-pay | Admitting: Emergency Medicine

## 2018-03-31 ENCOUNTER — Other Ambulatory Visit: Payer: Self-pay

## 2018-03-31 DIAGNOSIS — J00 Acute nasopharyngitis [common cold]: Secondary | ICD-10-CM | POA: Diagnosis not present

## 2018-03-31 NOTE — Discharge Instructions (Signed)
Advised Zyrtec-D along with Aleve in the morning and at night.

## 2018-03-31 NOTE — ED Triage Notes (Signed)
The patient presented to the [facility] with a complaint of a cough, congestion and runny nose x 5 days. The patient denied any known fever.

## 2018-03-31 NOTE — ED Provider Notes (Signed)
03/31/2018 12:03 PM   DOB: 1970-07-16 / MRN: [id number]  SUBJECTIVE:  Gail [name] is a 48 y.o. female presenting for cough and nasal congestion for the last 5 days.  She denies fever.  She denies chest pain and shortness of breath.  She has history of diabetes.   She is allergic to penicillins.   She  has a past medical history of Anemia, Anxiety, Depression, Diabetes mellitus without complication ([place]), Dyspnea, Hearing loss, History of allergic rhinitis, History of anxiety, History of blood transfusion, History of depression, History of hyperlipidemia, History of syncope, Seizures ([place]), and Sleep apnea.    She  reports that she has never smoked. She has never used smokeless tobacco. She reports current alcohol use. She reports that she does not use drugs. She  reports being sexually active. She reports using the following method of birth control/protection: None. The patient  has a past surgical history that includes Cesarean section (1991, 1995); Wisdom tooth extraction; Total laparoscopic hysterectomy with salpingectomy (Bilateral, 12/19/2016); and Cystoscopy (N/A, 12/19/2016).  Her family history includes Diabetes in her father, paternal grandfather, and paternal grandmother; Prostate cancer in her paternal grandfather.  ROS per HPI  OBJECTIVE:  BP 136/86 (BP Location: Left Arm)   Pulse 95   Temp 98.8 F (37.1 C) (Oral)   Resp 18   LMP 12/19/2016 (Exact Date)   SpO2 99%   Wt Readings from Last 3 Encounters:  11/02/17 258 lb (117 kg)  09/26/17 257 lb 8 oz (116.8 kg)  03/07/17 255 lb (115.7 kg)   Temp Readings from Last 3 Encounters:  03/31/18 98.8 F (37.1 C) (Oral)  09/26/17 99.9 F (37.7 C) (Oral)  12/19/16 98.2 F (36.8 C) (Oral)   BP Readings from Last 3 Encounters:  03/31/18 136/86  11/02/17 122/80  09/26/17 122/80   Pulse Readings from Last 3 Encounters:  03/31/18 95  11/02/17 84  09/26/17 77    Physical Exam Vitals signs reviewed.  Constitutional:    General: She is not in acute distress.    Appearance: She is not diaphoretic.  Eyes:     Pupils: Pupils are equal, round, and reactive to light.  Cardiovascular:     Rate and Rhythm: Normal rate and regular rhythm.     Comments: Heart rate 80 bpm by my assessment. Pulmonary:     Effort: Pulmonary effort is normal.  Abdominal:     General: There is no distension.  Skin:    General: Skin is dry.  Neurological:     Mental Status: She is alert and oriented to person, place, and time.     Cranial Nerves: No cranial nerve deficit.     Gait: Gait normal.     No results found for this or any previous visit (from the past 72 hour(s)).  No results found.  ASSESSMENT AND PLAN:   Common cold: Excellent physical exam.  Advised Zyrtec-D and Aleve morning and night.  Advised that she is safe to go back to work.    Discharge Instructions     Advised Zyrtec-D along with Aleve in the morning and at night.        The patient is advised to call or return to clinic if she does not see an improvement in symptoms, or to seek the care of the closest emergency department if she worsens with the above plan.   Philis Fendt, MHS, PA-C 03/31/2018 12:03 PM   Tereasa Coop, PA-C 03/31/18 1203

## 2018-08-02 ENCOUNTER — Other Ambulatory Visit: Payer: Self-pay | Admitting: *Deleted

## 2018-08-02 MED ORDER — MIGLITOL 25 MG PO TABS: ORAL_TABLET | ORAL | 2 refills | Status: DC

## 2018-08-02 NOTE — Telephone Encounter (Signed)
Med refilled and [facility] will reach out to pt and try and get CPE scheduled

## 2018-08-02 NOTE — Telephone Encounter (Signed)
Please schedule PE for august and refill until then  Thanks

## 2018-08-02 NOTE — Telephone Encounter (Signed)
Last CPE was on 09/26/2017 and no future appts, last filled on 07/12/2017 #60 tabs with 2 refills, please advise

## 2018-08-31 ENCOUNTER — Other Ambulatory Visit: Payer: Self-pay | Admitting: Family Medicine

## 2018-09-24 DIAGNOSIS — R040 Epistaxis: Secondary | ICD-10-CM | POA: Diagnosis not present

## 2018-09-24 DIAGNOSIS — Z6841 Body Mass Index (BMI) 40.0 and over, adult: Secondary | ICD-10-CM | POA: Diagnosis not present

## 2018-09-24 DIAGNOSIS — Z136 Encounter for screening for cardiovascular disorders: Secondary | ICD-10-CM | POA: Diagnosis not present

## 2018-09-29 ENCOUNTER — Other Ambulatory Visit: Payer: Self-pay | Admitting: Family Medicine

## 2018-10-01 MED ORDER — ATORVASTATIN CALCIUM 20 MG PO TABS: 20.0000 mg | ORAL_TABLET | Freq: Every day | ORAL | 0 refills | Status: DC

## 2018-10-01 MED ORDER — FENOFIBRIC ACID 135 MG PO CPDR: 135.0000 mg | DELAYED_RELEASE_CAPSULE | Freq: Every day | ORAL | 0 refills | Status: DC

## 2018-10-01 NOTE — Addendum Note (Signed)
Addended by: Tammi Sou on: 10/01/2018 02:48 PM   Modules accepted: Orders

## 2018-10-14 ENCOUNTER — Telehealth: Payer: Self-pay | Admitting: Family Medicine

## 2018-10-14 DIAGNOSIS — E781 Pure hyperglyceridemia: Secondary | ICD-10-CM

## 2018-10-14 DIAGNOSIS — E282 Polycystic ovarian syndrome: Secondary | ICD-10-CM

## 2018-10-14 DIAGNOSIS — Z0000 Encounter for general adult medical examination without abnormal findings: Secondary | ICD-10-CM

## 2018-10-14 DIAGNOSIS — R7303 Prediabetes: Secondary | ICD-10-CM

## 2018-10-14 NOTE — Telephone Encounter (Signed)
-----   Message from Ellamae Sia sent at 10/08/2018 10:43 AM EDT ----- Regarding: Lab orders for Tuesday, 7.28.20 Patient is scheduled for CPX labs, please order future labs, Thanks , Karna Christmas

## 2018-10-15 ENCOUNTER — Other Ambulatory Visit (INDEPENDENT_AMBULATORY_CARE_PROVIDER_SITE_OTHER): Payer: BC Managed Care – PPO

## 2018-10-15 ENCOUNTER — Other Ambulatory Visit: Payer: Self-pay

## 2018-10-15 DIAGNOSIS — E781 Pure hyperglyceridemia: Secondary | ICD-10-CM

## 2018-10-15 DIAGNOSIS — R7303 Prediabetes: Secondary | ICD-10-CM

## 2018-10-15 DIAGNOSIS — Z0000 Encounter for general adult medical examination without abnormal findings: Secondary | ICD-10-CM

## 2018-10-15 LAB — CBC WITH DIFFERENTIAL/PLATELET
Basophils Absolute: 0 10*3/uL (ref 0.0–0.1)
Basophils Relative: 0.6 % (ref 0.0–3.0)
Eosinophils Absolute: 0.1 10*3/uL (ref 0.0–0.7)
Eosinophils Relative: 1.7 % (ref 0.0–5.0)
HCT: 39.3 % (ref 36.0–46.0)
Hemoglobin: 13 g/dL (ref 12.0–15.0)
Lymphocytes Relative: 38.1 % (ref 12.0–46.0)
Lymphs Abs: 2.1 10*3/uL (ref 0.7–4.0)
MCHC: 33.1 g/dL (ref 30.0–36.0)
MCV: 88.9 fl (ref 78.0–100.0)
Monocytes Absolute: 0.3 10*3/uL (ref 0.1–1.0)
Monocytes Relative: 5.3 % (ref 3.0–12.0)
Neutro Abs: 3.1 10*3/uL (ref 1.4–7.7)
Neutrophils Relative %: 54.3 % (ref 43.0–77.0)
Platelets: 239 10*3/uL (ref 150.0–400.0)
RBC: 4.42 Mil/uL (ref 3.87–5.11)
RDW: 13.8 % (ref 11.5–15.5)
WBC: 5.6 10*3/uL (ref 4.0–10.5)

## 2018-10-15 LAB — LIPID PANEL
Cholesterol: 181 mg/dL (ref 0–200)
HDL: 50 mg/dL (ref >39.00)
NonHDL: 130.87
Total CHOL/HDL Ratio: 4
Triglycerides: 207 mg/dL — ABNORMAL HIGH (ref 0.0–149.0)
VLDL: 41.4 mg/dL — ABNORMAL HIGH (ref 0.0–40.0)

## 2018-10-15 LAB — COMPREHENSIVE METABOLIC PANEL
ALT: 18 U/L (ref 0–35)
AST: 17 U/L (ref 0–37)
Albumin: 4.4 g/dL (ref 3.5–5.2)
Alkaline Phosphatase: 47 U/L (ref 39–117)
BUN: 11 mg/dL (ref 6–23)
CO2: 28 mEq/L (ref 19–32)
Calcium: 9.1 mg/dL (ref 8.4–10.5)
Chloride: 104 mEq/L (ref 96–112)
Creatinine, Ser: 0.73 mg/dL (ref 0.40–1.20)
GFR: 85.49 mL/min (ref >60.00)
Glucose, Bld: 120 mg/dL — ABNORMAL HIGH (ref 70–99)
Potassium: 4 mEq/L (ref 3.5–5.1)
Sodium: 141 mEq/L (ref 135–145)
Total Bilirubin: 0.5 mg/dL (ref 0.2–1.2)
Total Protein: 6.7 g/dL (ref 6.0–8.3)

## 2018-10-15 LAB — LDL CHOLESTEROL, DIRECT: Direct LDL: 109 mg/dL

## 2018-10-15 LAB — HEMOGLOBIN A1C: Hgb A1c MFr Bld: 6.3 % (ref 4.6–6.5)

## 2018-10-15 LAB — TSH: TSH: 1.44 u[IU]/mL (ref 0.35–4.50)

## 2018-10-22 ENCOUNTER — Ambulatory Visit (INDEPENDENT_AMBULATORY_CARE_PROVIDER_SITE_OTHER): Payer: BC Managed Care – PPO | Admitting: Family Medicine

## 2018-10-22 ENCOUNTER — Other Ambulatory Visit: Payer: Self-pay

## 2018-10-22 ENCOUNTER — Encounter: Payer: Self-pay | Admitting: Family Medicine

## 2018-10-22 VITALS — BP 128/82 | HR 94 | Temp 97.5°F | Ht 66.0 in | Wt 261.2 lb

## 2018-10-22 DIAGNOSIS — E162 Hypoglycemia, unspecified: Secondary | ICD-10-CM

## 2018-10-22 DIAGNOSIS — Z0000 Encounter for general adult medical examination without abnormal findings: Secondary | ICD-10-CM | POA: Diagnosis not present

## 2018-10-22 DIAGNOSIS — F411 Generalized anxiety disorder: Secondary | ICD-10-CM | POA: Diagnosis not present

## 2018-10-22 DIAGNOSIS — E781 Pure hyperglyceridemia: Secondary | ICD-10-CM

## 2018-10-22 DIAGNOSIS — E282 Polycystic ovarian syndrome: Secondary | ICD-10-CM

## 2018-10-22 DIAGNOSIS — Z1231 Encounter for screening mammogram for malignant neoplasm of breast: Secondary | ICD-10-CM

## 2018-10-22 DIAGNOSIS — R7303 Prediabetes: Secondary | ICD-10-CM

## 2018-10-22 MED ORDER — ATORVASTATIN CALCIUM 20 MG PO TABS: 20.0000 mg | ORAL_TABLET | Freq: Every day | ORAL | 3 refills | Status: DC

## 2018-10-22 MED ORDER — MIGLITOL 25 MG PO TABS: 25.0000 mg | ORAL_TABLET | Freq: Two times a day (BID) | ORAL | 3 refills | Status: DC

## 2018-10-22 MED ORDER — FENOFIBRIC ACID 135 MG PO CPDR: 135.0000 mg | DELAYED_RELEASE_CAPSULE | Freq: Every day | ORAL | 3 refills | Status: DC

## 2018-10-22 NOTE — Progress Notes (Signed)
Subjective:    Patient ID: Gail [name], female    DOB: 06/26/1970, 48 y.o.   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    [facility] Readings from Last 3 Encounters:  10/22/18 261 lb 4 oz (118.5 kg)  11/02/17 258 lb (117 kg)  09/26/17 257 lb 8 oz (116.8 kg)  taking care of herself  Exercise- started beach body and loves it  42.17 kg/m   Eating pretty healthy overall  Has to for her blood sugar    BP Readings from Last 3 Encounters:  10/22/18 128/82  03/31/18 136/86  11/02/17 122/80   Pulse Readings from Last 3 Encounters:  10/22/18 94  03/31/18 95  11/02/17 84  pulse is always in 90s in hte office  Mammogram 2/19 -needs to get one /[facility]  Self breast exam -no lumps   Pap 7/18 neg with neg HPV (Dr Quincy Simmonds)  Total hysterectomy 10/18 Still goes every year for visit   Tdap 9/13 Flu shots -declines / gets vomiting from the vaccine   HIV screen 5/17  H/o reactive hypoglycemia and pcos  Sees Dr Forde Dandy  Lab Results  Component Value Date   HGBA1C 6.3 10/15/2018  doing well  Down from 6.4  Stays on a schedule- eats every 3 hours with protein  Does well   Mood, h/o anxiety  Takes lexapro 10 mg daily  Still works well  Exercise helps also  Sleep is better as well    Cholesterol/ hx of high trig  Lab Results  Component Value Date   CHOL 181 10/15/2018   CHOL 196 09/25/2017   CHOL 135 07/21/2016   Lab Results  Component Value Date   HDL 50.00 10/15/2018   HDL 48.40 09/25/2017   HDL 47 (L) 07/21/2016   Lab Results  Component Value Date   LDLCALC 110 (H) 09/25/2017   LDLCALC 68 07/21/2016   LDLCALC 77 01/18/2015   Lab Results  Component Value Date   TRIG 207.0 (H) 10/15/2018   TRIG 186.0 (H) 09/25/2017   TRIG 101 07/21/2016   Lab Results  Component Value Date   CHOLHDL 4 10/15/2018   CHOLHDL 4 09/25/2017   CHOLHDL 2.9 07/21/2016   Lab Results  Component Value Date   LDLDIRECT 109.0 10/15/2018   LDLDIRECT 106.4 12/05/2012   Taking fibrate and lipitor 10 mg  LDL up to 110 - may have missed dose of atorvastatin - she left it when she went on vacation   Lab Results  Component Value Date   CREATININE 0.73 10/15/2018   BUN 11 10/15/2018   NA 141 10/15/2018   K 4.0 10/15/2018   CL 104 10/15/2018   CO2 28 10/15/2018   Lab Results  Component Value Date   ALT 18 10/15/2018   AST 17 10/15/2018   ALKPHOS 47 10/15/2018   BILITOT 0.5 10/15/2018    Lab Results  Component Value Date   WBC 5.6 10/15/2018   HGB 13.0 10/15/2018   HCT 39.3 10/15/2018   MCV 88.9 10/15/2018   PLT 239.0 10/15/2018    Lab Results  Component Value Date   TSH 1.44 10/15/2018    Patient Active Problem List   Diagnosis Date Noted  . Screening mammogram, encounter for 10/22/2018  . PCOS (polycystic ovarian syndrome) 09/26/2017  .  Status post total abdominal hysterectomy 12/19/2016  . Insomnia 07/22/2016  . Auditory processing disorder 04/04/2016  . Encounter for routine gynecological examination 11/27/2014  . Morbid obesity ([place]) 12/13/2012  . Prediabetes 12/13/2012  . Other screening mammogram 12/12/2011  . Routine general medical examination at a health care facility 12/04/2011  . HYPERTRIGLYCERIDEMIA 04/07/2008  . PROBLEMS WITH HEARING 04/07/2008  . MAMMOGRAM, ABNORMAL 03/18/2008  . Anxiety disorder 02/07/2008  . ALLERGIC RHINITIS 02/07/2008  . HYPOGLYCEMIA, REACTIVE 11/15/2006   Past Medical History:  Diagnosis Date  . Anemia   . Anxiety   . Depression   . Diabetes mellitus without complication ([place])    type 2  . Dyspnea   . Hearing loss    wears hearing aids bilateral  . History of allergic rhinitis   . History of anxiety   . History of blood transfusion    [facility] 09/2016 2 units transfused  . History of depression   . History of hyperlipidemia   . History of syncope    . Seizures ([place])    hx seizures as a child,no seizures since age 78 yrs old  . Sleep apnea    uses mouth guard at night   Past Surgical History:  Procedure Laterality Date  . [place]   x 2  . CYSTOSCOPY N/A 12/19/2016   Procedure: CYSTOSCOPY;  Surgeon: Nunzio Cobbs, MD;  Location: [facility];  Service: Gynecology;  Laterality: N/A;  . TOTAL LAPAROSCOPIC HYSTERECTOMY WITH SALPINGECTOMY Bilateral 12/19/2016   Procedure: HYSTERECTOMY TOTAL LAPAROSCOPIC WITH SALPINGECTOMY;  Surgeon: Nunzio Cobbs, MD;  Location: [facility];  Service: Gynecology;  Laterality: Bilateral;  . WISDOM TOOTH EXTRACTION     Social History   Tobacco Use  . Smoking status: Never Smoker  . Smokeless tobacco: Never Used  Substance Use Topics  . Alcohol use: Yes    Alcohol/week: 0.0 standard drinks    Comment: occasional  winie  . Drug use: No   Family History  Problem Relation Age of Onset  . Diabetes Father   . Diabetes Paternal Grandfather   . Prostate cancer Paternal Grandfather   . Diabetes Paternal Grandmother    Allergies  Allergen Reactions  . Influenza Vaccine Recombinant     Vomiting   . Penicillins Hives    Has patient had a PCN reaction causing immediate rash, facial/tongue/throat swelling, SOB or lightheadedness with hypotension: Yes Has patient had a PCN reaction causing severe rash involving mucus membranes or skin necrosis: No Has patient had a PCN reaction that required hospitalization: No Has patient had a PCN reaction occurring within the last 10 years: No If all of the above answers are "NO", then may proceed with Cephalosporin use.    Current Outpatient Medications on File Prior to Visit  Medication Sig Dispense Refill  . escitalopram (LEXAPRO) 10 MG tablet TAKE 1 TABLET BY MOUTH EVERY DAY 90 tablet 3   No current facility-administered medications on file prior to visit.      Review of Systems  Constitutional: Positive for fatigue.  Negative for activity change, appetite change, fever and unexpected weight change.  HENT: Negative for congestion, ear pain, rhinorrhea, sinus pressure and sore throat.   Eyes: Negative for pain, redness and visual disturbance.  Respiratory: Negative for cough, shortness of breath and wheezing.   Cardiovascular: Negative for chest pain and palpitations.  Gastrointestinal: Negative for abdominal pain, blood in stool, constipation and diarrhea.  Endocrine: Negative for polydipsia and polyuria.  Genitourinary: Negative for dysuria, frequency and urgency.  Musculoskeletal: Negative for arthralgias, back pain and myalgias.  Skin: Negative for pallor and rash.  Allergic/Immunologic: Negative for environmental allergies.  Neurological: Negative for dizziness, syncope and headaches.  Hematological: Negative for adenopathy. Does not bruise/bleed easily.  Psychiatric/Behavioral: Negative for decreased concentration and dysphoric mood. The patient is nervous/anxious.        Stressors  Mood is stable       Objective:   Physical Exam Constitutional:      General: She is not in acute distress.    Appearance: Normal appearance. She is well-developed. She is obese. She is not ill-appearing.  HENT:     Head: Normocephalic and atraumatic.     Right Ear: Tympanic membrane, ear canal and external ear normal.     Left Ear: Tympanic membrane, ear canal and external ear normal.     Nose: Nose normal. No rhinorrhea.     Mouth/Throat:     Mouth: Mucous membranes are moist.     Pharynx: Oropharynx is clear. No posterior oropharyngeal erythema.  Eyes:     General: No scleral icterus.       Right eye: No discharge.        Left eye: No discharge.     Conjunctiva/sclera: Conjunctivae normal.     Pupils: Pupils are equal, round, and reactive to light.  Neck:     Musculoskeletal: Normal range of motion and neck supple. No neck rigidity.     Thyroid: No thyromegaly.     Vascular: No carotid bruit or JVD.   Cardiovascular:     Rate and Rhythm: Regular rhythm. Tachycardia present.     Pulses: Normal pulses.     Heart sounds: Normal heart sounds. No gallop.   Pulmonary:     Effort: Pulmonary effort is normal. No respiratory distress.     Breath sounds: Normal breath sounds. No wheezing or rales.     Comments: Good air exch Abdominal:     General: Bowel sounds are normal. There is no distension.     Palpations: Abdomen is soft. There is no mass.     Tenderness: There is no abdominal tenderness.     Hernia: No hernia is present.  Genitourinary:    Comments: Sees gyn for breast/pelvic exam Musculoskeletal:        General: No tenderness.     Right lower leg: No edema.     Left lower leg: No edema.  Lymphadenopathy:     Cervical: No cervical adenopathy.  Skin:    General: Skin is warm and dry.     Coloration: Skin is not pale.     Findings: No erythema or rash.     Comments: Solar lentigines diffusely   Neurological:     Mental Status: She is alert.     Cranial Nerves: No cranial nerve deficit.     Motor: No abnormal muscle tone.     Coordination: Coordination normal.     Gait: Gait normal.     Deep Tendon Reflexes: Reflexes are normal and symmetric. Reflexes normal.  Psychiatric:        Mood and Affect: Mood normal.           Assessment & Plan:   Problem List Items Addressed This Visit      Endocrine   HYPOGLYCEMIA, REACTIVE    Continues to see Dr Forde Dandy Does well with frequent meals with protein  Takes glyset       PCOS (polycystic ovarian  syndrome)    With insulin insensitivity  No clinical changes Lab Results  Component Value Date   HGBA1C 6.3 10/15/2018   No longer taking aldactone  Had hysterectomy and follows up with gyn        Other   HYPERTRIGLYCERIDEMIA    Pt takes statin and fibrate Disc goals for lipids and reasons to control them Rev last labs with pt Rev low sat fat diet in detail  LDL is up from 68 to 110  Thinks she missed doses-this would  explain it Counseled on compliance with medication and diet       Relevant Medications   Choline Fenofibrate (FENOFIBRIC ACID) 135 MG CPDR   atorvastatin (LIPITOR) 20 MG tablet   Anxiety disorder    lexapro 10 mg continues to work well for her  Px by Dr Quincy Simmonds  Reviewed stressors/ coping techniques/symptoms/ support sources/ tx options and side effects in detail today  Enc regular exercise and good self care      Routine general medical examination at a health care facility - Primary    Reviewed health habits including diet and exercise and skin cancer prevention Reviewed appropriate screening tests for age  Also reviewed health mt list, fam hx and immunization status , as well as social and family history   See HPI Labs reviewed  Enc healthy diet and exercise for wt loss  Mammogram referral done-she will make her own appt Enc compliance with cholesterol medicine Has f/u with gyn upcoming      Morbid obesity ([place])    Discussed how this problem influences overall health and the risks it imposes  Reviewed plan for weight loss with lower calorie diet (via better food choices and also portion control or program like weight watchers) and exercise building up to or more than 30 minutes 5 days per week including some aerobic activity   Pt struggles with inability to loose wt- pcos may play a role        Relevant Medications   miglitol (GLYSET) 25 MG tablet   Prediabetes    Lab Results  Component Value Date   HGBA1C 6.3 10/15/2018   Sees endocrinology for this and reactive hypoglycemia  disc imp of low glycemic diet and wt loss to prevent DM2       Screening mammogram, encounter for    Ref for  annual screening mammogram Nl breast exam today  Encouraged monthly self exams        Relevant Orders   MM 3D SCREEN BREAST BILATERAL

## 2018-10-22 NOTE — Patient Instructions (Addendum)
Call and make your mammogram appt. I did the referral   Keep up the exercise   Eat a healthy diet   Don't miss doses of your cholesterol medicine - numbers are up and that is likely due to missed doses  See your gyn as planned

## 2018-10-22 NOTE — Assessment & Plan Note (Signed)
Continues to see Dr Forde Dandy Does well with frequent meals with protein  Takes glyset

## 2018-10-22 NOTE — Assessment & Plan Note (Signed)
Discussed how this problem influences overall health and the risks it imposes  Reviewed plan for weight loss with lower calorie diet (via better food choices and also portion control or program like weight watchers) and exercise building up to or more than 30 minutes 5 days per week including some aerobic activity   Pt struggles with inability to loose wt- pcos may play a role

## 2018-10-22 NOTE — Assessment & Plan Note (Signed)
With insulin insensitivity  No clinical changes Lab Results  Component Value Date   HGBA1C 6.3 10/15/2018   No longer taking aldactone  Had hysterectomy and follows up with gyn

## 2018-10-22 NOTE — Assessment & Plan Note (Signed)
Lab Results  Component Value Date   HGBA1C 6.3 10/15/2018   Sees endocrinology for this and reactive hypoglycemia  disc imp of low glycemic diet and wt loss to prevent DM2

## 2018-10-22 NOTE — Assessment & Plan Note (Signed)
Pt takes statin and fibrate Disc goals for lipids and reasons to control them Rev last labs with pt Rev low sat fat diet in detail  LDL is up from 68 to 110  Thinks she missed doses-this would explain it Counseled on compliance with medication and diet

## 2018-10-22 NOTE — Assessment & Plan Note (Signed)
Ref for annual screening mammogram Nl breast exam today  Encouraged monthly self exams   

## 2018-10-22 NOTE — Assessment & Plan Note (Signed)
lexapro 10 mg continues to work well for her  Px by Dr Quincy Simmonds  Reviewed stressors/ coping techniques/symptoms/ support sources/ tx options and side effects in detail today  Enc regular exercise and good self care

## 2018-10-22 NOTE — Assessment & Plan Note (Signed)
Reviewed health habits including diet and exercise and skin cancer prevention Reviewed appropriate screening tests for age  Also reviewed health mt list, fam hx and immunization status , as well as social and family history   See HPI Labs reviewed  Enc healthy diet and exercise for wt loss  Mammogram referral done-she will make her own appt Enc compliance with cholesterol medicine Has f/u with gyn upcoming

## 2018-12-24 DIAGNOSIS — H02401 Unspecified ptosis of right eyelid: Secondary | ICD-10-CM | POA: Diagnosis not present

## 2018-12-24 DIAGNOSIS — H04123 Dry eye syndrome of bilateral lacrimal glands: Secondary | ICD-10-CM | POA: Diagnosis not present

## 2018-12-24 DIAGNOSIS — H2513 Age-related nuclear cataract, bilateral: Secondary | ICD-10-CM | POA: Diagnosis not present

## 2019-01-24 ENCOUNTER — Other Ambulatory Visit: Payer: Self-pay

## 2019-01-24 MED ORDER — ESCITALOPRAM OXALATE 10 MG PO TABS: 10.0000 mg | ORAL_TABLET | Freq: Every day | ORAL | 1 refills | Status: DC

## 2019-01-24 NOTE — Telephone Encounter (Signed)
Patient called asking if Dr tower would be willing to refill her Lexapro. This has been filled by her gyn (nots in epic) but because she does not have an appointment with GYN they will not refill it and patient has 1 tablet left. GYN office is booked up until January and she is on waiting list to get in with them for CPE. Please review.

## 2019-03-13 ENCOUNTER — Telehealth: Payer: Self-pay | Admitting: *Deleted

## 2019-03-13 MED ORDER — CHERATUSSIN AC 100-10 MG/5ML PO SOLN: 5.0000 mL | Freq: Two times a day (BID) | ORAL | 0 refills | Status: DC | PRN

## 2019-03-13 NOTE — Telephone Encounter (Signed)
Pt's husband called Triage. Pt has covid and has a bad cough and pt and spouse would like a cough med called in. Pt has a worsening cough he said it's not productive it's a dry cough. Pt has no SOB or trouble breathing but a severe cough, they would like med send in to help.  [facility]  PCP not in office will route to provider here at office and PCP so she is aware

## 2019-03-13 NOTE — Telephone Encounter (Signed)
When was symptom onset and where'd she get tested for covid? How is she feeling overall? Any fever? Ensure self isolating.  I will send in codeine cough syrup for weekend. But recommend in person eval at local [facility] if worsening shortness of breath at rest develops 7-10d into illness.

## 2019-03-13 NOTE — Telephone Encounter (Signed)
Spoke with pt asking about sxs.  States she has a cough, fatigue and low grade fever- 99-100.  She was tested yesterday at [facility] on [address].  I relayed Dr. Synthia Innocent message.  Pt verbalizes understanding and expresses her thanks.  Fyi to Dr. Darnell Level.

## 2019-03-19 NOTE — Telephone Encounter (Signed)
Left VM requesting pt to call the office back.  F/u call for Covid sxs. Calling for an update and to also see if test was positive or negative, I will await pt's call back

## 2019-04-16 ENCOUNTER — Telehealth: Payer: Self-pay | Admitting: *Deleted

## 2019-04-16 NOTE — Telephone Encounter (Signed)
I need to know the date her test was positive and the date her symptoms started (also how she is feeling now) -then I will get working on it Thanks

## 2019-04-16 NOTE — Telephone Encounter (Signed)
Patient called stating that she was diagnosed with covid back late December at [facility]. Patient stated that she did call the office and was given some cough medication for her cough. Patient stated that she was out of work 03/24/19 thru 03/28/19. Patient wants to know if Dr. Glori Bickers can give her a work note for her being out because of covid?  Patient stated that she has a copy of her positive test results if Dr. Glori Bickers needs it. Patient stated that if Dr. Glori Bickers can't give her the note she will contact [facility] since they did the test.

## 2019-04-17 NOTE — Telephone Encounter (Signed)
Left VM requesting pt to call the office back 

## 2019-04-18 NOTE — Telephone Encounter (Signed)
Pt called back. Tested positive 03-12-19. Symptoms started 03-15-19. 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 She feels much better now.

## 2019-04-18 NOTE — Telephone Encounter (Signed)
Done and in IN box 

## 2019-04-18 NOTE — Telephone Encounter (Signed)
Left VM letting pt know letter is ready for pick up. Letter placed at the front desk for pick up

## 2019-05-31 DIAGNOSIS — M25561 Pain in right knee: Secondary | ICD-10-CM | POA: Diagnosis not present

## 2019-06-17 DIAGNOSIS — H9190 Unspecified hearing loss, unspecified ear: Secondary | ICD-10-CM | POA: Diagnosis not present

## 2019-06-17 DIAGNOSIS — H903 Sensorineural hearing loss, bilateral: Secondary | ICD-10-CM | POA: Diagnosis not present

## 2019-07-08 ENCOUNTER — Other Ambulatory Visit: Payer: Self-pay | Admitting: Family Medicine

## 2019-08-13 ENCOUNTER — Other Ambulatory Visit: Payer: Self-pay | Admitting: Family Medicine

## 2019-08-14 ENCOUNTER — Telehealth: Payer: Self-pay

## 2019-08-14 ENCOUNTER — Other Ambulatory Visit: Payer: Self-pay

## 2019-08-14 ENCOUNTER — Emergency Department (HOSPITAL_COMMUNITY): Payer: BC Managed Care – PPO

## 2019-08-14 ENCOUNTER — Encounter (HOSPITAL_COMMUNITY): Payer: Self-pay | Admitting: *Deleted

## 2019-08-14 ENCOUNTER — Emergency Department (HOSPITAL_COMMUNITY)
Admission: EM | Admit: 2019-08-14 | Discharge: 2019-08-15 | Disposition: A | Discharge status: Home / Self Care | Payer: BC Managed Care – PPO | Attending: Emergency Medicine | Admitting: Emergency Medicine

## 2019-08-14 DIAGNOSIS — R1013 Epigastric pain: Secondary | ICD-10-CM | POA: Insufficient documentation

## 2019-08-14 DIAGNOSIS — Z79899 Other long term (current) drug therapy: Secondary | ICD-10-CM | POA: Diagnosis not present

## 2019-08-14 DIAGNOSIS — R109 Unspecified abdominal pain: Secondary | ICD-10-CM | POA: Diagnosis not present

## 2019-08-14 DIAGNOSIS — R110 Nausea: Secondary | ICD-10-CM | POA: Insufficient documentation

## 2019-08-14 LAB — CBC
HCT: 42 % (ref 36.0–46.0)
Hemoglobin: 13.6 g/dL (ref 12.0–15.0)
MCH: 28.6 pg (ref 26.0–34.0)
MCHC: 32.4 g/dL (ref 30.0–36.0)
MCV: 88.2 fL (ref 80.0–100.0)
Platelets: 268 10*3/uL (ref 150–400)
RBC: 4.76 MIL/uL (ref 3.87–5.11)
RDW: 13.2 % (ref 11.5–15.5)
WBC: 6.6 10*3/uL (ref 4.0–10.5)
nRBC: 0 % (ref 0.0–0.2)

## 2019-08-14 LAB — URINALYSIS, ROUTINE W REFLEX MICROSCOPIC
Bilirubin Urine: NEGATIVE
Glucose, UA: NEGATIVE mg/dL
Hgb urine dipstick: NEGATIVE
Ketones, ur: NEGATIVE mg/dL
Leukocytes,Ua: NEGATIVE
Nitrite: NEGATIVE
Protein, ur: NEGATIVE mg/dL
Specific Gravity, Urine: 1.02 (ref 1.005–1.030)
pH: 6 (ref 5.0–8.0)

## 2019-08-14 LAB — COMPREHENSIVE METABOLIC PANEL
ALT: 27 U/L (ref 0–44)
AST: 22 U/L (ref 15–41)
Albumin: 4.3 g/dL (ref 3.5–5.0)
Alkaline Phosphatase: 49 U/L (ref 38–126)
Anion gap: 8 (ref 5–15)
BUN: 13 mg/dL (ref 6–20)
CO2: 27 mmol/L (ref 22–32)
Calcium: 8.9 mg/dL (ref 8.9–10.3)
Chloride: 106 mmol/L (ref 98–111)
Creatinine, Ser: 0.69 mg/dL (ref 0.44–1.00)
GFR calc Af Amer: >60 mL/min (ref >60)
GFR calc non Af Amer: >60 mL/min (ref >60)
Glucose, Bld: 132 mg/dL — ABNORMAL HIGH (ref 70–99)
Potassium: 4 mmol/L (ref 3.5–5.1)
Sodium: 141 mmol/L (ref 135–145)
Total Bilirubin: 0.5 mg/dL (ref 0.3–1.2)
Total Protein: 7.3 g/dL (ref 6.5–8.1)

## 2019-08-14 LAB — I-STAT BETA HCG BLOOD, ED (MC, WL, AP ONLY): I-stat hCG, quantitative: <5 m[IU]/mL (ref <5)

## 2019-08-14 LAB — LIPASE, BLOOD: Lipase: 36 U/L (ref 11–51)

## 2019-08-14 IMAGING — CT CT ABD-PELV W/ CM
2 of 5 series · 17 of 46 positions shown, 19 images · IV contrast (omnipaque)
Comparison: None.

CLINICAL DATA: Postprandial left upper quadrant abdominal pain,
nausea

EXAM:
CT ABDOMEN AND PELVIS WITH CONTRAST
TECHNIQUE: Multidetector CT imaging of the abdomen and pelvis was performed
using the standard protocol following bolus administration of
intravenous contrast.
CONTRAST:  100mL OMNIPAQUE IOHEXOL 300 MG/ML  SOLN

[Series 2: axial st · axial · 0.82mm/px · z∈[+1072,+1508]mm · 14 of 99 slices shown, 16 images]
[im 6/99  soft-tissue]
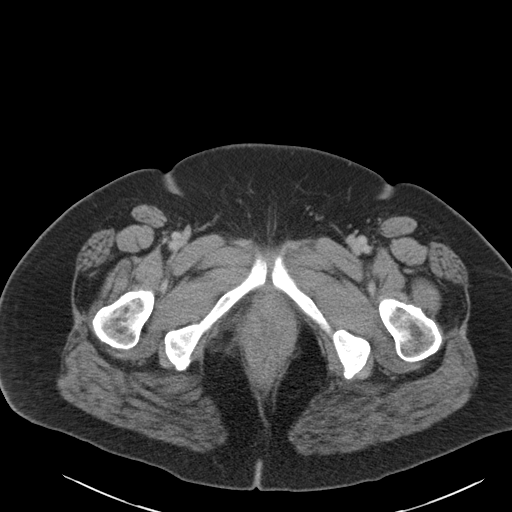
[im 6/99  bone]
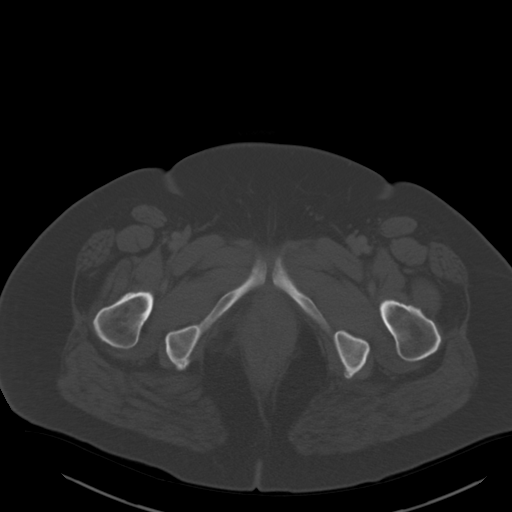
[im 12/99  soft-tissue]
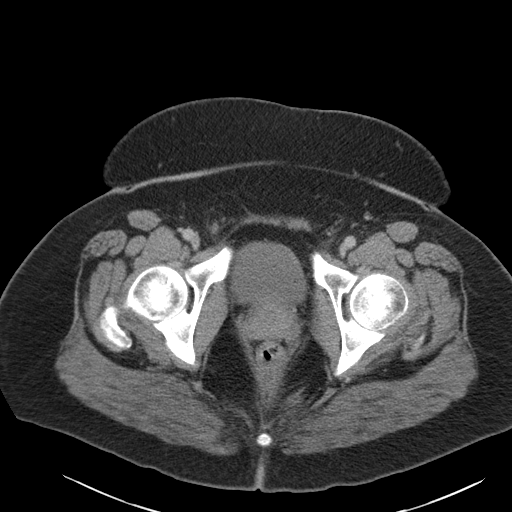
[im 18/99  soft-tissue]
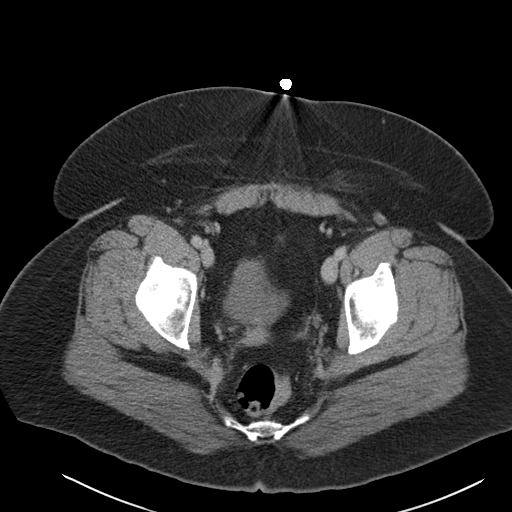
[im 29/99  soft-tissue]
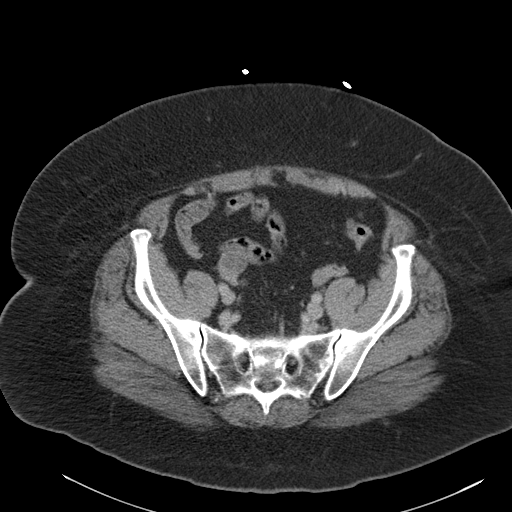
[im 35/99  soft-tissue]
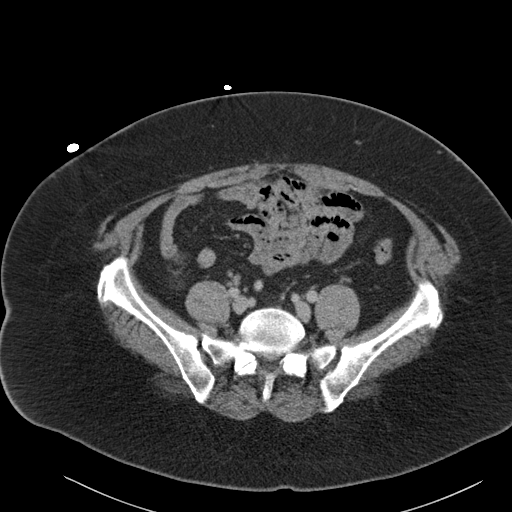
[im 41/99  soft-tissue]
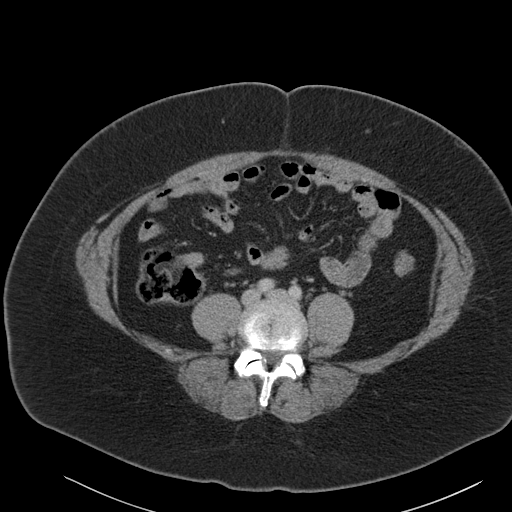
[im 47/99  soft-tissue]
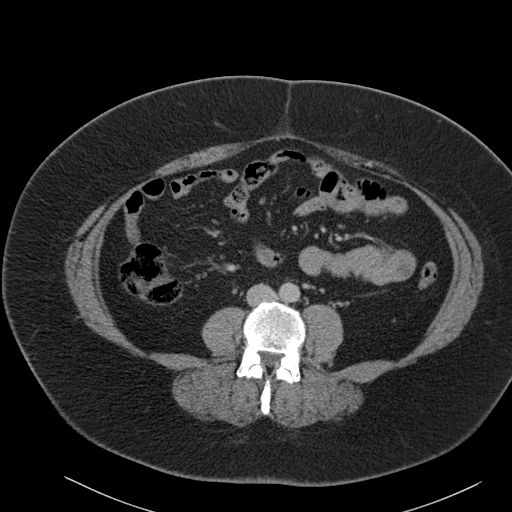
[im 52/99  soft-tissue]
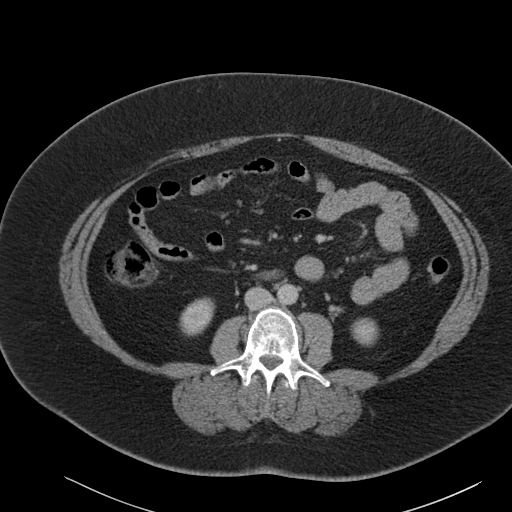
[im 58/99  soft-tissue]
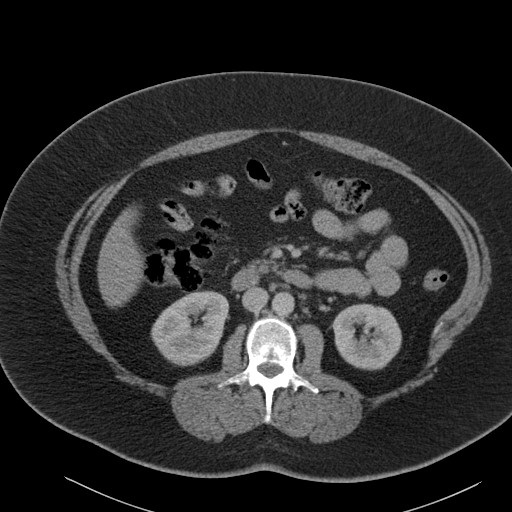
[im 58/99  bone]
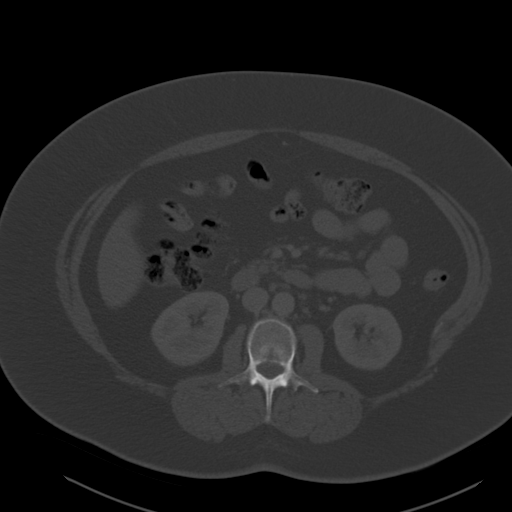
[im 64/99  soft-tissue]
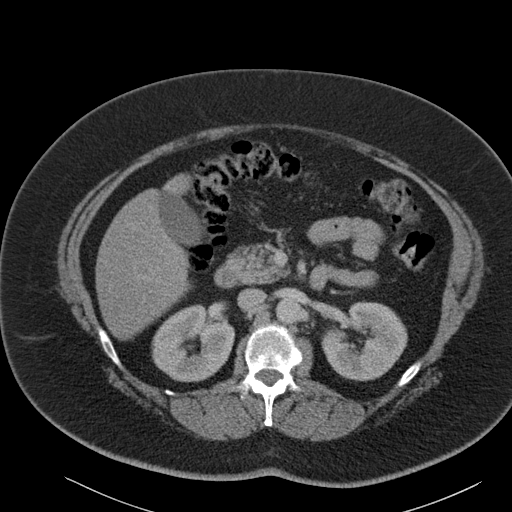
[im 75/99  soft-tissue]
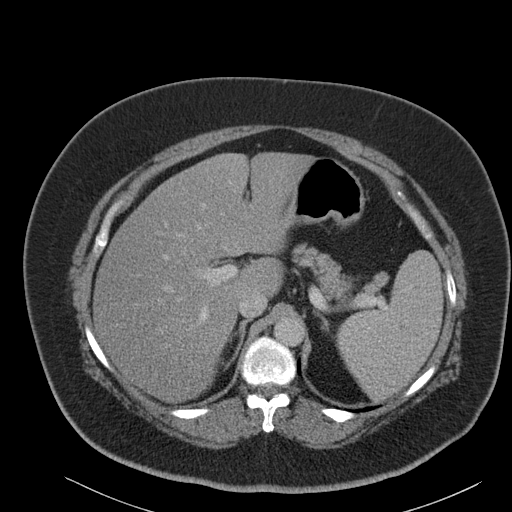
[im 81/99  soft-tissue]
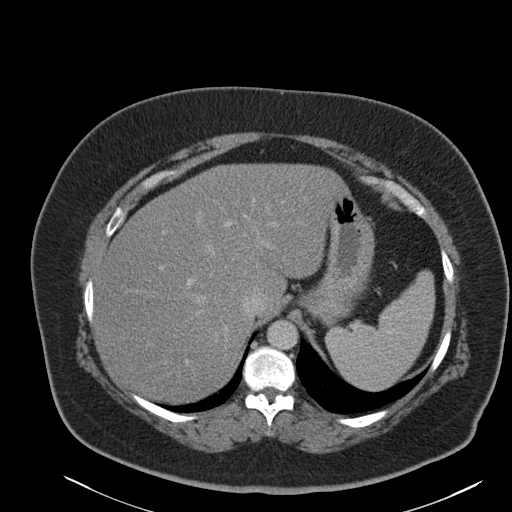
[im 87/99  soft-tissue]
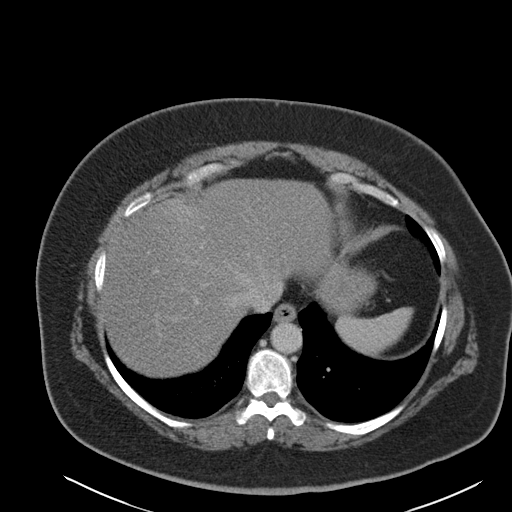
[im 93/99  soft-tissue]
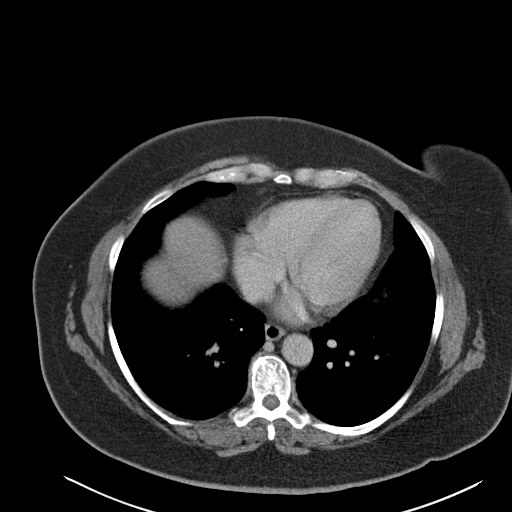

[Series 4: coronal st · coronal · 0.94mm/px · 3 of 162 slices shown]
[im 54/162  soft-tissue]
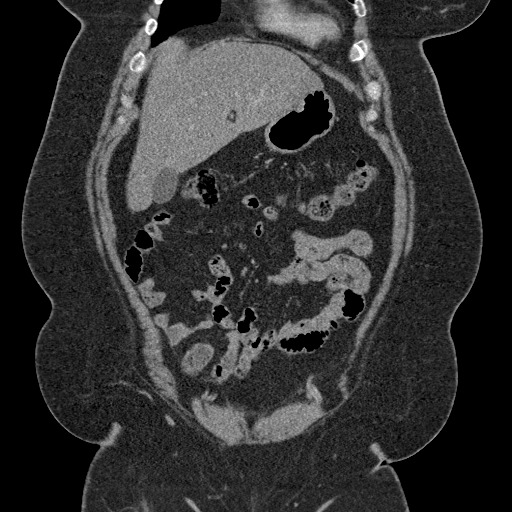
[im 72/162  soft-tissue]
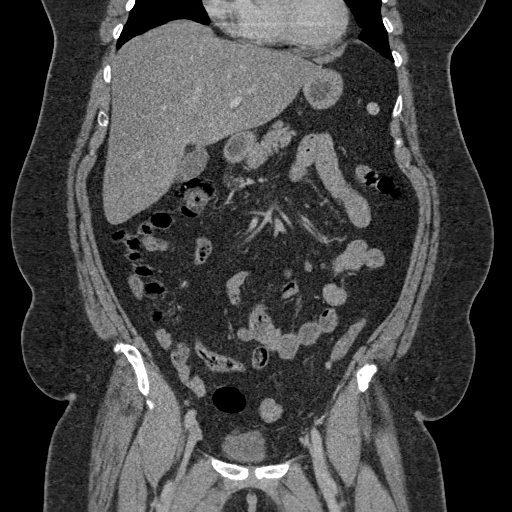
[im 90/162  soft-tissue]
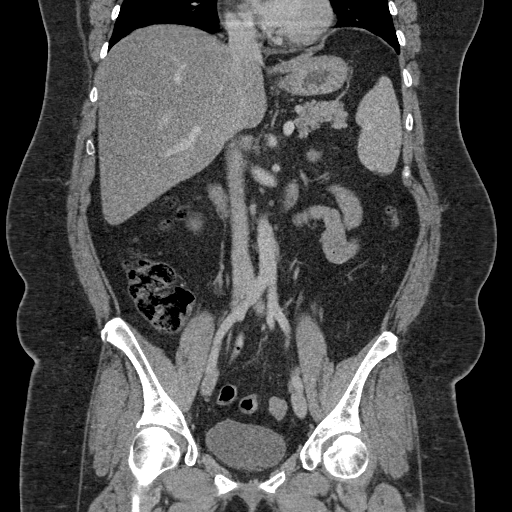

[17 of 46 positions shown; findings below may reference images not displayed]

FINDINGS: Lower chest: No acute pleural or parenchymal lung disease.

Hepatobiliary: There is mild diffuse hepatic steatosis. No focal
liver abnormalities. The gallbladder is unremarkable.

Pancreas: Unremarkable. No pancreatic ductal dilatation or
surrounding inflammatory changes.

Spleen: Normal in size without focal abnormality.

Adrenals/Urinary Tract: Adrenal glands are unremarkable. Kidneys are
normal, without renal calculi, focal lesion, or hydronephrosis.
Bladder is unremarkable.

Stomach/Bowel: No bowel obstruction or ileus. Scattered
diverticulosis of the descending and sigmoid colon is noted without
diverticulitis. Normal appendix right lower quadrant.

Vascular/Lymphatic: No significant vascular findings are present. No
enlarged abdominal or pelvic lymph nodes.

Reproductive: Prostate is unremarkable.

Other: No abdominal wall hernia or abnormality. No abdominopelvic
ascites.

Musculoskeletal: No acute or destructive bony lesions. Reconstructed
images demonstrate no additional findings.
IMPRESSION: 1. Mild diffuse hepatic steatosis.
2. Scattered diverticulosis of the descending and sigmoid colon
without diverticulitis.

## 2019-08-14 MED ORDER — SODIUM CHLORIDE 0.9% FLUSH
3.0000 mL | Freq: Once | INTRAVENOUS | Status: AC
  Administered 2019-08-14: 3 mL via INTRAVENOUS

## 2019-08-14 MED ORDER — SODIUM CHLORIDE (PF) 0.9 % IJ SOLN
INTRAMUSCULAR | Status: AC
  Filled 2019-08-14: qty 50

## 2019-08-14 MED ORDER — IOHEXOL 300 MG/ML  SOLN
100.0000 mL | Freq: Once | INTRAMUSCULAR | Status: AC | PRN
  Administered 2019-08-14: 100 mL via INTRAVENOUS

## 2019-08-14 NOTE — Telephone Encounter (Signed)
[facility] - Client TELEPHONE ADVICE RECORD [facility] Patient Name: Gail [name] Gender: Female DOB: 1971/01/15 Age: 49 Y 32 M 27 D Return Phone Number: [phone] (Primary) Address[place] [address] Client [facility] - Client Client Site [place] MD Contact Type Call Who Is Calling Patient / Member / Family / Caregiver Call Type Triage / Clinical Relationship To Patient Self Return Phone Number [phone] (Primary) Chief Complaint ABDOMINAL PAIN - Severe and only in abdomen Reason for Call Symptomatic / Request for [place] states she has sharp and dull pains in abdomin, concerned about gallbladder, and wondering what to do. Translation No Nurse Assessment Nurse: Raphael Gibney, RN, Vanita Ingles Date/Time (Eastern Time): 08/14/2019 8:27:02 AM Confirm and document reason for call. If symptomatic, describe symptoms. ---Caller states she has lower back pain with upper right abd pain. Pain started yesterday. Pain level 8. Has had severe abd pain since 7 am. Has the patient had close contact with a person known or suspected to have the novel coronavirus illness OR traveled / lives in area with major community spread (including international travel) in the last 14 days from the onset of symptoms? * If Asymptomatic, screen for exposure and travel within the last 14 days. ---No Does the patient have any new or worsening symptoms? ---Yes Will a triage be completed? ---Yes Related visit to physician within the last 2 weeks? ---No Does the PT have any chronic conditions? (i.e. diabetes, asthma, this includes High risk factors for pregnancy, etc.) ---No Is the patient pregnant or possibly pregnant? (Ask all females between the ages of 43-55) ---No Is this a behavioral health or substance abuse call? ---No Guidelines Guideline  Title Affirmed Question Affirmed Notes Nurse Date/Time Eilene Ghazi Time) Abdominal Pain - Upper [1] SEVERE pain (e.g., excruciating) AND [2] present > 1 hour Raphael Gibney, RN, Vanita Ingles 08/14/2019 8:29:06 AM PLEASE NOTE: All timestamps contained within this report are represented as [place] Standard Time. CONFIDENTIALTY NOTICE: This fax transmission is intended only for the addressee. It contains information that is legally privileged, confidential or otherwise protected from use or disclosure. If you are not the intended recipient, you are strictly prohibited from reviewing, disclosing, copying using or disseminating any of this information or taking any action in reliance on or regarding this information. If you have received this fax in error, please notify us immediately by telephone so that we can arrange for its return to [place]. Phone: [phone], Toll-Free: [phone], Fax: [phone] Page: 2 of 2 Call Id: [id number] [place]. Time Eilene Ghazi Time) Disposition Final User 08/14/2019 8:25:59 AM Send to Urgent Queue Rosana Fret 08/14/2019 8:30:59 AM Go to ED Now Yes Raphael Gibney, RN, Doreatha Lew Disagree/Comply Comply Caller Understands Yes PreDisposition Did not know what to do Care Advice Given Per Guideline GO TO ED NOW: * You need to be seen in the Emergency Department. DRIVING: * Another adult should drive. NOTHING BY MOUTH: * Do not eat or drink anything for now. * Reason: condition may need surgery and general anesthesia. CARE ADVICE given per Abdominal Pain, Upper (Adult) guideline. Referrals GO TO FACILITY UNDECIDED

## 2019-08-14 NOTE — Telephone Encounter (Signed)
Per chart review tab pt is at [facility] ED now.

## 2019-08-14 NOTE — ED Triage Notes (Signed)
Pt developed LUQ pain yesterday after eating, pain continues as well as nausea. No emesis or diarrhea.

## 2019-08-14 NOTE — Telephone Encounter (Signed)
Aware, will watch for correspondence  

## 2019-08-15 ENCOUNTER — Telehealth: Payer: Self-pay

## 2019-08-15 MED ORDER — OMEPRAZOLE 20 MG PO CPDR
20.0000 mg | DELAYED_RELEASE_CAPSULE | Freq: Every day | ORAL | 0 refills | Status: DC
Start: 2019-08-15 — End: 2019-11-04

## 2019-08-15 NOTE — Telephone Encounter (Signed)
[facility] - Client TELEPHONE ADVICE RECORD [facility] Patient Name: Gail [name] Gender: Female DOB: 10/10/1970 Age: 49 Y 22 M 27 D Return Phone Number: [phone] (Primary) Address: [name] [address] Client [facility] Night - Client Client Site [place] Physician Tower, Roque Lias - MD Contact Type Call Who Is Calling Patient / Member / Family / Caregiver Call Type Triage / Clinical Relationship To Patient Self Return Phone Number [phone] (Primary) Chief Complaint ABDOMINAL PAIN - Severe and only in abdomen Reason for Call Symptomatic / Request for [place] states she was sent to the ER and is still waiting at the ER to be seen but has been waiting for 9 hours. Caller states she was told to go in for her gallbladder and states she is having sever pains in her abdomen but is unsure if she needs to stay to be seen since it's already been awhile. Translation No Nurse Assessment Nurse: Rayburn Felt, RN, Colletta [place] Date/Time (Eastern Time): 08/14/2019 7:09:18 PM Confirm and document reason for call. If symptomatic, describe symptoms. ---Caller states she is currently in the ER. Caller stated that she has been there for 9 hours and has not been seen they have only done blood work. Caller stated that she is having abdominal pain in the middle of her abdomen above her umbilicus. Pain 2/10 currently but a 10 "when I eat". No fever. Nauseated. Caller denies any other symptoms. Has the patient had close contact with a person known or suspected to have the novel coronavirus illness OR traveled / lives in area with major community spread (including international travel) in the last 14 days from the onset of symptoms? * If Asymptomatic, screen for exposure and travel within the last 14 days. ---No Does the patient have any new or worsening symptoms?  ---Yes Will a triage be completed? ---Yes Related visit to physician within the last 2 weeks? ---No Does the PT have any chronic conditions? (i.e. diabetes, asthma, this includes High risk factors for pregnancy, etc.) ---Yes List chronic conditions. ---Hypoglycemia Is the patient pregnant or possibly pregnant? (Ask all females between the ages of 1-55) ---No Is this a behavioral health or substance abuse call? ---No PLEASE NOTE: All timestamps contained within this report are represented as [place] Standard Time. CONFIDENTIALTY NOTICE: This fax transmission is intended only for the addressee. It contains information that is legally privileged, confidential or otherwise protected from use or disclosure. If you are not the intended recipient, you are strictly prohibited from reviewing, disclosing, copying using or disseminating any of this information or taking any action in reliance on or regarding this information. If you have received this fax in error, please notify us immediately by telephone so that we can arrange for its return to [place]. Phone: [phone], Toll-Free: [phone], Fax: [phone] Page: 2 of 2 Call Id: [id number] Guidelines Guideline Title Affirmed Question Affirmed Notes Nurse Date/Time Eilene Ghazi Time) Abdominal Pain - Upper [1] MILD-MODERATE pain AND [2] constant AND [3] present > 2 hours [place], RN, Colletta [place] 08/14/2019 7:13:09 PM Disp. Time Eilene Ghazi Time) Disposition Final User 08/14/2019 7:07:36 PM Send to Urgent [place] Patience, Sadie 08/14/2019 7:16:10 PM See HCP within 4 Hours (or PCP triage) Yes Hembree, RN, Ellouise Newer Disagree/Comply Comply Caller Understands Yes PreDisposition Go to ED Care Advice Given Per Guideline SEE HCP WITHIN 4 HOURS (OR PCP TRIAGE): * IF OFFICE WILL BE OPEN: You need to be seen within the next  3 or 4 hours. Call your doctor (or NP/PA) now or as soon as the office opens. CALL BACK IF: * You become worse. CARE ADVICE given  per Abdominal Pain, Upper (Adult) guideline. Comments User: Elmer Picker, RN Date/Time Eilene Ghazi Time): 08/14/2019 7:18:30 PM Caller stated that she is already at the hospital and has been there for 9 hours and was calling to see if she should stay to be seen. (Disposition be seen in 4 hours) Caller encouraged to stay and be evaluated. Caller verbalized understanding.

## 2019-08-15 NOTE — Discharge Instructions (Addendum)
Make an appointment to follow-up with gastroenterology.  As we discussed this could be representative of a stomach ulcer or a dysfunctional gallbladder.  CT scan done tonight without any acute findings.  No evidence of any gallstones.  Try bland diet nonfatty diet.  Take the Prilosec as directed for the next 2 weeks.  Return for any new or worse symptoms.

## 2019-08-15 NOTE — Telephone Encounter (Signed)
Per chart review tab pt was seen at [facility] ED.

## 2019-08-15 NOTE — ED Provider Notes (Signed)
[facility] DEPT Provider Note   CSN: [id number] Arrival date & time: 08/14/19  [id number]     History Chief Complaint  Patient presents with  . Abdominal Pain    Gail [name] is a 49 y.o. female.  Patient with the onset of epigastric abdominal pain that started at 12 noon yesterday.  Pain is with eating seems to start right away.  Associated with nausea but no vomiting.  No prior history of any similar problem.  No diarrhea.  No fevers.  No back pain.        Past Medical History:  Diagnosis Date  . Anemia   . Anxiety   . Depression   . Diabetes mellitus without complication ([place])    type 2  . Dyspnea   . Hearing loss    wears hearing aids bilateral  . History of allergic rhinitis   . History of anxiety   . History of blood transfusion    [facility] 09/2016 2 units transfused  . History of depression   . History of hyperlipidemia   . History of syncope   . Seizures ([place])    hx seizures as a child,no seizures since age 36 yrs old  . Sleep apnea    uses mouth guard at night    Patient Active Problem List   Diagnosis Date Noted  . Screening mammogram, encounter for 10/22/2018  . PCOS (polycystic ovarian syndrome) 09/26/2017  . Status post total abdominal hysterectomy 12/19/2016  . Insomnia 07/22/2016  . Auditory processing disorder 04/04/2016  . Encounter for routine gynecological examination 11/27/2014  . Morbid obesity ([place]) 12/13/2012  . Prediabetes 12/13/2012  . Routine general medical examination at a health care facility 12/04/2011  . HYPERTRIGLYCERIDEMIA 04/07/2008  . PROBLEMS WITH HEARING 04/07/2008  . Anxiety disorder 02/07/2008  . ALLERGIC RHINITIS 02/07/2008  . HYPOGLYCEMIA, REACTIVE 11/15/2006    Past Surgical History:  Procedure Laterality Date  . [place]   x 2  . CYSTOSCOPY N/A 12/19/2016   Procedure: CYSTOSCOPY;  Surgeon: Nunzio Cobbs, MD;  Location: [facility];  Service: Gynecology;   Laterality: N/A;  . TOTAL LAPAROSCOPIC HYSTERECTOMY WITH SALPINGECTOMY Bilateral 12/19/2016   Procedure: HYSTERECTOMY TOTAL LAPAROSCOPIC WITH SALPINGECTOMY;  Surgeon: Nunzio Cobbs, MD;  Location: [facility];  Service: Gynecology;  Laterality: Bilateral;  . WISDOM TOOTH EXTRACTION       OB History    Gravida  2   Para  2   Term  0   Preterm  0   AB  0   Living  2     SAB  0   TAB  0   Ectopic  0   Multiple  0   Live Births  0           Family History  Problem Relation Age of Onset  . Diabetes Father   . Diabetes Paternal Grandfather   . Prostate cancer Paternal Grandfather   . Diabetes Paternal Grandmother     Social History   Tobacco Use  . Smoking status: Never Smoker  . Smokeless tobacco: Never Used  Substance Use Topics  . Alcohol use: Yes    Alcohol/week: 0.0 standard drinks    Comment: occasional  winie  . Drug use: No    Home Medications Prior to Admission medications   Medication Sig Start Date End Date Taking? Authorizing Provider  atorvastatin (LIPITOR) 20 MG tablet Take 1 tablet (20 mg total) by  mouth at bedtime. 10/22/18  Yes Tower, Wynelle Fanny, MD  escitalopram (LEXAPRO) 10 MG tablet TAKE 1 TABLET BY MOUTH EVERY DAY 08/13/19  Yes Tower, Marne A, MD  miglitol (GLYSET) 25 MG tablet TAKE 1 TABLET BY MOUTH TWICE A DAY Patient taking differently: Take 25 mg by mouth daily.  07/08/19  Yes Tower, Wynelle Fanny, MD  Choline Fenofibrate (FENOFIBRIC ACID) 135 MG CPDR Take 135 mg by mouth at bedtime. Patient not taking: Reported on 08/14/2019 10/22/18   Tower, Wynelle Fanny, MD  guaiFENesin-codeine [facility]) 100-10 MG/5ML syrup Take 5 mLs by mouth 2 (two) times daily as needed. Patient not taking: Reported on 08/14/2019 03/13/19   Ria Bush, MD  omeprazole (PRILOSEC) 20 MG capsule Take 1 capsule (20 mg total) by mouth daily for 14 days. 08/15/19 08/29/19  Fredia Sorrow, MD    Allergies    Influenza vaccine recombinant and Penicillins  Review of  Systems   Review of Systems  Constitutional: Negative for chills and fever.  HENT: Negative for rhinorrhea and sore throat.   Eyes: Negative for visual disturbance.  Respiratory: Negative for cough and shortness of breath.   Cardiovascular: Negative for chest pain and leg swelling.  Gastrointestinal: Positive for abdominal pain and nausea. Negative for diarrhea and vomiting.  Genitourinary: Negative for dysuria.  Musculoskeletal: Negative for back pain and neck pain.  Skin: Negative for rash.  Neurological: Negative for dizziness, light-headedness and headaches.  Hematological: Does not bruise/bleed easily.  Psychiatric/Behavioral: Negative for confusion.    Physical Exam Updated Vital Signs BP (!) 125/94 (BP Location: Left Arm)   Pulse 72   Temp 98.1 F (36.7 C) (Oral)   Resp 16   Ht 1.626 m (5\' 4" )   Wt 104.3 kg   LMP 12/19/2016 (Exact Date)   SpO2 98%   BMI 39.48 kg/m   Physical Exam Vitals and nursing note reviewed.  Constitutional:      General: She is not in acute distress.    Appearance: She is well-developed.  HENT:     Head: Normocephalic and atraumatic.  Eyes:     Extraocular Movements: Extraocular movements intact.     Conjunctiva/sclera: Conjunctivae normal.     Pupils: Pupils are equal, round, and reactive to light.  Cardiovascular:     Rate and Rhythm: Normal rate and regular rhythm.     Heart sounds: No murmur.  Pulmonary:     Effort: Pulmonary effort is normal. No respiratory distress.     Breath sounds: Normal breath sounds.  Abdominal:     General: There is no distension.     Palpations: Abdomen is soft.     Tenderness: There is no abdominal tenderness. There is no guarding.  Musculoskeletal:     Cervical back: Normal range of motion and neck supple.  Skin:    General: Skin is warm and dry.  Neurological:     General: No focal deficit present.     Mental Status: She is alert and oriented to person, place, and time.     ED Results /  Procedures / Treatments   Labs (all labs ordered are listed, but only abnormal results are displayed) Labs Reviewed  COMPREHENSIVE METABOLIC PANEL - Abnormal; Notable for the following components:      Result Value   Glucose, Bld 132 (*)    All other components within normal limits  LIPASE, BLOOD  CBC  URINALYSIS, ROUTINE W REFLEX MICROSCOPIC  I-STAT BETA HCG BLOOD, ED ([facility], [facility], [facility] ONLY)  EKG None  Radiology CT Abdomen Pelvis W Contrast  Result Date: 08/14/2019 CLINICAL DATA:  Postprandial left upper quadrant abdominal pain, nausea EXAM: CT ABDOMEN AND PELVIS WITH CONTRAST TECHNIQUE: Multidetector CT imaging of the abdomen and pelvis was performed using the standard protocol following bolus administration of intravenous contrast. CONTRAST:  139mL OMNIPAQUE IOHEXOL 300 MG/ML  SOLN COMPARISON:  None. FINDINGS: Lower chest: No acute pleural or parenchymal lung disease. Hepatobiliary: There is mild diffuse hepatic steatosis. No focal liver abnormalities. The gallbladder is unremarkable. Pancreas: Unremarkable. No pancreatic ductal dilatation or surrounding inflammatory changes. Spleen: Normal in size without focal abnormality. Adrenals/Urinary Tract: Adrenal glands are unremarkable. Kidneys are normal, without renal calculi, focal lesion, or hydronephrosis. Bladder is unremarkable. Stomach/Bowel: No bowel obstruction or ileus. Scattered diverticulosis of the descending and sigmoid colon is noted without diverticulitis. Normal appendix right lower quadrant. Vascular/Lymphatic: No significant vascular findings are present. No enlarged abdominal or pelvic lymph nodes. Reproductive: Prostate is unremarkable. Other: No abdominal wall hernia or abnormality. No abdominopelvic ascites. Musculoskeletal: No acute or destructive bony lesions. Reconstructed images demonstrate no additional findings. IMPRESSION: 1. Mild diffuse hepatic steatosis. 2. Scattered diverticulosis of the descending and sigmoid colon  without diverticulitis. Electronically Signed   By: Randa Ngo M.D.   On: 08/14/2019 21:39    Procedures Procedures (including critical care time)  Medications Ordered in ED Medications  sodium chloride flush (NS) 0.9 % injection 3 mL (3 mLs Intravenous Given 08/14/19 2044)  sodium chloride (PF) 0.9 % injection (  Given by Other 08/14/19 2134)  iohexol (OMNIPAQUE) 300 MG/ML solution 100 mL (100 mLs Intravenous Contrast Given 08/14/19 2122)    ED Course  I have reviewed the triage vital signs and the nursing notes.  Pertinent labs & imaging results that were available during my care of the patient were reviewed by me and considered in my medical decision making (see chart for details).    MDM Rules/Calculators/A&P                      CT scan of the abdomen without any acute findings.  Particular no evidence of any gallstones.  Patient's labs without any significant abnormality.  Patient's epigastric abdominal pain not felt to be cardiac in nature.  Definitely seems to be more food related.  EKG without acute changes.  Epigastric pain could be explained by either peptic ulcer disease.  Are dysfunctional gallbladder.  Will start patient on Prilosec 10 follow-up with GI medicine.  Also recommend bland nonfatty diet. Final Clinical Impression(s) / ED Diagnoses Final diagnoses:  Epigastric pain    Rx / DC Orders ED Discharge Orders         Ordered    omeprazole (PRILOSEC) 20 MG capsule  Daily     08/15/19 0028           Fredia Sorrow, MD 08/15/19 [phone]

## 2019-08-25 ENCOUNTER — Encounter: Payer: Self-pay | Admitting: Nurse Practitioner

## 2019-08-26 ENCOUNTER — Telehealth: Payer: Self-pay | Admitting: Family Medicine

## 2019-08-26 DIAGNOSIS — R1013 Epigastric pain: Secondary | ICD-10-CM | POA: Insufficient documentation

## 2019-08-26 NOTE — Telephone Encounter (Signed)
Ref done  Will route to [facility] 

## 2019-08-26 NOTE — Telephone Encounter (Signed)
Patient was seen at [facility] ER for abdominal pain. Patient was referred to GI by the ER and when [facility] called to schedule an appointment it was going to be 2 months.  Patient wants to know if she can be seen sooner at another GI office.  Patient will go anywhere as long as the appointment is sooner.

## 2019-09-09 ENCOUNTER — Ambulatory Visit
Admission: RE | Admit: 2019-09-09 | Discharge: 2019-09-09 | Disposition: A | Discharge status: Home / Self Care | Payer: BC Managed Care – PPO | Source: Ambulatory Visit | Attending: Family Medicine | Admitting: Family Medicine

## 2019-09-09 ENCOUNTER — Other Ambulatory Visit: Payer: Self-pay

## 2019-09-09 DIAGNOSIS — Z1231 Encounter for screening mammogram for malignant neoplasm of breast: Secondary | ICD-10-CM | POA: Diagnosis not present

## 2019-09-09 IMAGING — MG DIGITAL SCREENING BILAT W/ TOMO W/ CAD
6 of 10 series · 6 of 30 positions shown · non-contrast
Comparison: Previous exam(s).

CLINICAL DATA: Screening.

EXAM:
DIGITAL SCREENING BILATERAL MAMMOGRAM WITH TOMO AND CAD

[L CC synth-2D]
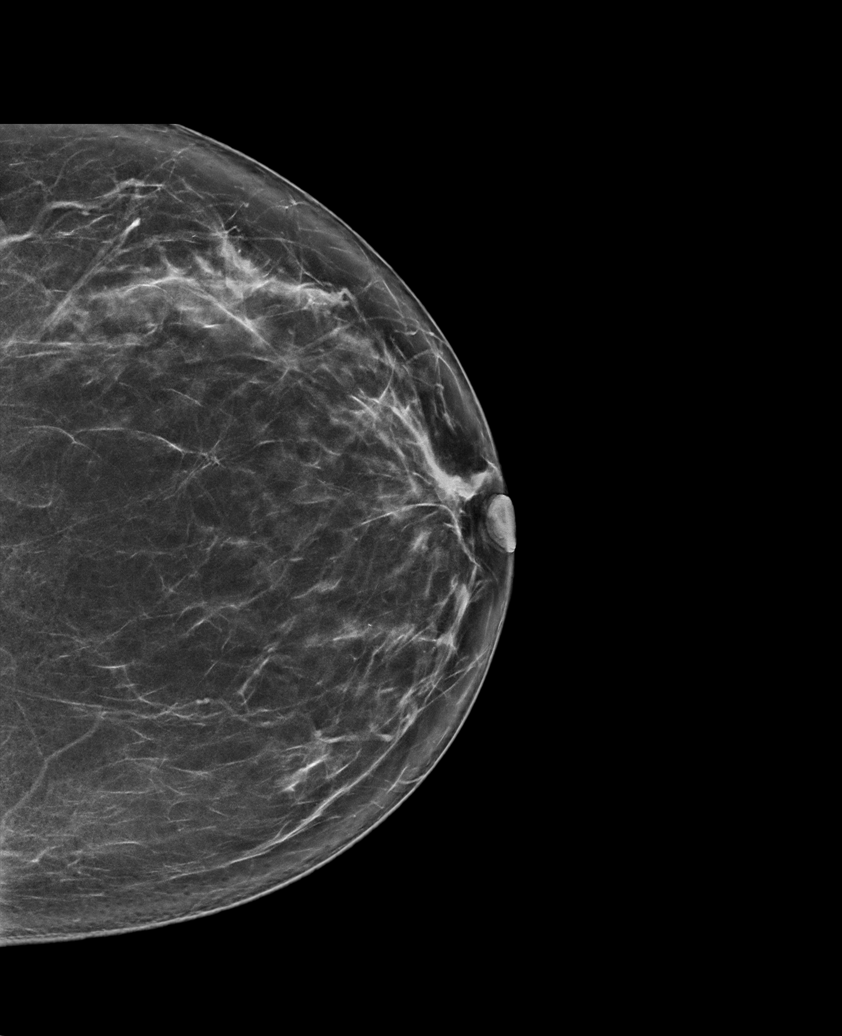

[L MLO synth-2D]
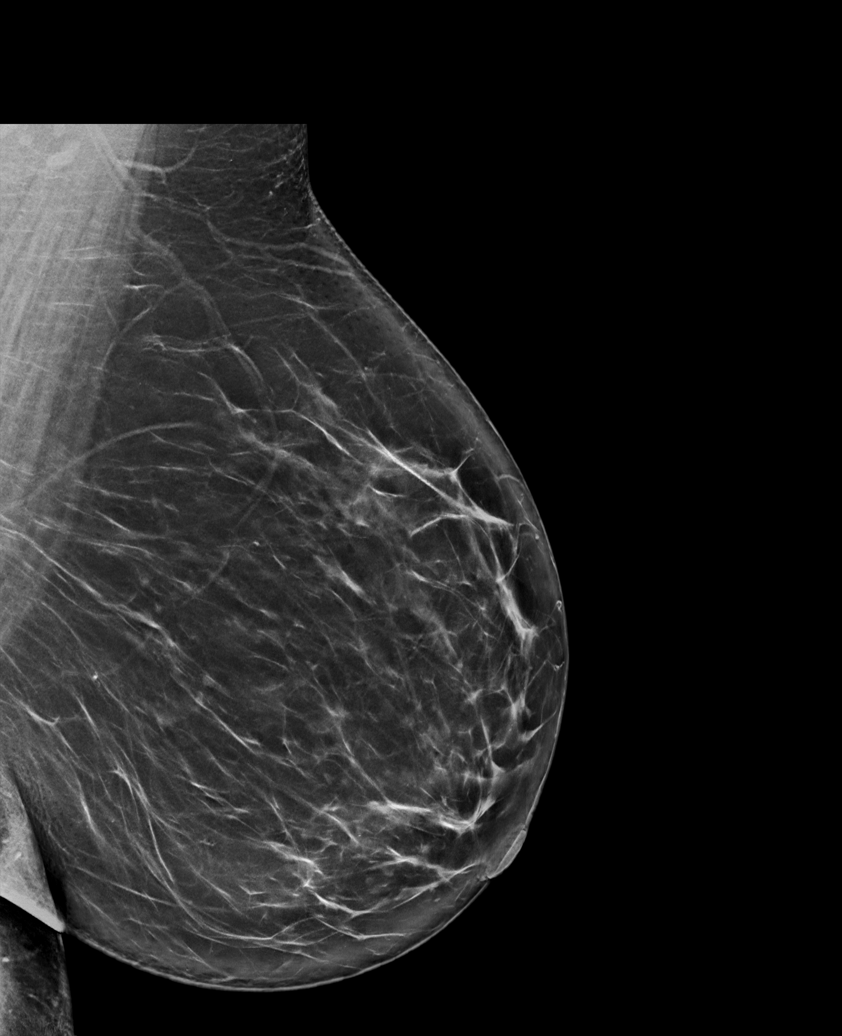

[R MLO synth-2D (1 of 2)]
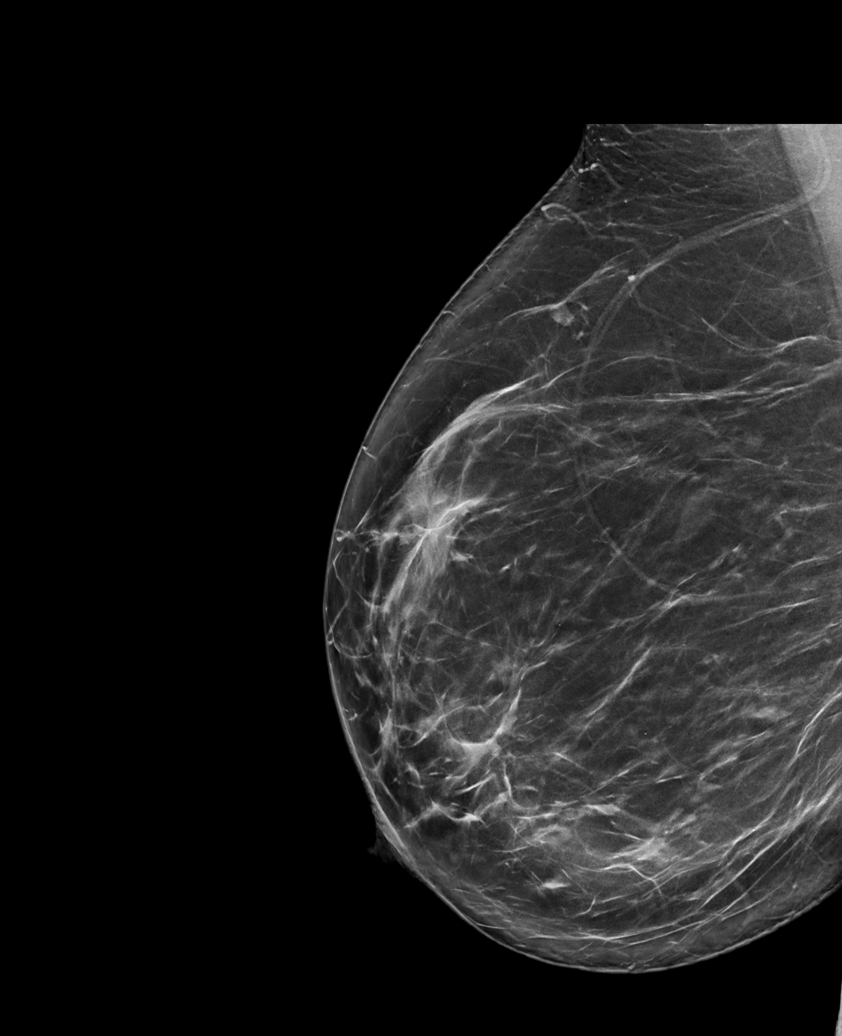

[R CC synth-2D]
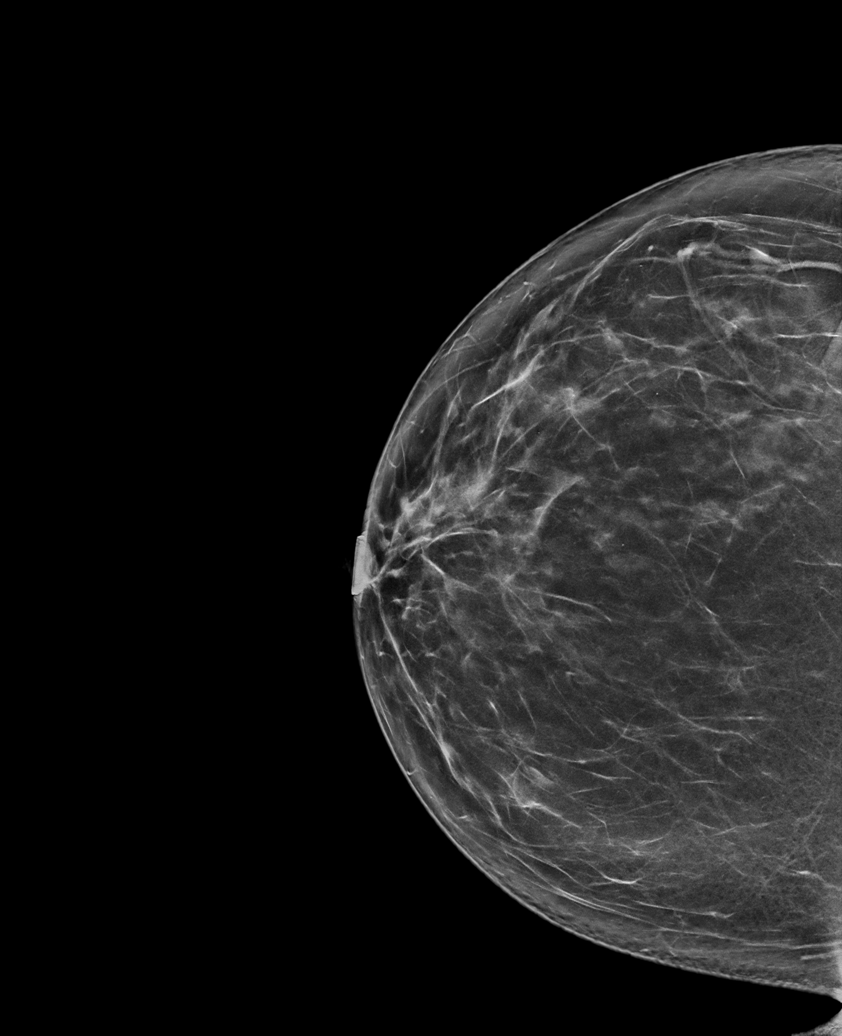

[R MLO synth-2D (2 of 2)]
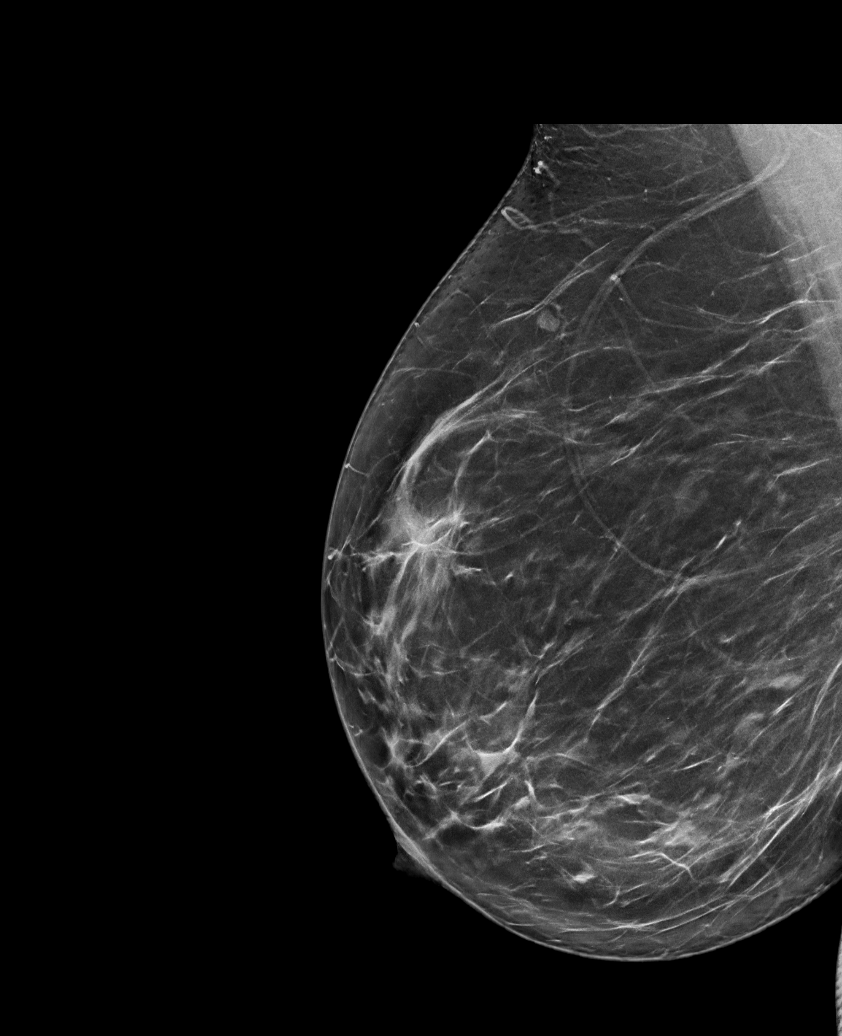

[R MLO tomo · tomo slice 44/87.0]
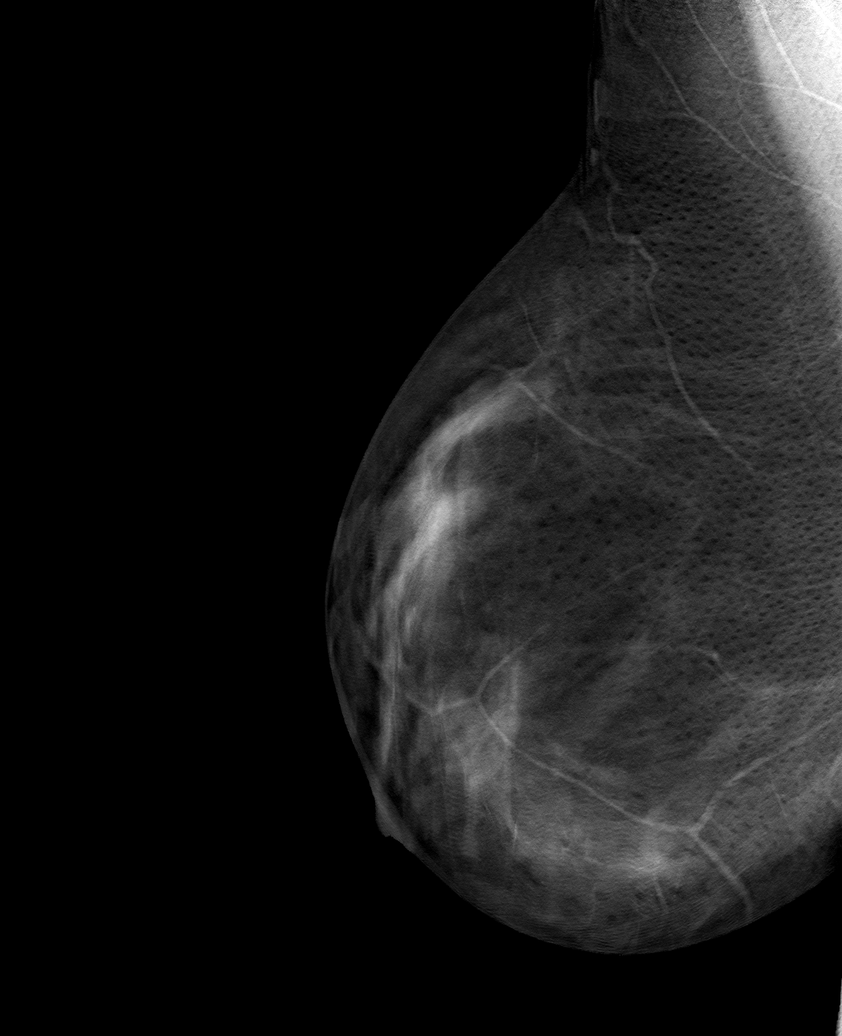

[6 of 30 positions shown; findings below may reference images not displayed]

ACR Breast Density Category b: There are scattered areas of
fibroglandular density.
FINDINGS: There are no findings suspicious for malignancy. Images were
processed with CAD.
IMPRESSION: No mammographic evidence of malignancy. A result letter of this
screening mammogram will be mailed directly to the patient.

RECOMMENDATION:
Screening mammogram in one year. (Code:[TQ])

BI-RADS CATEGORY  1: Negative.

## 2019-09-12 ENCOUNTER — Telehealth: Payer: Self-pay

## 2019-09-12 MED ORDER — ONETOUCH VERIO VI STRP: ORAL_STRIP | 0 refills | Status: DC

## 2019-09-12 NOTE — Telephone Encounter (Signed)
Pt is asking for a rx for One Touch Verio test strips for hypoglycemia. She says Dr Glori Bickers did the rx for her. They are not on her med list. If able, please send to [facility]. She is going out of town tomorrow. Asking if this can be done with Dr Glori Bickers out of the office. Please advise [phone]

## 2019-09-12 NOTE — Telephone Encounter (Signed)
Rx filled once and pt notified

## 2019-09-17 ENCOUNTER — Ambulatory Visit: Payer: BC Managed Care – PPO | Admitting: Nurse Practitioner

## 2019-10-01 ENCOUNTER — Other Ambulatory Visit: Payer: Self-pay | Admitting: Gastroenterology

## 2019-10-01 DIAGNOSIS — R1013 Epigastric pain: Secondary | ICD-10-CM | POA: Diagnosis not present

## 2019-10-01 DIAGNOSIS — Z01818 Encounter for other preprocedural examination: Secondary | ICD-10-CM | POA: Diagnosis not present

## 2019-10-05 ENCOUNTER — Other Ambulatory Visit: Payer: Self-pay | Admitting: Family Medicine

## 2019-10-13 ENCOUNTER — Ambulatory Visit
Admission: RE | Admit: 2019-10-13 | Discharge: 2019-10-13 | Disposition: A | Discharge status: Home / Self Care | Payer: BC Managed Care – PPO | Source: Ambulatory Visit | Attending: Gastroenterology | Admitting: Gastroenterology

## 2019-10-13 DIAGNOSIS — K7689 Other specified diseases of liver: Secondary | ICD-10-CM | POA: Diagnosis not present

## 2019-10-13 DIAGNOSIS — R1013 Epigastric pain: Secondary | ICD-10-CM

## 2019-10-13 IMAGING — US US ABDOMEN LIMITED
1 series · 14 of 25 positions shown · non-contrast
Comparison: CT abdomen/pelvis [DATE].

CLINICAL DATA: Epigastric pain. Additional history provided by
scanning technologist: Patient reports epigastric pain for 4 months
after eating

EXAM:
ULTRASOUND ABDOMEN LIMITED RIGHT UPPER QUADRANT

[Series 1: us abdomen limited · 0.28mm/px · 14 of 45 slices shown]
[im 1/45]
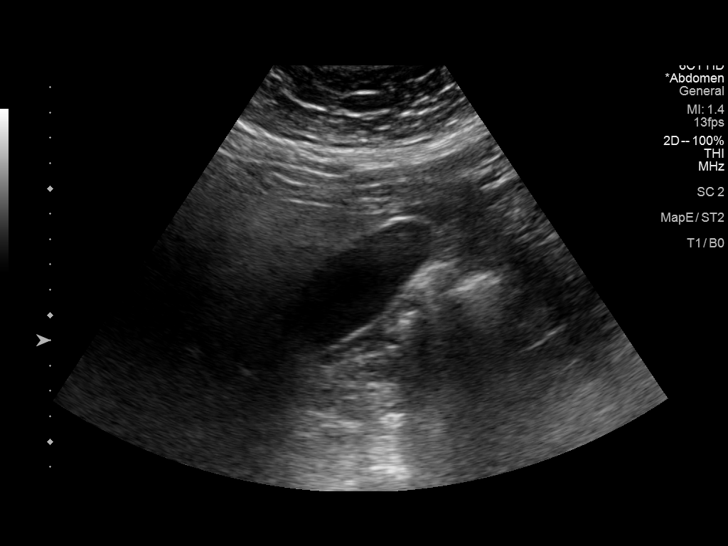
[im 4/45]
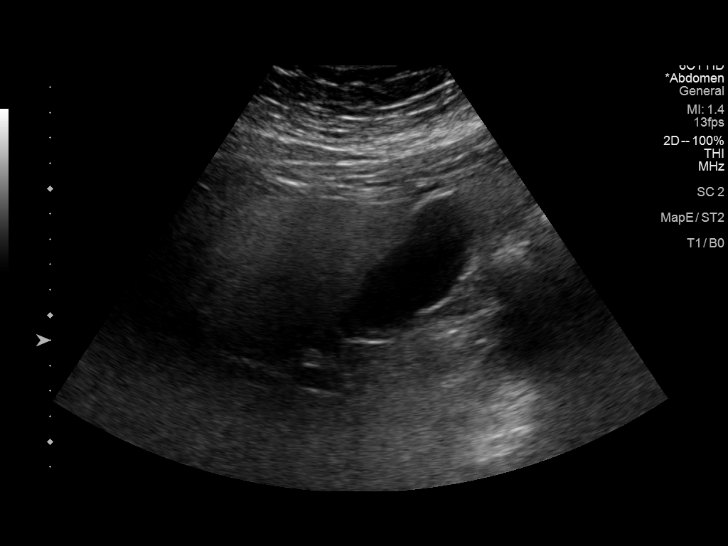
[im 8/45]
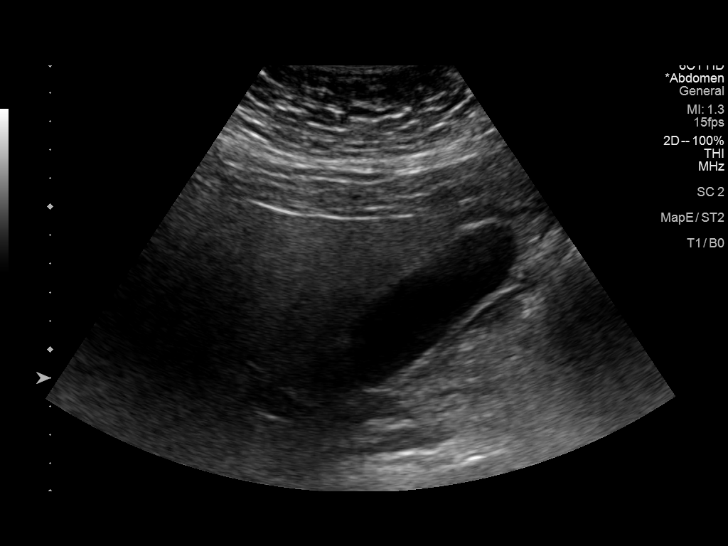
[im 12/45]
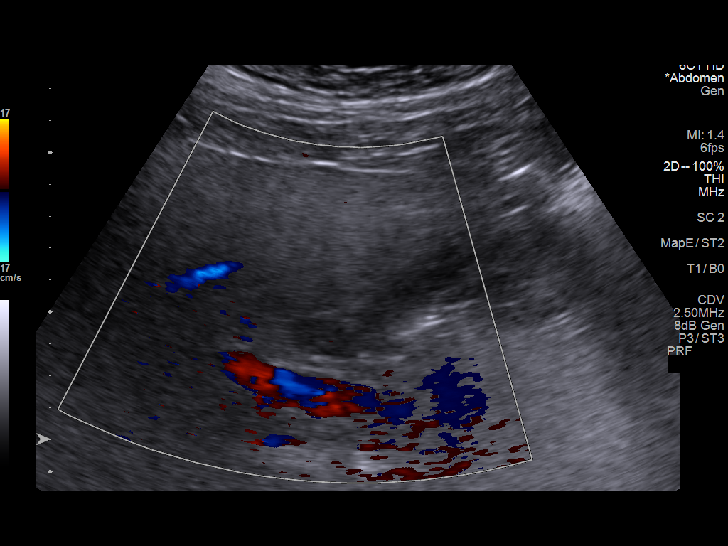
[im 15/45]
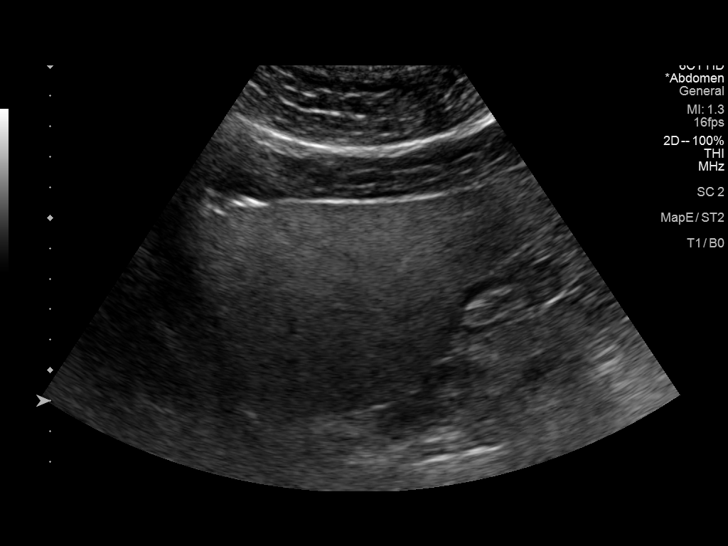
[im 17/45]
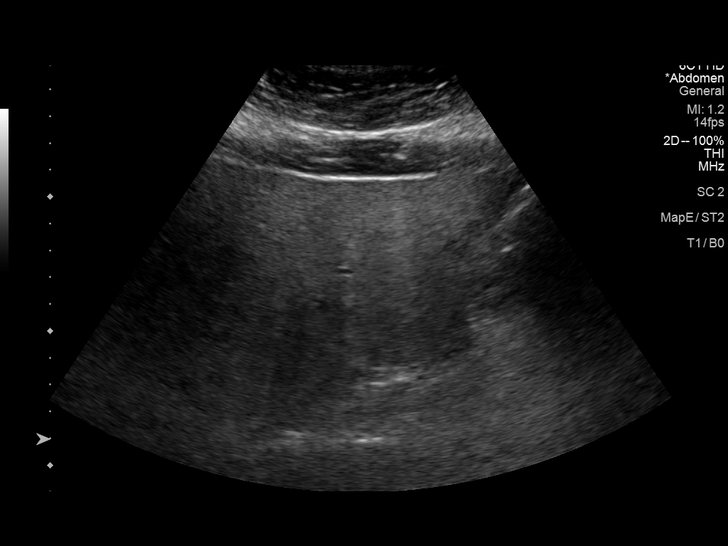
[im 21/45]
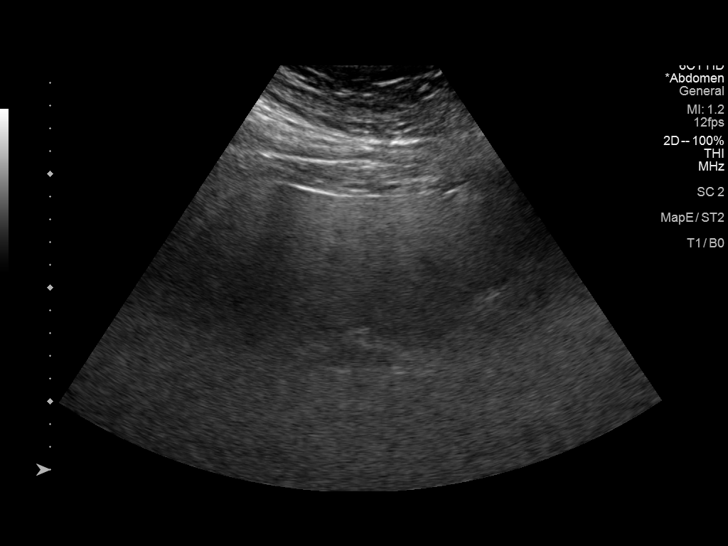
[im 24/45]
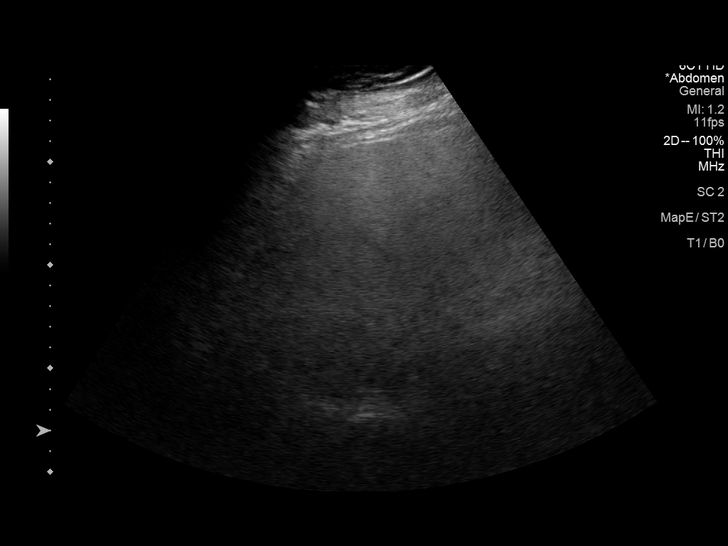
[im 28/45]
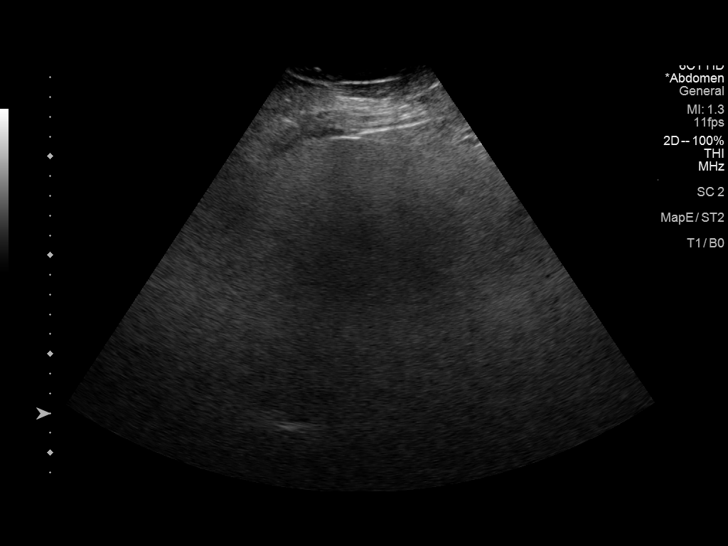
[im 30/45]
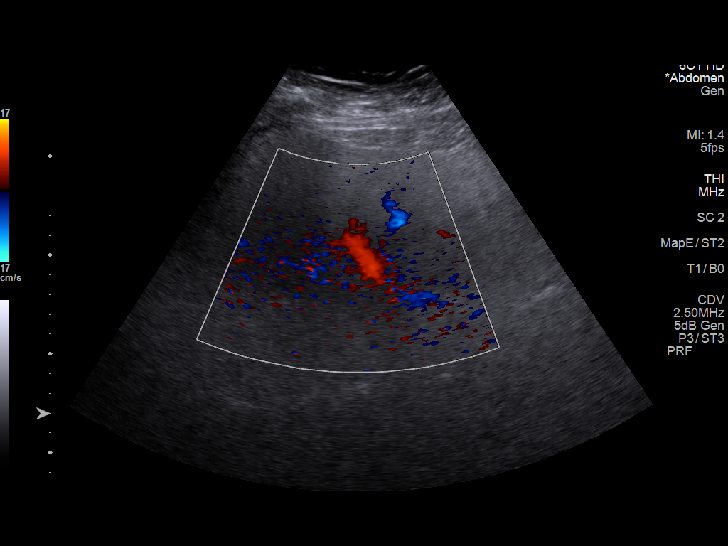
[im 34/45]
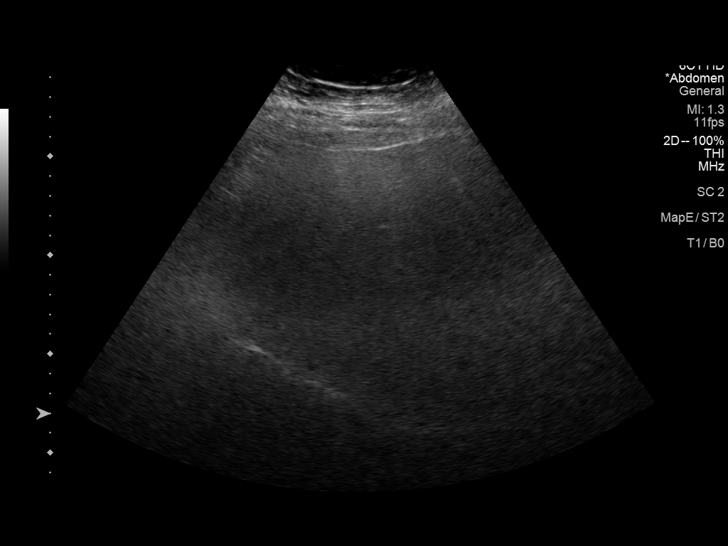
[im 37/45]
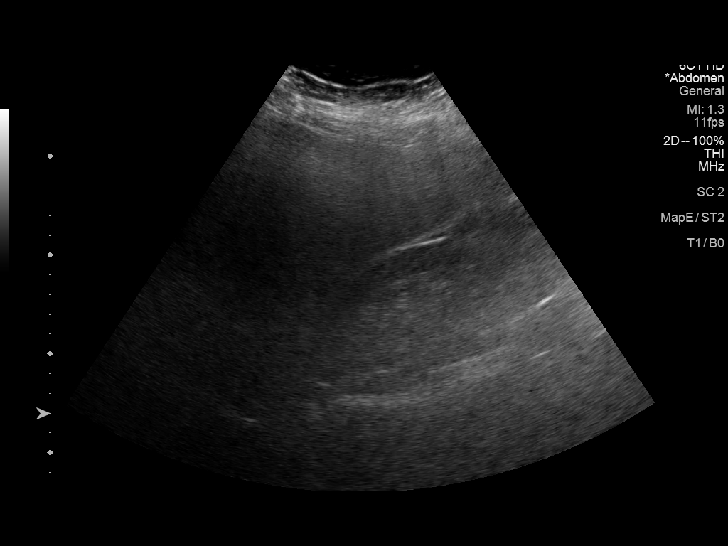
[im 41/45]
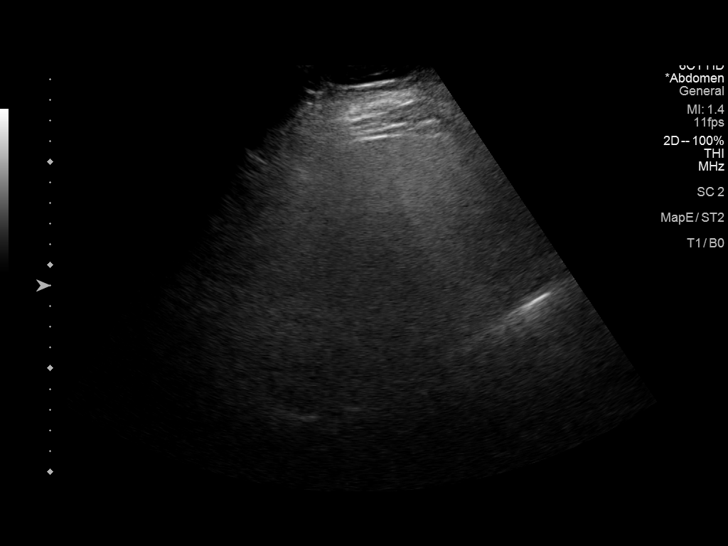
[im 45/45]
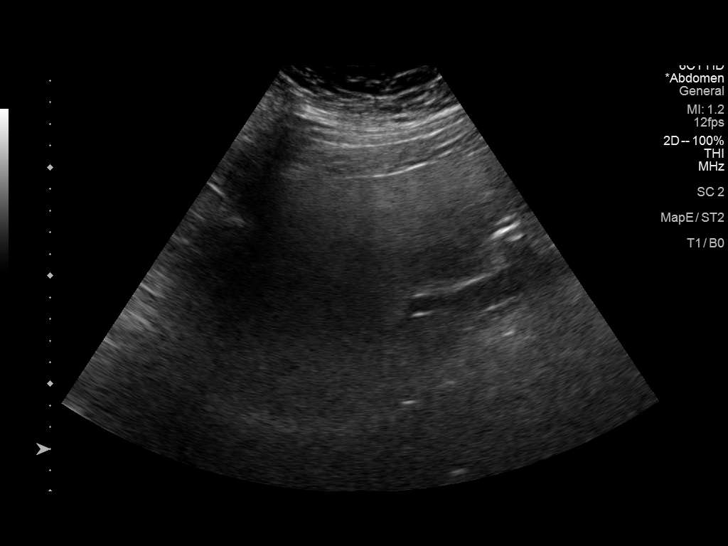

[14 of 25 positions shown; findings below may reference images not displayed]

FINDINGS: Gallbladder:

No gallstones or wall thickening visualized. No sonographic Murphy
sign noted by sonographer.

Common bile duct:

Diameter: 3 mm, within normal limits.

Liver:

No focal lesion identified. Generalized increased hepatic
parenchymal echogenicity. Portal vein is patent on color Doppler
imaging with normal direction of blood flow towards the liver.
IMPRESSION: Hyperechogenicity of the hepatic parenchyma. This is a nonspecific
finding, which may be seen in the setting of hepatic steatosis or
other chronic hepatic parenchymal disease.

Otherwise unremarkable right upper quadrant ultrasound as described.

## 2019-10-17 ENCOUNTER — Ambulatory Visit: Payer: BC Managed Care – PPO | Admitting: Gastroenterology

## 2019-10-22 DIAGNOSIS — R1013 Epigastric pain: Secondary | ICD-10-CM | POA: Diagnosis not present

## 2019-10-22 DIAGNOSIS — R7302 Impaired glucose tolerance (oral): Secondary | ICD-10-CM | POA: Diagnosis not present

## 2019-10-24 ENCOUNTER — Other Ambulatory Visit (INDEPENDENT_AMBULATORY_CARE_PROVIDER_SITE_OTHER): Payer: BC Managed Care – PPO

## 2019-10-24 ENCOUNTER — Telehealth (INDEPENDENT_AMBULATORY_CARE_PROVIDER_SITE_OTHER): Payer: BC Managed Care – PPO | Admitting: Family Medicine

## 2019-10-24 ENCOUNTER — Other Ambulatory Visit: Payer: Self-pay

## 2019-10-24 DIAGNOSIS — R7303 Prediabetes: Secondary | ICD-10-CM

## 2019-10-24 DIAGNOSIS — E781 Pure hyperglyceridemia: Secondary | ICD-10-CM | POA: Diagnosis not present

## 2019-10-24 DIAGNOSIS — Z0000 Encounter for general adult medical examination without abnormal findings: Secondary | ICD-10-CM

## 2019-10-24 DIAGNOSIS — E282 Polycystic ovarian syndrome: Secondary | ICD-10-CM

## 2019-10-24 LAB — COMPREHENSIVE METABOLIC PANEL
ALT: 19 U/L (ref 0–35)
AST: 17 U/L (ref 0–37)
Albumin: 4.3 g/dL (ref 3.5–5.2)
Alkaline Phosphatase: 45 U/L (ref 39–117)
BUN: 12 mg/dL (ref 6–23)
CO2: 29 mEq/L (ref 19–32)
Calcium: 9 mg/dL (ref 8.4–10.5)
Chloride: 105 mEq/L (ref 96–112)
Creatinine, Ser: 0.76 mg/dL (ref 0.40–1.20)
GFR: 81.25 mL/min (ref >60.00)
Glucose, Bld: 146 mg/dL — ABNORMAL HIGH (ref 70–99)
Potassium: 4.3 mEq/L (ref 3.5–5.1)
Sodium: 140 mEq/L (ref 135–145)
Total Bilirubin: 0.5 mg/dL (ref 0.2–1.2)
Total Protein: 6.7 g/dL (ref 6.0–8.3)

## 2019-10-24 LAB — CBC WITH DIFFERENTIAL/PLATELET
Basophils Absolute: 0 K/uL (ref 0.0–0.1)
Basophils Relative: 0.5 % (ref 0.0–3.0)
Eosinophils Absolute: 0.1 K/uL (ref 0.0–0.7)
Eosinophils Relative: 1.9 % (ref 0.0–5.0)
HCT: 39.7 % (ref 36.0–46.0)
Hemoglobin: 13.5 g/dL (ref 12.0–15.0)
Lymphocytes Relative: 37 % (ref 12.0–46.0)
Lymphs Abs: 1.9 K/uL (ref 0.7–4.0)
MCHC: 34 g/dL (ref 30.0–36.0)
MCV: 86.9 fl (ref 78.0–100.0)
Monocytes Absolute: 0.3 K/uL (ref 0.1–1.0)
Monocytes Relative: 5.7 % (ref 3.0–12.0)
Neutro Abs: 2.8 K/uL (ref 1.4–7.7)
Neutrophils Relative %: 54.9 % (ref 43.0–77.0)
Platelets: 197 K/uL (ref 150.0–400.0)
RBC: 4.57 Mil/uL (ref 3.87–5.11)
RDW: 14.1 % (ref 11.5–15.5)
WBC: 5.1 K/uL (ref 4.0–10.5)

## 2019-10-24 LAB — LIPID PANEL
Cholesterol: 138 mg/dL (ref 0–200)
HDL: 45.3 mg/dL
LDL Cholesterol: 73 mg/dL (ref 0–99)
NonHDL: 92.99
Total CHOL/HDL Ratio: 3
Triglycerides: 98 mg/dL (ref 0.0–149.0)
VLDL: 19.6 mg/dL (ref 0.0–40.0)

## 2019-10-24 LAB — HEMOGLOBIN A1C: Hgb A1c MFr Bld: 7.1 % — ABNORMAL HIGH (ref 4.6–6.5)

## 2019-10-24 LAB — TSH: TSH: 1.09 u[IU]/mL (ref 0.35–4.50)

## 2019-10-24 NOTE — Telephone Encounter (Signed)
-----   Message from Ellamae Sia sent at 10/24/2019  8:13 AM EDT ----- Regarding: labs for now Patient is scheduled for CPX labs, please order future labs, Thanks , Karna Christmas

## 2019-11-04 ENCOUNTER — Other Ambulatory Visit: Payer: Self-pay

## 2019-11-04 ENCOUNTER — Ambulatory Visit (INDEPENDENT_AMBULATORY_CARE_PROVIDER_SITE_OTHER): Payer: BC Managed Care – PPO | Admitting: Family Medicine

## 2019-11-04 ENCOUNTER — Encounter: Payer: Self-pay | Admitting: Family Medicine

## 2019-11-04 VITALS — BP 130/82 | HR 92 | Temp 96.9°F | Ht 66.0 in | Wt 264.2 lb

## 2019-11-04 DIAGNOSIS — R1013 Epigastric pain: Secondary | ICD-10-CM

## 2019-11-04 DIAGNOSIS — F411 Generalized anxiety disorder: Secondary | ICD-10-CM

## 2019-11-04 DIAGNOSIS — Z0000 Encounter for general adult medical examination without abnormal findings: Secondary | ICD-10-CM

## 2019-11-04 DIAGNOSIS — E781 Pure hyperglyceridemia: Secondary | ICD-10-CM

## 2019-11-04 DIAGNOSIS — E282 Polycystic ovarian syndrome: Secondary | ICD-10-CM | POA: Diagnosis not present

## 2019-11-04 DIAGNOSIS — R7303 Prediabetes: Secondary | ICD-10-CM

## 2019-11-04 MED ORDER — ATORVASTATIN CALCIUM 20 MG PO TABS: 20.0000 mg | ORAL_TABLET | Freq: Every day | ORAL | 3 refills | Status: DC

## 2019-11-04 MED ORDER — ESCITALOPRAM OXALATE 10 MG PO TABS: 10.0000 mg | ORAL_TABLET | Freq: Every day | ORAL | 3 refills | Status: DC

## 2019-11-04 MED ORDER — MIGLITOL 25 MG PO TABS: 25.0000 mg | ORAL_TABLET | Freq: Two times a day (BID) | ORAL | 3 refills | Status: DC

## 2019-11-04 MED ORDER — FENOFIBRIC ACID 135 MG PO CPDR: 135.0000 mg | DELAYED_RELEASE_CAPSULE | Freq: Every day | ORAL | 3 refills | Status: DC

## 2019-11-04 NOTE — Assessment & Plan Note (Signed)
Pt is seen by GI and has egd planned  If neg- ? Consider Hida scan

## 2019-11-04 NOTE — Assessment & Plan Note (Signed)
Pt continues to have difficulty loosing weight  Also a1c up to 7.1 (in diabetic range)  Will f/u with endocrinology  May benefit from metformin

## 2019-11-04 NOTE — Assessment & Plan Note (Signed)
Cholesterol is well controlled with statin and fibrate Numbers are improved with a better diet  Disc goals for lipids and reasons to control them Rev last labs with pt Rev low sat fat diet in detail

## 2019-11-04 NOTE — Patient Instructions (Addendum)
Consider a flu shot in the fall   Your a1c is 7.1 -high  Please talk to Dr Forde Dandy - ? If metformin would be helpful   Keep working on healthy diet and exercise   Other than glucose- labs are stable

## 2019-11-04 NOTE — Progress Notes (Signed)
Subjective:    Patient ID: Gail [name], female    DOB: Dec 01, 1970, 49 y.o.   MRN: [id number]  This visit occurred during the SARS-CoV-2 public health emergency.  Safety protocols were in place, including screening questions prior to the visit, additional usage of staff PPE, and extensive cleaning of exam room while observing appropriate contact time as indicated for disinfecting solutions.    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   Not taking great care of himself  Having abd pain lately- went to GI and is going to get EGD Seeing Dr Forde Dandy   Diet is good overall  Has a fatty liver  Eating more fruits and veg  Exercise - still walking 30 minutes 4 d per week    She is covid vaccinated Tdap 9/13 Flu vaccine - does not get Lu Duffel consider   Pap 7/18 -neg at gyn with neg HPV screen Had a hysterectomy later that year  No more periods   Mammogram 6/21 Self breast exam -no breast lumps   BP Readings from Last 3 Encounters:  11/04/19 130/82  08/14/19 (!) 125/94  10/22/18 128/82   Pulse Readings from Last 3 Encounters:  11/04/19 92  08/14/19 72  10/22/18 94    Hyperlipidemia/trig  Lab Results  Component Value Date   CHOL 138 10/24/2019   CHOL 181 10/15/2018   CHOL 196 09/25/2017   Lab Results  Component Value Date   HDL 45.30 10/24/2019   HDL 50.00 10/15/2018   HDL 48.40 09/25/2017   Lab Results  Component Value Date   LDLCALC 73 10/24/2019   LDLCALC 110 (H) 09/25/2017   LDLCALC 68 07/21/2016   Lab Results  Component Value Date   TRIG 98.0 10/24/2019   TRIG 207.0 (H) 10/15/2018   TRIG 186.0 (H) 09/25/2017   Lab Results  Component Value Date   CHOLHDL 3 10/24/2019   CHOLHDL 4 10/15/2018   CHOLHDL 4  09/25/2017   Lab Results  Component Value Date   LDLDIRECT 109.0 10/15/2018   LDLDIRECT 106.4 12/05/2012   Statin and fibrate Better diet    Prediabetes Lab Results  Component Value Date   HGBA1C 7.1 (H) 10/24/2019   glyset bid  This is up    Glucose 146 fasting   Heels hurt in the am -? Plantar fasciitis    Other labs  Lab Results  Component Value Date   CREATININE 0.76 10/24/2019   BUN 12 10/24/2019   NA 140 10/24/2019   K 4.3 10/24/2019   CL 105 10/24/2019   CO2 29 10/24/2019   Lab Results  Component Value Date   ALT 19 10/24/2019   AST 17 10/24/2019   ALKPHOS 45 10/24/2019   BILITOT 0.5 10/24/2019    Lab Results  Component Value Date   WBC 5.1 10/24/2019   HGB 13.5 10/24/2019   HCT 39.7 10/24/2019   MCV 86.9 10/24/2019   PLT 197.0 10/24/2019   Lab Results  Component Value Date   TSH 1.09 10/24/2019     Patient Active Problem List   Diagnosis Date Noted  . Epigastric pain 08/26/2019  . Screening mammogram, encounter for 10/22/2018  . PCOS (polycystic ovarian syndrome)  09/26/2017  . Status post total abdominal hysterectomy 12/19/2016  . Insomnia 07/22/2016  . Auditory processing disorder 04/04/2016  . Encounter for routine gynecological examination 11/27/2014  . Morbid obesity ([place]) 12/13/2012  . Prediabetes 12/13/2012  . Routine general medical examination at a health care facility 12/04/2011  . HYPERTRIGLYCERIDEMIA 04/07/2008  . PROBLEMS WITH HEARING 04/07/2008  . Anxiety disorder 02/07/2008  . ALLERGIC RHINITIS 02/07/2008  . HYPOGLYCEMIA, REACTIVE 11/15/2006   Past Medical History:  Diagnosis Date  . Anemia   . Anxiety   . Depression   . Diabetes mellitus without complication ([place])    type 2  . Dyspnea   . Hearing loss    wears hearing aids bilateral  . History of allergic rhinitis   . History of anxiety   . History of blood transfusion    [facility] 09/2016 2 units transfused  . History of depression   . History of hyperlipidemia     . History of syncope   . Seizures ([place])    hx seizures as a child,no seizures since age 65 yrs old  . Sleep apnea    uses mouth guard at night   Past Surgical History:  Procedure Laterality Date  . [place]   x 2  . CYSTOSCOPY N/A 12/19/2016   Procedure: CYSTOSCOPY;  Surgeon: Nunzio Cobbs, MD;  Location: [facility];  Service: Gynecology;  Laterality: N/A;  . TOTAL LAPAROSCOPIC HYSTERECTOMY WITH SALPINGECTOMY Bilateral 12/19/2016   Procedure: HYSTERECTOMY TOTAL LAPAROSCOPIC WITH SALPINGECTOMY;  Surgeon: Nunzio Cobbs, MD;  Location: [facility];  Service: Gynecology;  Laterality: Bilateral;  . WISDOM TOOTH EXTRACTION     Social History   Tobacco Use  . Smoking status: Never Smoker  . Smokeless tobacco: Never Used  Vaping Use  . Vaping Use: Never used  Substance Use Topics  . Alcohol use: Yes    Alcohol/week: 0.0 standard drinks    Comment: occasional  winie  . Drug use: No   Family History  Problem Relation Age of Onset  . Diabetes Father   . Diabetes Paternal Grandfather   . Prostate cancer Paternal Grandfather   . Diabetes Paternal Grandmother    Allergies  Allergen Reactions  . Influenza Vaccine Recombinant     Vomiting   . Penicillins Hives    Has patient had a PCN reaction causing immediate rash, facial/tongue/throat swelling, SOB or lightheadedness with hypotension: Yes Has patient had a PCN reaction causing severe rash involving mucus membranes or skin necrosis: No Has patient had a PCN reaction that required hospitalization: No Has patient had a PCN reaction occurring within the last 10 years: No If all of the above answers are "NO", then may proceed with Cephalosporin use.    Current Outpatient Medications on File Prior to Visit  Medication Sig Dispense Refill  . ONETOUCH VERIO test strip USE TO CHECK BLOOD SUGAR ONCE DAILY AS NEEDED 50 strip 0   No current facility-administered medications on file prior to visit.     Review of Systems  Constitutional: Positive for fatigue. Negative for activity change, appetite change, fever and unexpected weight change.  HENT: Negative for congestion, ear pain, rhinorrhea, sinus pressure and sore throat.   Eyes: Negative for pain, redness and visual disturbance.  Respiratory: Negative for cough, shortness of breath and wheezing.   Cardiovascular: Negative for chest pain and palpitations.  Gastrointestinal: Positive for abdominal pain. Negative for abdominal distention, blood in stool, constipation and diarrhea.  Endocrine:  Negative for polydipsia, polyphagia and polyuria.  Genitourinary: Negative for dysuria, frequency and urgency.  Musculoskeletal: Negative for arthralgias, back pain and myalgias.  Skin: Negative for pallor and rash.  Allergic/Immunologic: Negative for environmental allergies.  Neurological: Negative for dizziness, syncope and headaches.  Hematological: Negative for adenopathy. Does not bruise/bleed easily.  Psychiatric/Behavioral: Negative for decreased concentration and dysphoric mood. The patient is not nervous/anxious.        Objective:   Physical Exam Constitutional:      General: She is not in acute distress.    Appearance: Normal appearance. She is well-developed. She is obese. She is not ill-appearing or diaphoretic.  HENT:     Head: Normocephalic and atraumatic.     Right Ear: Tympanic membrane, ear canal and external ear normal.     Left Ear: Tympanic membrane, ear canal and external ear normal.     Nose: Nose normal. No congestion.     Mouth/Throat:     Mouth: Mucous membranes are moist.     Pharynx: Oropharynx is clear. No posterior oropharyngeal erythema.  Eyes:     General: No scleral icterus.    Extraocular Movements: Extraocular movements intact.     Conjunctiva/sclera: Conjunctivae normal.     Pupils: Pupils are equal, round, and reactive to light.  Neck:     Thyroid: No thyromegaly.     Vascular: No carotid bruit or  JVD.  Cardiovascular:     Rate and Rhythm: Normal rate and regular rhythm.     Pulses: Normal pulses.     Heart sounds: Normal heart sounds. No gallop.   Pulmonary:     Effort: Pulmonary effort is normal. No respiratory distress.     Breath sounds: Normal breath sounds. No wheezing.     Comments: Good air exch Chest:     Chest wall: No tenderness.  Abdominal:     General: Bowel sounds are normal. There is no distension or abdominal bruit.     Palpations: Abdomen is soft. There is no mass.     Tenderness: There is no abdominal tenderness.     Hernia: No hernia is present.  Genitourinary:    Comments: Breast and pelvic exam done by gyn provider  Musculoskeletal:        General: No tenderness. Normal range of motion.     Cervical back: Normal range of motion and neck supple. No rigidity. No muscular tenderness.     Right lower leg: No edema.     Left lower leg: No edema.  Lymphadenopathy:     Cervical: No cervical adenopathy.  Skin:    General: Skin is warm and dry.     Coloration: Skin is not pale.     Findings: No erythema or rash.     Comments: Solar lentigines diffusely tanned  Neurological:     Mental Status: She is alert. Mental status is at baseline.     Cranial Nerves: No cranial nerve deficit.     Motor: No abnormal muscle tone.     Coordination: Coordination normal.     Gait: Gait normal.     Deep Tendon Reflexes: Reflexes are normal and symmetric. Reflexes normal.  Psychiatric:        Mood and Affect: Mood normal.        Cognition and Memory: Cognition and memory normal.           Assessment & Plan:   Problem List Items Addressed This Visit      Endocrine   PCOS (  polycystic ovarian syndrome)    Pt continues to have difficulty loosing weight  Also a1c up to 7.1 (in diabetic range)  Will f/u with endocrinology  May benefit from metformin         Other   HYPERTRIGLYCERIDEMIA    Cholesterol is well controlled with statin and fibrate Numbers are  improved with a better diet  Disc goals for lipids and reasons to control them Rev last labs with pt Rev low sat fat diet in detail       Relevant Medications   Choline Fenofibrate (FENOFIBRIC ACID) 135 MG CPDR   atorvastatin (LIPITOR) 20 MG tablet   Anxiety disorder    Well controlled with lexapro  Reviewed stressors/ coping techniques/symptoms/ support sources/ tx options and side effects in detail today       Relevant Medications   escitalopram (LEXAPRO) 10 MG tablet   Routine general medical examination at a health care facility - Primary    Reviewed health habits including diet and exercise and skin cancer prevention Reviewed appropriate screening tests for age  Also reviewed health mt list, fam hx and immunization status , as well as social and family history   See HPI Labs reviewed  covid immunized  Enc strongly to get a flu shot in the fall Working hard on wt loss  utd gyn care (s/p hysterectomy)      Morbid obesity ([place])    .Discussed how this problem influences overall health and the risks it imposes  Reviewed plan for weight loss with lower calorie diet (via better food choices and also portion control or program like weight watchers) and exercise building up to or more than 30 minutes 5 days per week including some aerobic activity   pcos may add to difficulty loosing wt  Giving a good effort  a1c is also up  Will f/u with endocrinology      Relevant Medications   miglitol (GLYSET) 25 MG tablet   Prediabetes    Lab Results  Component Value Date   HGBA1C 7.1 (H) 10/24/2019   Now in DM range  Will f/u with endocrinology -? If may benefit from metformin (with caution due to past hypoglycemia)  She is working on wt loss       Epigastric pain    Pt is seen by GI and has egd planned  If neg- ? Consider Hida scan

## 2019-11-04 NOTE — Assessment & Plan Note (Signed)
Reviewed health habits including diet and exercise and skin cancer prevention Reviewed appropriate screening tests for age  Also reviewed health mt list, fam hx and immunization status , as well as social and family history   See HPI Labs reviewed  covid immunized  Enc strongly to get a flu shot in the fall Working hard on wt loss  utd gyn care (s/p hysterectomy)

## 2019-11-04 NOTE — Assessment & Plan Note (Signed)
Lab Results  Component Value Date   HGBA1C 7.1 (H) 10/24/2019   Now in DM range  Will f/u with endocrinology -? If may benefit from metformin (with caution due to past hypoglycemia)  She is working on wt loss

## 2019-11-04 NOTE — Assessment & Plan Note (Signed)
.  Discussed how this problem influences overall health and the risks it imposes  Reviewed plan for weight loss with lower calorie diet (via better food choices and also portion control or program like weight watchers) and exercise building up to or more than 30 minutes 5 days per week including some aerobic activity   pcos may add to difficulty loosing wt  Giving a good effort  a1c is also up  Will f/u with endocrinology

## 2019-11-04 NOTE — Assessment & Plan Note (Signed)
Well controlled with lexapro  Reviewed stressors/ coping techniques/symptoms/ support sources/ tx options and side effects in detail today

## 2019-11-11 ENCOUNTER — Ambulatory Visit: Payer: BC Managed Care – PPO | Admitting: Gastroenterology

## 2019-12-30 DIAGNOSIS — H2513 Age-related nuclear cataract, bilateral: Secondary | ICD-10-CM | POA: Diagnosis not present

## 2019-12-30 DIAGNOSIS — H04123 Dry eye syndrome of bilateral lacrimal glands: Secondary | ICD-10-CM | POA: Diagnosis not present

## 2019-12-30 DIAGNOSIS — H02401 Unspecified ptosis of right eyelid: Secondary | ICD-10-CM | POA: Diagnosis not present

## 2020-02-09 DIAGNOSIS — Z1159 Encounter for screening for other viral diseases: Secondary | ICD-10-CM | POA: Diagnosis not present

## 2020-02-11 DIAGNOSIS — K2289 Other specified disease of esophagus: Secondary | ICD-10-CM | POA: Diagnosis not present

## 2020-02-11 DIAGNOSIS — K219 Gastro-esophageal reflux disease without esophagitis: Secondary | ICD-10-CM | POA: Diagnosis not present

## 2020-02-11 DIAGNOSIS — K5730 Diverticulosis of large intestine without perforation or abscess without bleeding: Secondary | ICD-10-CM | POA: Diagnosis not present

## 2020-02-11 DIAGNOSIS — K922 Gastrointestinal hemorrhage, unspecified: Secondary | ICD-10-CM | POA: Diagnosis not present

## 2020-02-11 DIAGNOSIS — K2980 Duodenitis without bleeding: Secondary | ICD-10-CM | POA: Diagnosis not present

## 2020-02-11 DIAGNOSIS — Z1211 Encounter for screening for malignant neoplasm of colon: Secondary | ICD-10-CM | POA: Diagnosis not present

## 2020-02-11 DIAGNOSIS — K3189 Other diseases of stomach and duodenum: Secondary | ICD-10-CM | POA: Diagnosis not present

## 2020-02-11 DIAGNOSIS — K209 Esophagitis, unspecified without bleeding: Secondary | ICD-10-CM | POA: Diagnosis not present

## 2020-02-11 DIAGNOSIS — R1013 Epigastric pain: Secondary | ICD-10-CM | POA: Diagnosis not present

## 2020-02-11 DIAGNOSIS — K2930 Chronic superficial gastritis without bleeding: Secondary | ICD-10-CM | POA: Diagnosis not present

## 2020-02-11 LAB — HM COLONOSCOPY

## 2020-03-22 DIAGNOSIS — G4733 Obstructive sleep apnea (adult) (pediatric): Secondary | ICD-10-CM | POA: Diagnosis not present

## 2020-04-27 DIAGNOSIS — E785 Hyperlipidemia, unspecified: Secondary | ICD-10-CM | POA: Diagnosis not present

## 2020-04-27 DIAGNOSIS — E119 Type 2 diabetes mellitus without complications: Secondary | ICD-10-CM | POA: Diagnosis not present

## 2020-05-18 DIAGNOSIS — G4733 Obstructive sleep apnea (adult) (pediatric): Secondary | ICD-10-CM | POA: Diagnosis not present

## 2020-06-10 DIAGNOSIS — G4733 Obstructive sleep apnea (adult) (pediatric): Secondary | ICD-10-CM | POA: Diagnosis not present

## 2020-06-23 DIAGNOSIS — R82998 Other abnormal findings in urine: Secondary | ICD-10-CM | POA: Diagnosis not present

## 2020-06-23 DIAGNOSIS — E785 Hyperlipidemia, unspecified: Secondary | ICD-10-CM | POA: Diagnosis not present

## 2020-06-23 DIAGNOSIS — E119 Type 2 diabetes mellitus without complications: Secondary | ICD-10-CM | POA: Diagnosis not present

## 2020-06-25 DIAGNOSIS — G4733 Obstructive sleep apnea (adult) (pediatric): Secondary | ICD-10-CM | POA: Diagnosis not present

## 2020-07-11 DIAGNOSIS — G4733 Obstructive sleep apnea (adult) (pediatric): Secondary | ICD-10-CM | POA: Diagnosis not present

## 2020-08-10 DIAGNOSIS — G4733 Obstructive sleep apnea (adult) (pediatric): Secondary | ICD-10-CM | POA: Diagnosis not present

## 2020-08-11 ENCOUNTER — Other Ambulatory Visit: Payer: Self-pay | Admitting: Family Medicine

## 2020-08-11 DIAGNOSIS — Z1231 Encounter for screening mammogram for malignant neoplasm of breast: Secondary | ICD-10-CM

## 2020-08-20 ENCOUNTER — Other Ambulatory Visit: Payer: Self-pay

## 2020-08-20 ENCOUNTER — Emergency Department (HOSPITAL_BASED_OUTPATIENT_CLINIC_OR_DEPARTMENT_OTHER)
Admission: EM | Admit: 2020-08-20 | Discharge: 2020-08-20 | Disposition: A | Discharge status: Home / Self Care | Payer: BC Managed Care – PPO | Attending: Emergency Medicine | Admitting: Emergency Medicine

## 2020-08-20 DIAGNOSIS — I10 Essential (primary) hypertension: Secondary | ICD-10-CM

## 2020-08-20 DIAGNOSIS — I169 Hypertensive crisis, unspecified: Secondary | ICD-10-CM | POA: Diagnosis not present

## 2020-08-20 DIAGNOSIS — R519 Headache, unspecified: Secondary | ICD-10-CM | POA: Diagnosis not present

## 2020-08-20 DIAGNOSIS — E1165 Type 2 diabetes mellitus with hyperglycemia: Secondary | ICD-10-CM | POA: Diagnosis not present

## 2020-08-20 DIAGNOSIS — Z79899 Other long term (current) drug therapy: Secondary | ICD-10-CM | POA: Diagnosis not present

## 2020-08-20 DIAGNOSIS — R739 Hyperglycemia, unspecified: Secondary | ICD-10-CM

## 2020-08-20 DIAGNOSIS — J0110 Acute frontal sinusitis, unspecified: Secondary | ICD-10-CM | POA: Diagnosis not present

## 2020-08-20 DIAGNOSIS — E119 Type 2 diabetes mellitus without complications: Secondary | ICD-10-CM | POA: Diagnosis not present

## 2020-08-20 LAB — CBC
HCT: 40.7 % (ref 36.0–46.0)
Hemoglobin: 13.8 g/dL (ref 12.0–15.0)
MCH: 28.6 pg (ref 26.0–34.0)
MCHC: 33.9 g/dL (ref 30.0–36.0)
MCV: 84.4 fL (ref 80.0–100.0)
Platelets: 279 10*3/uL (ref 150–400)
RBC: 4.82 MIL/uL (ref 3.87–5.11)
RDW: 12.9 % (ref 11.5–15.5)
WBC: 7 10*3/uL (ref 4.0–10.5)
nRBC: 0 % (ref 0.0–0.2)

## 2020-08-20 LAB — BASIC METABOLIC PANEL
Anion gap: 12 (ref 5–15)
BUN: 14 mg/dL (ref 6–20)
CO2: 25 mmol/L (ref 22–32)
Calcium: 8.6 mg/dL — ABNORMAL LOW (ref 8.9–10.3)
Chloride: 102 mmol/L (ref 98–111)
Creatinine, Ser: 0.73 mg/dL (ref 0.44–1.00)
GFR, Estimated: >60 mL/min (ref >60)
Glucose, Bld: 176 mg/dL — ABNORMAL HIGH (ref 70–99)
Potassium: 3.7 mmol/L (ref 3.5–5.1)
Sodium: 139 mmol/L (ref 135–145)

## 2020-08-20 LAB — TROPONIN I (HIGH SENSITIVITY): Troponin I (High Sensitivity): 4 ng/L (ref <18)

## 2020-08-20 LAB — CBG MONITORING, ED: Glucose-Capillary: 167 mg/dL — ABNORMAL HIGH (ref 70–99)

## 2020-08-20 MED ORDER — ONDANSETRON HCL 4 MG/2ML IJ SOLN
4.0000 mg | Freq: Once | INTRAMUSCULAR | Status: AC
  Administered 2020-08-20: 4 mg via INTRAVENOUS
  Filled 2020-08-20: qty 2

## 2020-08-20 MED ORDER — FENTANYL CITRATE (PF) 100 MCG/2ML IJ SOLN
50.0000 ug | Freq: Once | INTRAMUSCULAR | Status: AC
  Administered 2020-08-20: 50 ug via INTRAVENOUS
  Filled 2020-08-20: qty 2

## 2020-08-20 MED ORDER — DOXYCYCLINE HYCLATE 100 MG PO CAPS
100.0000 mg | ORAL_CAPSULE | Freq: Two times a day (BID) | ORAL | 0 refills | Status: AC
Start: 2020-08-20 — End: 2020-08-27

## 2020-08-20 MED ORDER — DOXYCYCLINE HYCLATE 100 MG PO TABS
100.0000 mg | ORAL_TABLET | Freq: Once | ORAL | Status: AC
  Administered 2020-08-20: 100 mg via ORAL
  Filled 2020-08-20: qty 1

## 2020-08-20 MED ORDER — KETOROLAC TROMETHAMINE 30 MG/ML IJ SOLN
30.0000 mg | Freq: Once | INTRAMUSCULAR | Status: AC
  Administered 2020-08-20: 30 mg via INTRAVENOUS
  Filled 2020-08-20: qty 1

## 2020-08-20 NOTE — ED Notes (Signed)
Pt verbalizes understanding of discharge instructions. Opportunity for questioning and answers were provided. Armand removed by staff, pt discharged from ED to home. Instructed to pick up Rx.  

## 2020-08-20 NOTE — ED Triage Notes (Addendum)
Pt to ED from urgent care with c/o headache and HTN that began at 1830 this evening. PT went to urgent care and had a high blood pressure reading that urgent care would not release to her. Urgent care treated her with 0.1 of clonidine before telling her to come to the ER. Pt does not report any N/V or dizziness in triage. NIHSS is 0 in triage.

## 2020-08-20 NOTE — ED Provider Notes (Signed)
[facility] Provider Note   CSN: [id number] Arrival date & time: 08/20/20  2017     History Chief Complaint  Patient presents with  . Headache  . Hypertension    Gail [name] is a 50 y.o. female.  She went to urgent care for her headache, and her blood pressure was quite elevated.  She was given clonidine and directed to the emergency department.  She has never had a history of hypertension and has had hypotension usually.  The history is provided by the patient.  Headache Pain location:  Frontal Quality: throbbing. Radiates to:  Does not radiate Pain severity now: moderate. Pain scale at highest: severe. Onset quality:  Gradual Duration:  1 day Timing:  Intermittent Progression:  Waxing and waning Chronicity:  New Similar to prior headaches: no   Context comment:  Has had congestion and a cough for several days Relieved by:  NSAIDs Worsened by:  Nothing Associated symptoms: blurred vision, congestion and cough   Associated symptoms: no abdominal pain, no back pain, no diarrhea, no ear pain, no eye pain, no fever, no focal weakness, no neck stiffness, no numbness, no photophobia, no seizures, no sore throat and no vomiting   Hypertension Associated symptoms include headaches. Pertinent negatives include no chest pain, no abdominal pain and no shortness of breath.       Past Medical History:  Diagnosis Date  . Anemia   . Anxiety   . Depression   . Diabetes mellitus without complication ([place])    type 2  . Dyspnea   . Hearing loss    wears hearing aids bilateral  . History of allergic rhinitis   . History of anxiety   . History of blood transfusion    [facility] 09/2016 2 units transfused  . History of depression   . History of hyperlipidemia   . History of syncope   . Seizures ([place])    hx seizures as a child,no seizures since age 44 yrs old  . Sleep apnea    uses mouth guard at night    Patient Active Problem List   Diagnosis Date  Noted  . Epigastric pain 08/26/2019  . Screening mammogram, encounter for 10/22/2018  . PCOS (polycystic ovarian syndrome) 09/26/2017  . Status post total abdominal hysterectomy 12/19/2016  . Insomnia 07/22/2016  . Auditory processing disorder 04/04/2016  . Encounter for routine gynecological examination 11/27/2014  . Morbid obesity ([place]) 12/13/2012  . Prediabetes 12/13/2012  . Routine general medical examination at a health care facility 12/04/2011  . HYPERTRIGLYCERIDEMIA 04/07/2008  . PROBLEMS WITH HEARING 04/07/2008  . Anxiety disorder 02/07/2008  . ALLERGIC RHINITIS 02/07/2008  . HYPOGLYCEMIA, REACTIVE 11/15/2006    Past Surgical History:  Procedure Laterality Date  . [place]   x 2  . CYSTOSCOPY N/A 12/19/2016   Procedure: CYSTOSCOPY;  Surgeon: Nunzio Cobbs, MD;  Location: [facility];  Service: Gynecology;  Laterality: N/A;  . TOTAL LAPAROSCOPIC HYSTERECTOMY WITH SALPINGECTOMY Bilateral 12/19/2016   Procedure: HYSTERECTOMY TOTAL LAPAROSCOPIC WITH SALPINGECTOMY;  Surgeon: Nunzio Cobbs, MD;  Location: [facility];  Service: Gynecology;  Laterality: Bilateral;  . WISDOM TOOTH EXTRACTION       OB History    Gravida  2   Para  2   Term  0   Preterm  0   AB  0   Living  2     SAB  0   IAB  0  Ectopic  0   Multiple  0   Live Births  0           Family History  Problem Relation Age of Onset  . Diabetes Father   . Diabetes Paternal Grandfather   . Prostate cancer Paternal Grandfather   . Diabetes Paternal Grandmother     Social History   Tobacco Use  . Smoking status: Never Smoker  . Smokeless tobacco: Never Used  Vaping Use  . Vaping Use: Never used  Substance Use Topics  . Alcohol use: Yes    Alcohol/week: 0.0 standard drinks    Comment: occasional  winie  . Drug use: No    Home Medications Prior to Admission medications   Medication Sig Start Date End Date Taking? Authorizing Provider  doxycycline  (VIBRAMYCIN) 100 MG capsule Take 1 capsule (100 mg total) by mouth 2 (two) times daily for 7 days. 08/20/20 08/27/20 Yes Arnaldo Natal, MD  atorvastatin (LIPITOR) 20 MG tablet Take 1 tablet (20 mg total) by mouth at bedtime. 11/04/19   Tower, Wynelle Fanny, MD  Choline Fenofibrate (FENOFIBRIC ACID) 135 MG CPDR Take 135 mg by mouth at bedtime. 11/04/19   Tower, Wynelle Fanny, MD  escitalopram (LEXAPRO) 10 MG tablet Take 1 tablet (10 mg total) by mouth daily. 11/04/19   Tower, Wynelle Fanny, MD  miglitol (GLYSET) 25 MG tablet Take 1 tablet (25 mg total) by mouth 2 (two) times daily. 11/04/19   Tower, Wynelle Fanny, MD  ONETOUCH VERIO test strip USE TO CHECK BLOOD SUGAR ONCE DAILY AS NEEDED 10/07/19   Tower, Wynelle Fanny, MD    Allergies    Influenza vaccine recombinant and Penicillins  Review of Systems   Review of Systems  Constitutional: Negative for chills and fever.  HENT: Positive for congestion. Negative for ear pain and sore throat.   Eyes: Positive for blurred vision. Negative for photophobia, pain and visual disturbance.  Respiratory: Positive for cough. Negative for shortness of breath.   Cardiovascular: Negative for chest pain and palpitations.  Gastrointestinal: Negative for abdominal pain, diarrhea and vomiting.  Genitourinary: Negative for dysuria and hematuria.  Musculoskeletal: Negative for arthralgias, back pain and neck stiffness.  Skin: Negative for color change and rash.  Neurological: Positive for headaches. Negative for focal weakness, seizures, syncope and numbness.  All other systems reviewed and are negative.   Physical Exam Updated Vital Signs BP (!) 163/98   Pulse 88   Temp 99.1 F (37.3 C) (Oral)   Resp 16   Ht 5\' 6"  (1.676 m)   Wt 113.4 kg   LMP 12/19/2016 (Exact Date)   SpO2 97%   BMI 40.35 kg/m   Physical Exam Vitals and nursing note reviewed.  HENT:     Head: Normocephalic and atraumatic.  Eyes:     General: No scleral icterus. Pulmonary:     Effort: Pulmonary effort is  normal. No respiratory distress.  Musculoskeletal:     Cervical back: Normal range of motion.  Skin:    General: Skin is warm and dry.  Neurological:     Mental Status: She is alert and oriented to person, place, and time. Mental status is at baseline.     Cranial Nerves: No cranial nerve deficit, dysarthria or facial asymmetry.     Sensory: No sensory deficit.     Motor: No weakness.     Coordination: Coordination normal.  Psychiatric:        Mood and Affect: Mood normal.  ED Results / Procedures / Treatments   Labs (all labs ordered are listed, but only abnormal results are displayed) Labs Reviewed  BASIC METABOLIC PANEL - Abnormal; Notable for the following components:      Result Value   Glucose, Bld 176 (*)    Calcium 8.6 (*)    All other components within normal limits  CBG MONITORING, ED - Abnormal; Notable for the following components:   Glucose-Capillary 167 (*)    All other components within normal limits  CBC  TROPONIN I (HIGH SENSITIVITY)    EKG None  Radiology No results found.  Procedures Procedures   Medications Ordered in ED Medications  ketorolac (TORADOL) 30 MG/ML injection 30 mg (has no administration in time range)  doxycycline (VIBRA-TABS) tablet 100 mg (has no administration in time range)  ondansetron (ZOFRAN) injection 4 mg (has no administration in time range)    ED Course  I have reviewed the triage vital signs and the nursing notes.  Pertinent labs & imaging results that were available during my care of the patient were reviewed by me and considered in my medical decision making (see chart for details).    MDM Rules/Calculators/A&P                          Ermagene T [name] presented with a headache and hypertension.  She has no neurologic signs or symptoms.  Headache appears benign.  It has been relieved with over-the-counter medicine.  Not thunderclap.  She also has had respiratory symptoms for 7 days, and I think that she is  potentially experiencing a headache from sinusitis.  Clonidine given at urgent care dropped her blood pressure significantly.  I would recommend she take her blood pressure at home and follow-up with her primary care doctor to ascertain whether or not her symptoms are related to hypertension or whether or not her acute pain played a role.  She also has mild hyperglycemia.  Again, she requires primary care follow-up in order to determine whether or not she has impaired glucose tolerance. Final Clinical Impression(s) / ED Diagnoses Final diagnoses:  Acute nonintractable headache, unspecified headache type  Acute non-recurrent frontal sinusitis  Hypertension, unspecified type  Hyperglycemia    Rx / DC Orders ED Discharge Orders         Ordered    doxycycline (VIBRAMYCIN) 100 MG capsule  2 times daily        08/20/20 2139           Arnaldo Natal, MD 08/20/20 2217

## 2020-08-21 ENCOUNTER — Emergency Department (HOSPITAL_BASED_OUTPATIENT_CLINIC_OR_DEPARTMENT_OTHER)
Admission: EM | Admit: 2020-08-21 | Discharge: 2020-08-22 | Disposition: A | Discharge status: Home / Self Care | Payer: BC Managed Care – PPO | Attending: Emergency Medicine | Admitting: Emergency Medicine

## 2020-08-21 ENCOUNTER — Other Ambulatory Visit: Payer: Self-pay

## 2020-08-21 DIAGNOSIS — Z79899 Other long term (current) drug therapy: Secondary | ICD-10-CM | POA: Insufficient documentation

## 2020-08-21 DIAGNOSIS — Z20822 Contact with and (suspected) exposure to covid-19: Secondary | ICD-10-CM | POA: Diagnosis not present

## 2020-08-21 DIAGNOSIS — Z7984 Long term (current) use of oral hypoglycemic drugs: Secondary | ICD-10-CM | POA: Diagnosis not present

## 2020-08-21 DIAGNOSIS — I10 Essential (primary) hypertension: Secondary | ICD-10-CM | POA: Diagnosis not present

## 2020-08-21 DIAGNOSIS — E119 Type 2 diabetes mellitus without complications: Secondary | ICD-10-CM | POA: Diagnosis not present

## 2020-08-21 DIAGNOSIS — R519 Headache, unspecified: Secondary | ICD-10-CM

## 2020-08-21 DIAGNOSIS — R030 Elevated blood-pressure reading, without diagnosis of hypertension: Secondary | ICD-10-CM

## 2020-08-21 MED ORDER — AMLODIPINE BESYLATE 5 MG PO TABS
5.0000 mg | ORAL_TABLET | Freq: Once | ORAL | Status: AC
  Administered 2020-08-22: 5 mg via ORAL
  Filled 2020-08-21: qty 1

## 2020-08-21 MED ORDER — ACETAMINOPHEN 500 MG PO TABS
1000.0000 mg | ORAL_TABLET | Freq: Once | ORAL | Status: AC
  Administered 2020-08-22: 1000 mg via ORAL
  Filled 2020-08-21: qty 2

## 2020-08-21 NOTE — ED Triage Notes (Signed)
Pt to ED from home with c/o ongoing HTN that began yesterday and has continued throughout the day. Pt states that her headache has gotten worse today even after medications and her pressure has been elevated all day.

## 2020-08-22 ENCOUNTER — Emergency Department (HOSPITAL_BASED_OUTPATIENT_CLINIC_OR_DEPARTMENT_OTHER): Payer: BC Managed Care – PPO

## 2020-08-22 ENCOUNTER — Encounter (HOSPITAL_BASED_OUTPATIENT_CLINIC_OR_DEPARTMENT_OTHER): Payer: Self-pay | Admitting: Emergency Medicine

## 2020-08-22 DIAGNOSIS — R519 Headache, unspecified: Secondary | ICD-10-CM | POA: Diagnosis not present

## 2020-08-22 DIAGNOSIS — G501 Atypical facial pain: Secondary | ICD-10-CM | POA: Diagnosis not present

## 2020-08-22 DIAGNOSIS — I10 Essential (primary) hypertension: Secondary | ICD-10-CM | POA: Diagnosis not present

## 2020-08-22 DIAGNOSIS — I517 Cardiomegaly: Secondary | ICD-10-CM | POA: Diagnosis not present

## 2020-08-22 LAB — COMPREHENSIVE METABOLIC PANEL
ALT: 35 U/L (ref 0–44)
AST: 40 U/L (ref 15–41)
Albumin: 4.3 g/dL (ref 3.5–5.0)
Alkaline Phosphatase: 56 U/L (ref 38–126)
Anion gap: 11 (ref 5–15)
BUN: 17 mg/dL (ref 6–20)
CO2: 26 mmol/L (ref 22–32)
Calcium: 8.7 mg/dL — ABNORMAL LOW (ref 8.9–10.3)
Chloride: 103 mmol/L (ref 98–111)
Creatinine, Ser: 0.75 mg/dL (ref 0.44–1.00)
GFR, Estimated: >60 mL/min (ref >60)
Glucose, Bld: 187 mg/dL — ABNORMAL HIGH (ref 70–99)
Potassium: 3.5 mmol/L (ref 3.5–5.1)
Sodium: 140 mmol/L (ref 135–145)
Total Bilirubin: 0.4 mg/dL (ref 0.3–1.2)
Total Protein: 7 g/dL (ref 6.5–8.1)

## 2020-08-22 LAB — URINALYSIS, ROUTINE W REFLEX MICROSCOPIC
Bilirubin Urine: NEGATIVE
Glucose, UA: NEGATIVE mg/dL
Hgb urine dipstick: NEGATIVE
Ketones, ur: NEGATIVE mg/dL
Leukocytes,Ua: NEGATIVE
Nitrite: NEGATIVE
Protein, ur: NEGATIVE mg/dL
Specific Gravity, Urine: <1.005 — ABNORMAL LOW (ref 1.005–1.030)
pH: 6.5 (ref 5.0–8.0)

## 2020-08-22 LAB — CBC WITH DIFFERENTIAL/PLATELET
Abs Immature Granulocytes: 0.09 10*3/uL — ABNORMAL HIGH (ref 0.00–0.07)
Basophils Absolute: 0 10*3/uL (ref 0.0–0.1)
Basophils Relative: 1 %
Eosinophils Absolute: 0.1 10*3/uL (ref 0.0–0.5)
Eosinophils Relative: 2 %
HCT: 41.9 % (ref 36.0–46.0)
Hemoglobin: 14 g/dL (ref 12.0–15.0)
Immature Granulocytes: 2 %
Lymphocytes Relative: 44 %
Lymphs Abs: 2.7 10*3/uL (ref 0.7–4.0)
MCH: 28.5 pg (ref 26.0–34.0)
MCHC: 33.4 g/dL (ref 30.0–36.0)
MCV: 85.3 fL (ref 80.0–100.0)
Monocytes Absolute: 0.4 10*3/uL (ref 0.1–1.0)
Monocytes Relative: 6 %
Neutro Abs: 2.8 10*3/uL (ref 1.7–7.7)
Neutrophils Relative %: 45 %
Platelets: 265 10*3/uL (ref 150–400)
RBC: 4.91 MIL/uL (ref 3.87–5.11)
RDW: 13 % (ref 11.5–15.5)
WBC: 6.1 10*3/uL (ref 4.0–10.5)
nRBC: 0 % (ref 0.0–0.2)

## 2020-08-22 LAB — RESP PANEL BY RT-PCR (FLU A&B, COVID) ARPGX2
Influenza A by PCR: NEGATIVE
Influenza B by PCR: NEGATIVE
SARS Coronavirus 2 by RT PCR: NEGATIVE

## 2020-08-22 LAB — PREGNANCY, URINE: Preg Test, Ur: NEGATIVE

## 2020-08-22 IMAGING — DX DG CHEST 1V
1 series · 1 of 1 positions shown · non-contrast
Comparison: None.

CLINICAL DATA: Headache and hypertension

EXAM:
CHEST  1 VIEW

[chest]
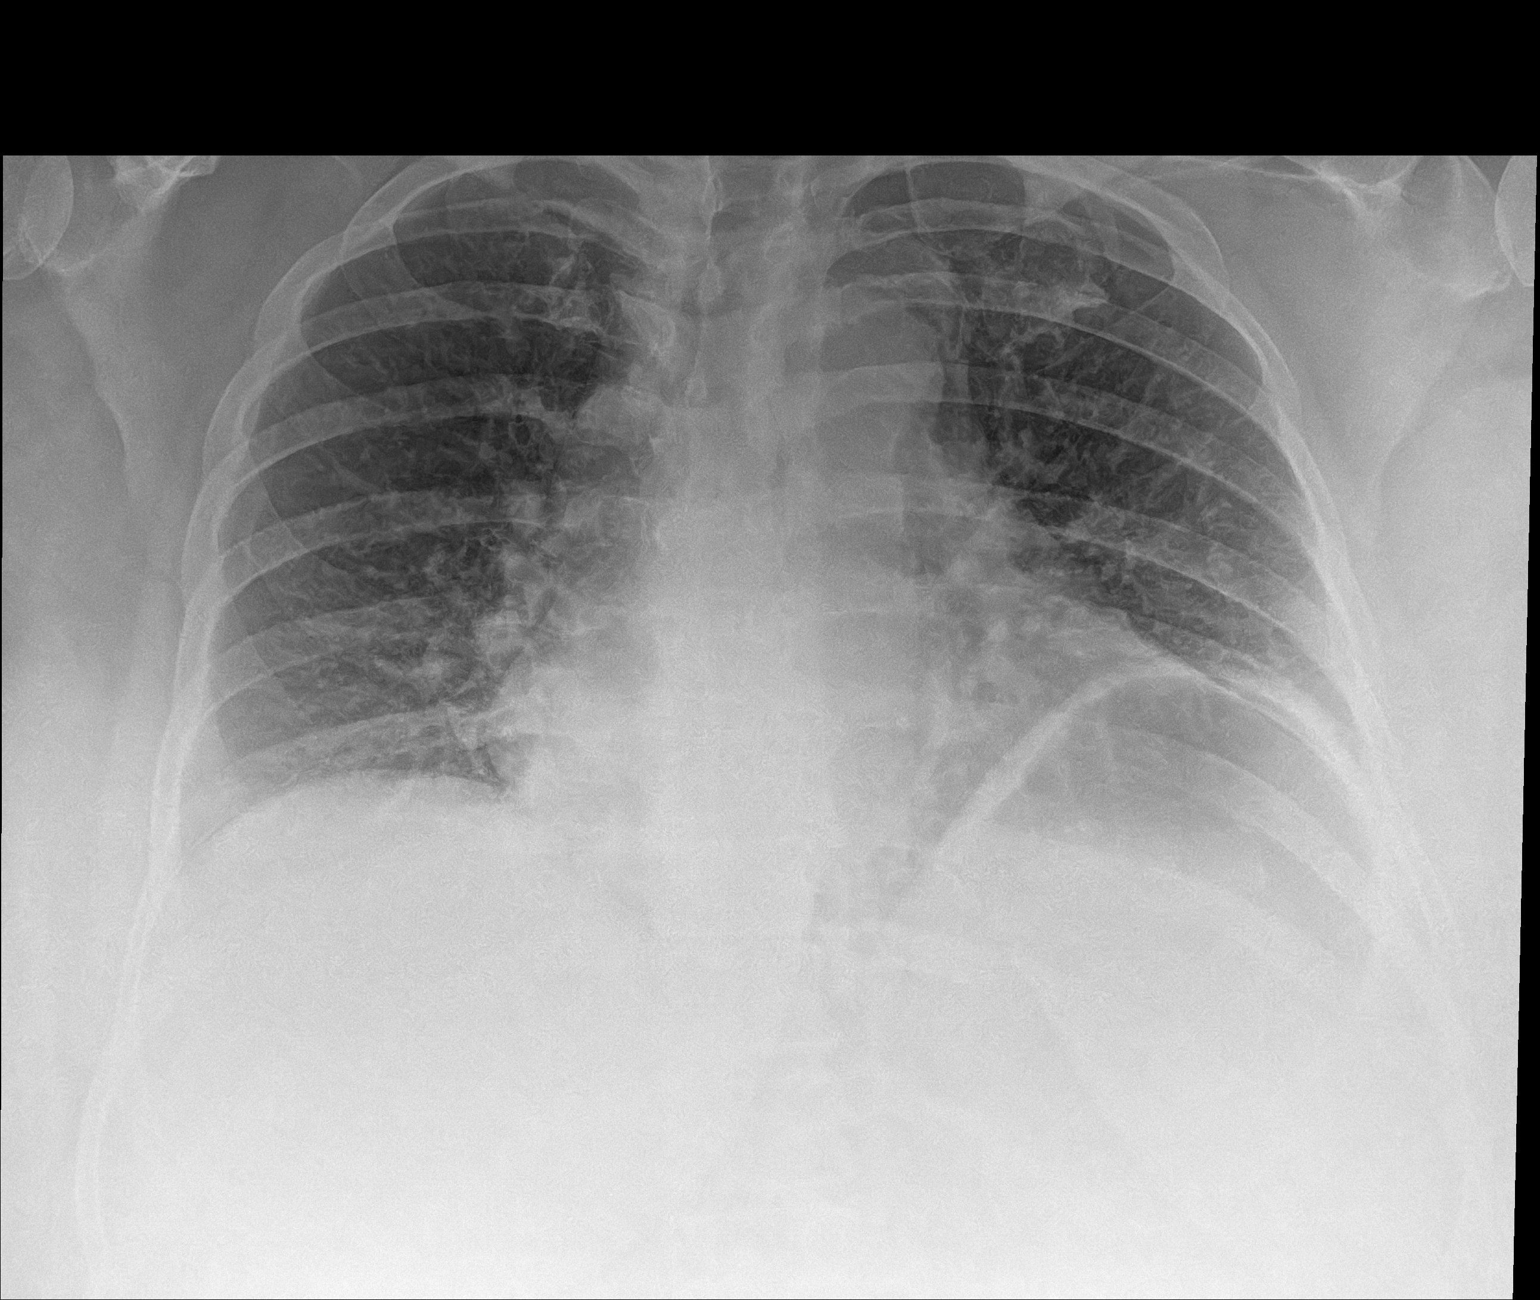

[1 of 1 positions shown; findings below may reference images not displayed]

FINDINGS: Shallow lung inflation. No pulmonary edema or focal airspace
consolidation. Normal pleural spaces. Mild cardiomegaly.
IMPRESSION: Shallow lung inflation and mild cardiomegaly.

## 2020-08-22 IMAGING — CT CT CHEST W/O CM
2 of 4 series · 15 of 36 positions shown, 18 images · non-contrast
Comparison: None.

CLINICAL DATA: Hypertension, chest pain

EXAM:
CT CHEST WITHOUT CONTRAST
TECHNIQUE: Multidetector CT imaging of the chest was performed following the
standard protocol without IV contrast.

[Series 4: routine chest without · axial · non-contrast · 0.70mm/px · z∈[-730,-468]mm · 12 of 155 slices shown, 15 images]
[im 12/155  mediastinal]
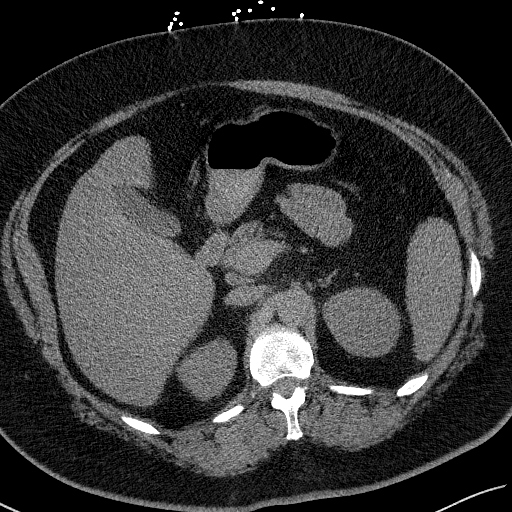
[im 12/155  lung]
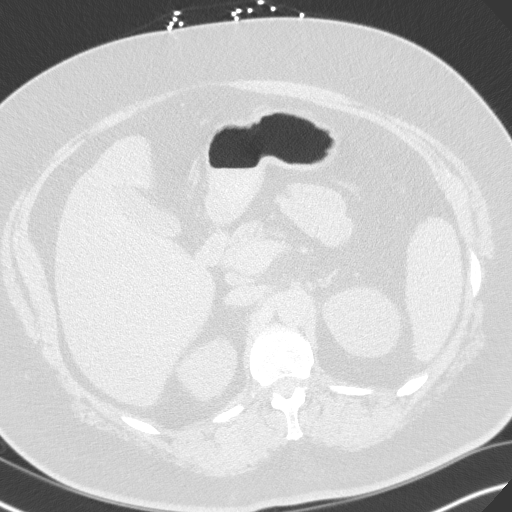
[im 24/155  lung]
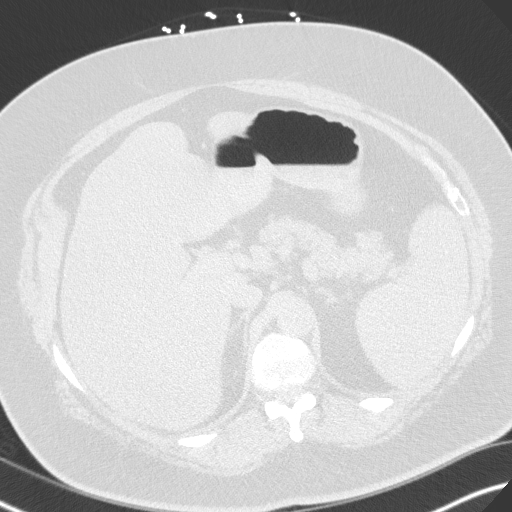
[im 36/155  lung]
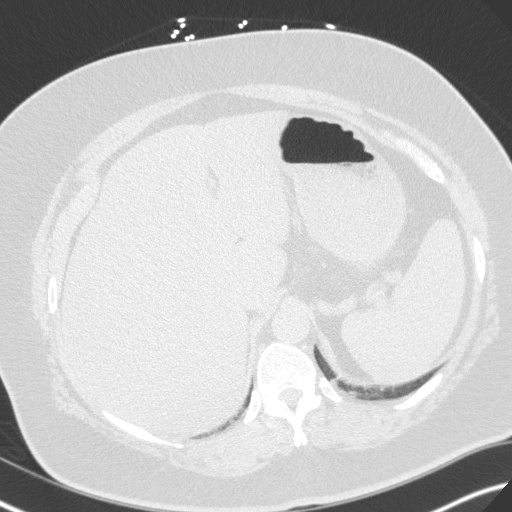
[im 48/155  lung]
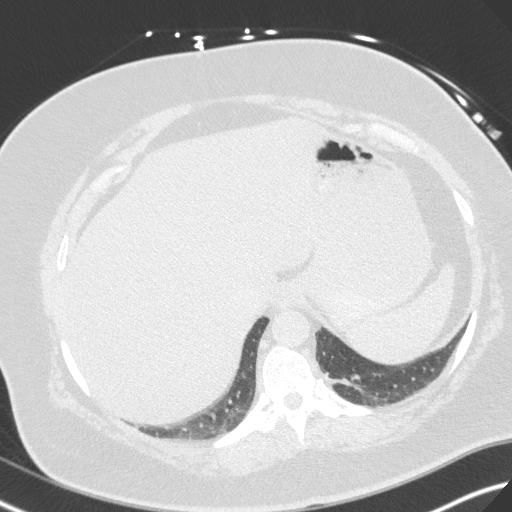
[im 60/155  mediastinal]
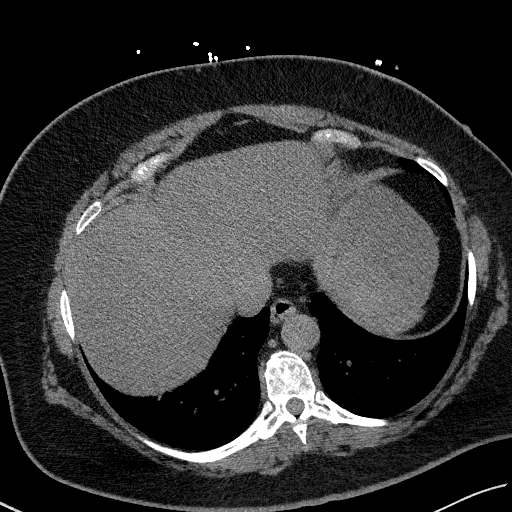
[im 60/155  lung]
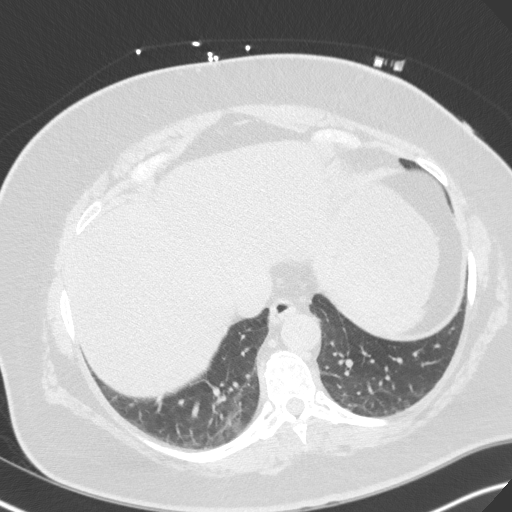
[im 72/155  lung]
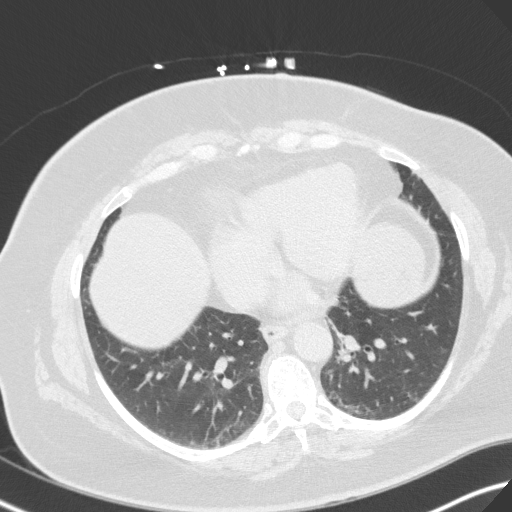
[im 83/155  lung]
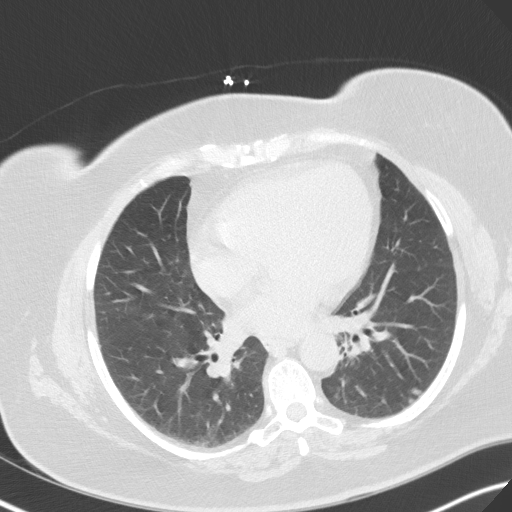
[im 95/155  lung]
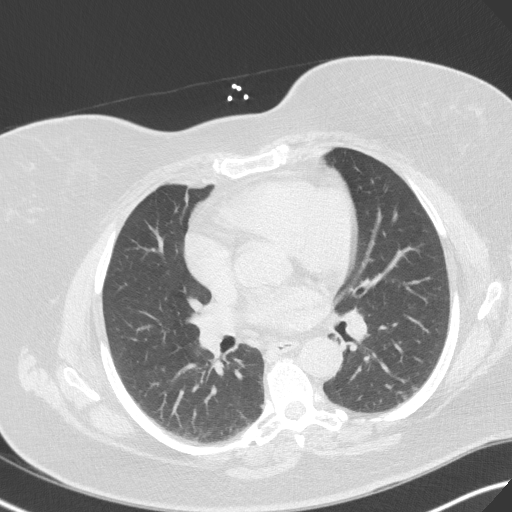
[im 107/155  mediastinal]
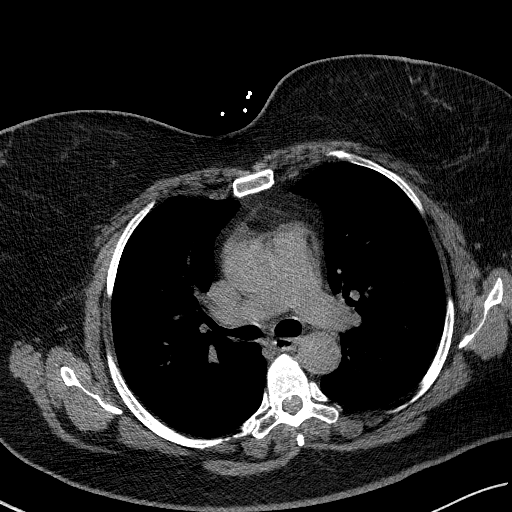
[im 107/155  lung]
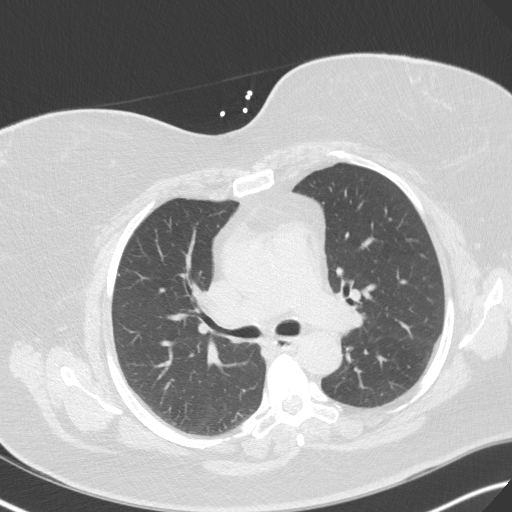
[im 119/155  lung]
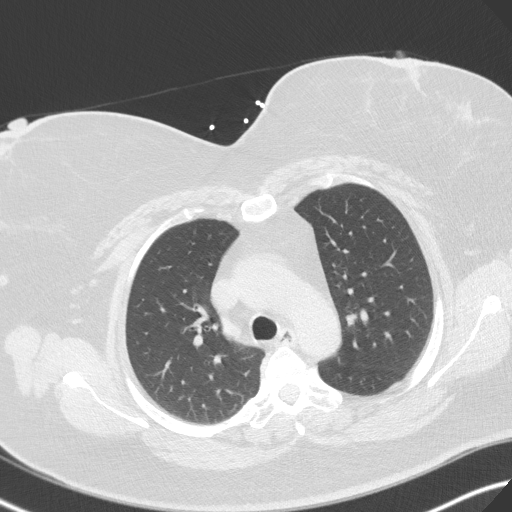
[im 131/155  lung]
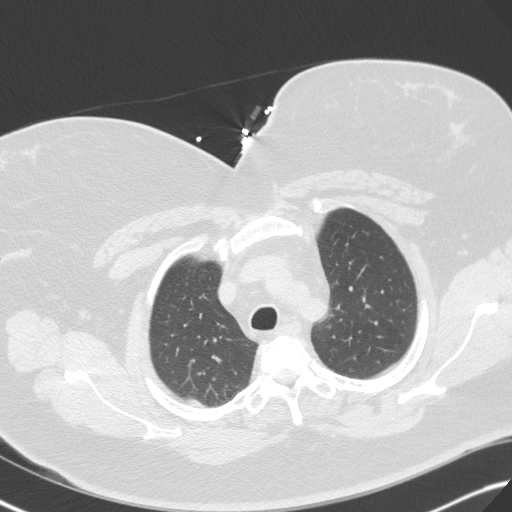
[im 143/155  lung]
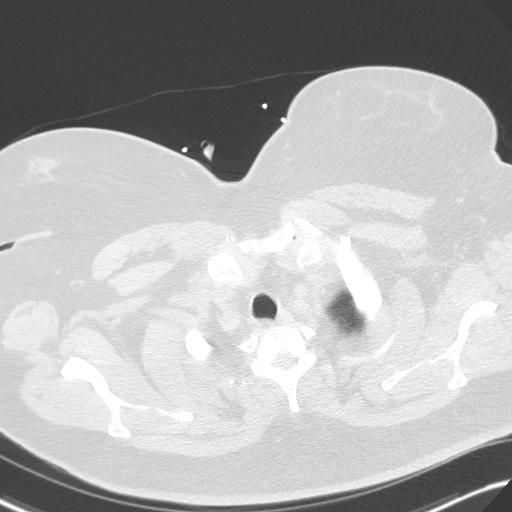

[Series 6: coronal · coronal · 0.60mm/px · 3 of 168 slices shown]
[im 34/168  lung]
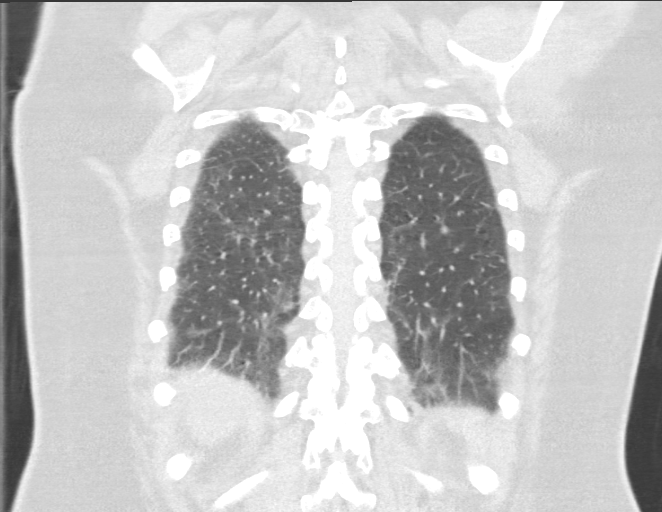
[im 67/168  lung]
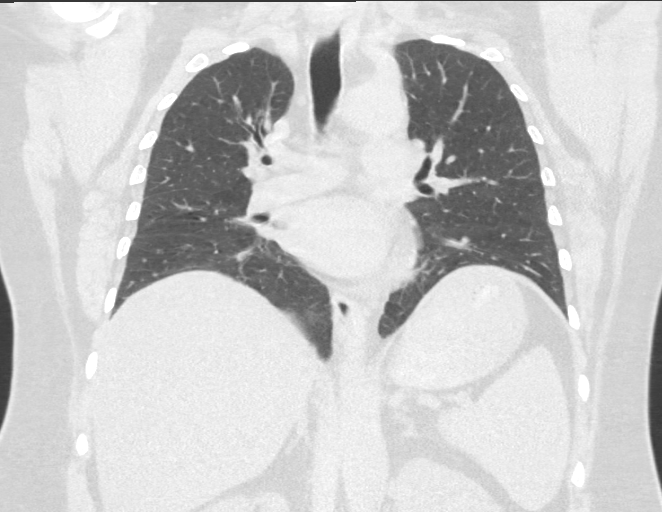
[im 101/168  lung]
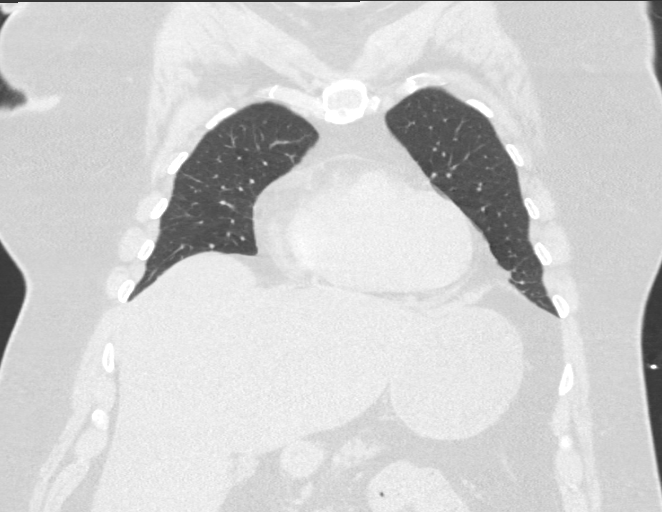

[15 of 36 positions shown; findings below may reference images not displayed]

FINDINGS: Cardiovascular: No significant coronary artery calcification. Global
cardiac size within normal limits. No pericardial effusion. The
central pulmonary arteries are of normal caliber. Small fluid seen
within the superior pericardial recess, physiologic. The thoracic
aorta is of normal caliber. No significant atherosclerotic
calcification within the thoracic aorta.

Mediastinum/Nodes: No enlarged mediastinal or axillary lymph nodes.
Thyroid gland, trachea, and esophagus demonstrate no significant
findings.

Lungs/Pleura: Lungs are clear. No pleural effusion or pneumothorax.
The central airways are widely patent.

Upper Abdomen: No acute abnormality.

Musculoskeletal: The osseous structures are age-appropriate. No
acute bone abnormality identified.
IMPRESSION: No acute intrathoracic pathology identified. No definite
radiographic explanation for the patient's reported symptoms.

## 2020-08-22 IMAGING — CT CT HEAD W/O CM
4 series · 16 of 47 positions shown, 18 images · non-contrast
Comparison: None.

CLINICAL DATA: Facial pain

EXAM:
CT HEAD WITHOUT CONTRAST
TECHNIQUE: Contiguous axial images were obtained from the base of the skull
through the vertex without intravenous contrast.

[Series 2: head bone · axial · 0.45mm/px · z∈[-104,-72]mm · 3 of 80 slices shown]
[im 8/80  bone]
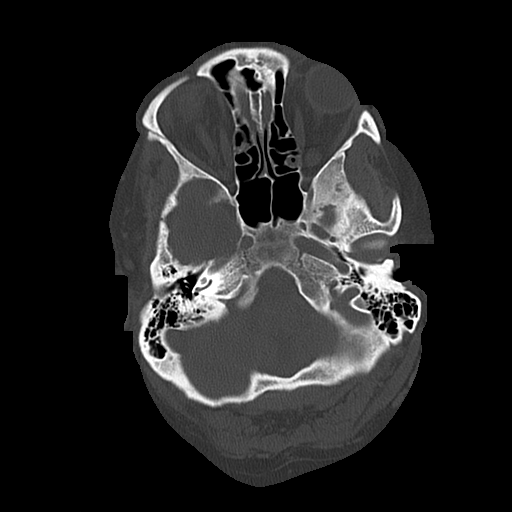
[im 16/80  bone]
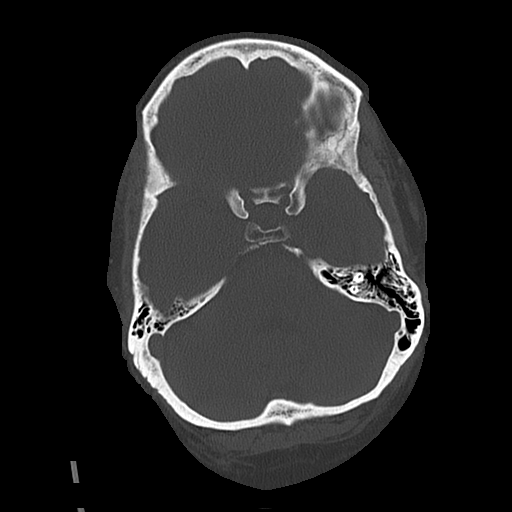
[im 24/80  bone]
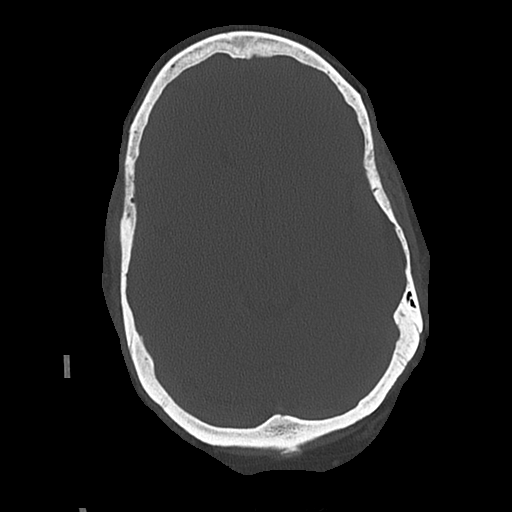

[Series 3: head wo · axial · 0.45mm/px · z∈[-103,+17]mm · 7 of 32 slices shown, 9 images]
[im 4/32  brain]
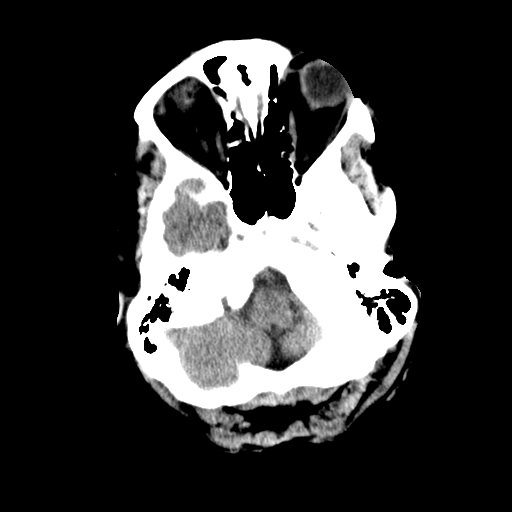
[im 4/32  bone]
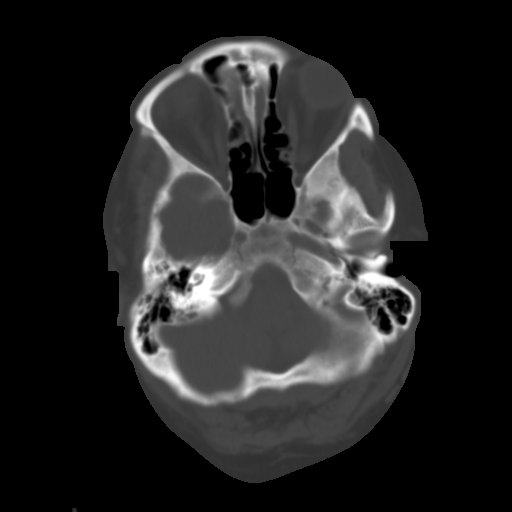
[im 8/32  brain]
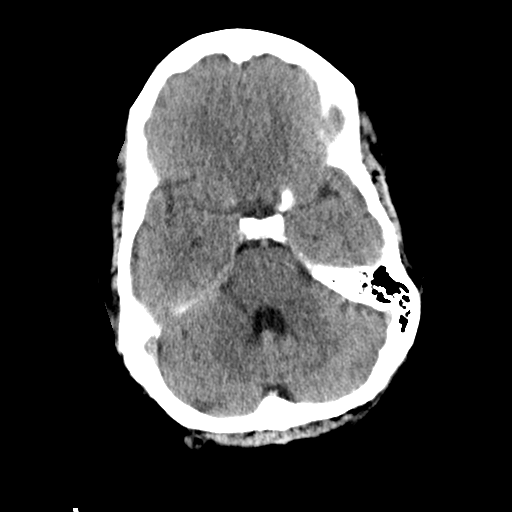
[im 12/32  brain]
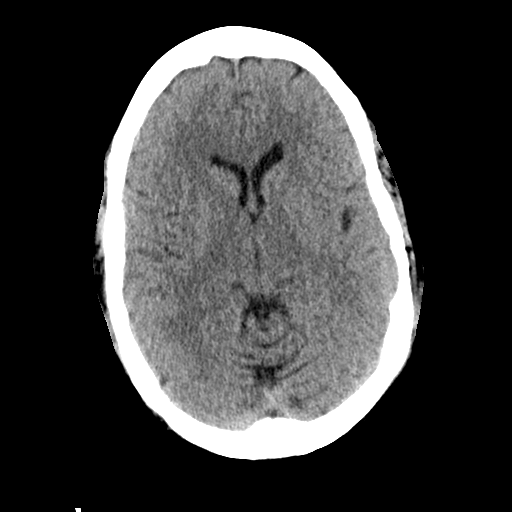
[im 16/32  brain]
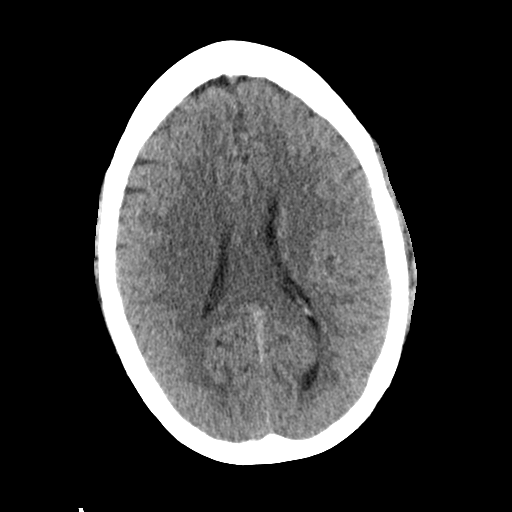
[im 20/32  brain]
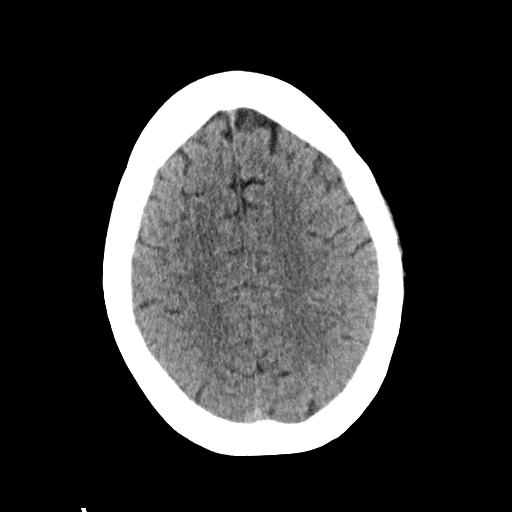
[im 20/32  bone]
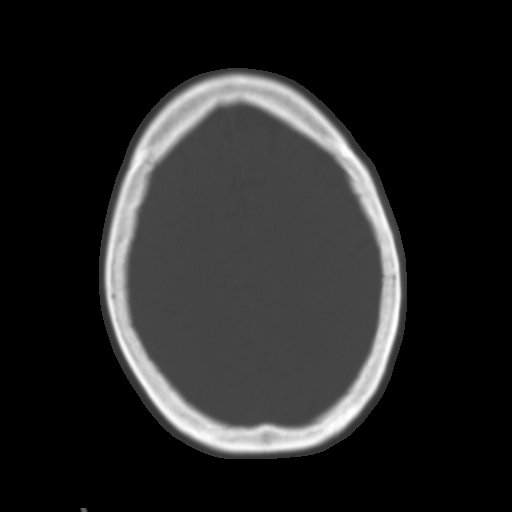
[im 24/32  brain]
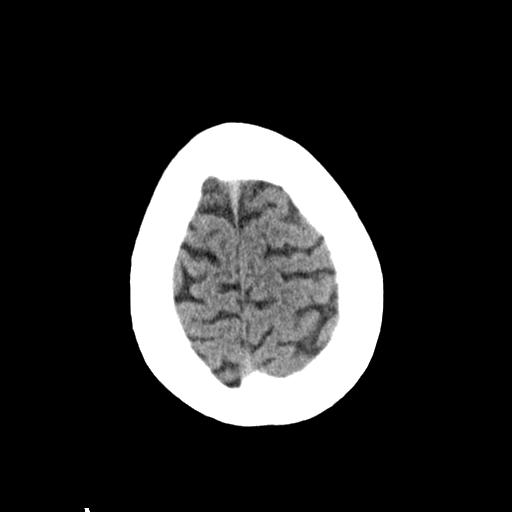
[im 28/32  brain]
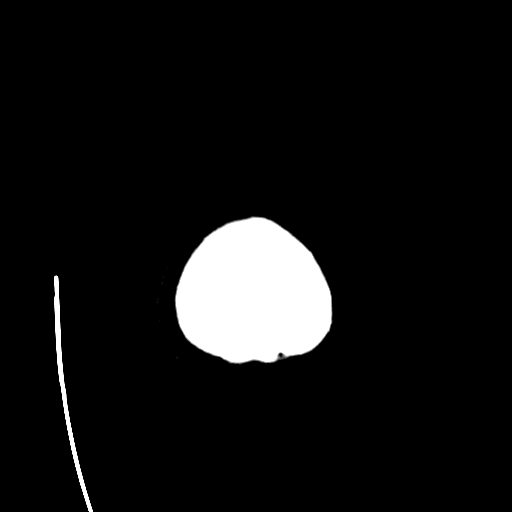

[Series 4: coronal soft · coronal · 0.30mm/px · 3 of 68 slices shown]
[im 23/68  brain]
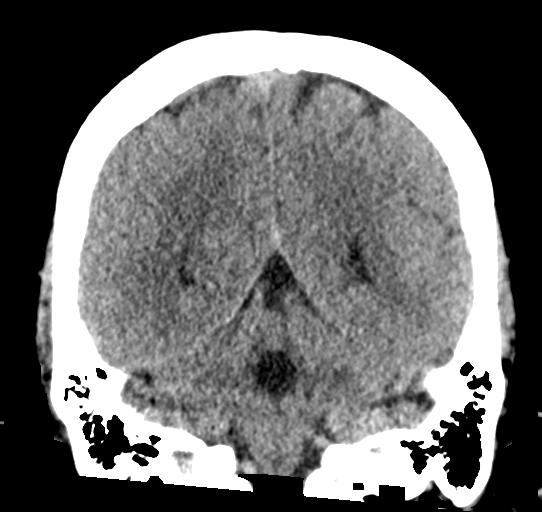
[im 30/68  brain]
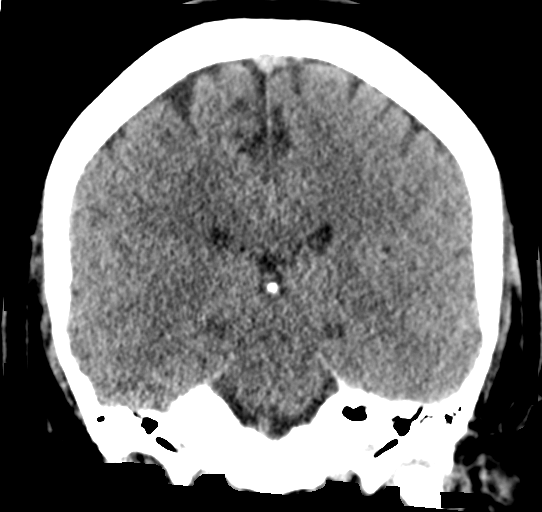
[im 38/68  brain]
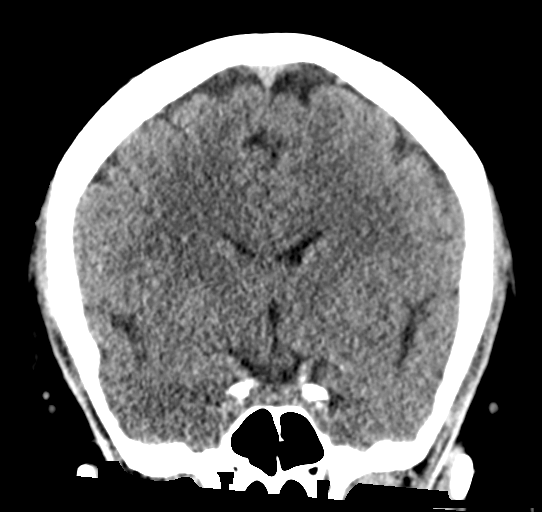

[Series 5: sagittal soft · sagittal · 0.30mm/px · 3 of 54 slices shown]
[im 18/54  brain]
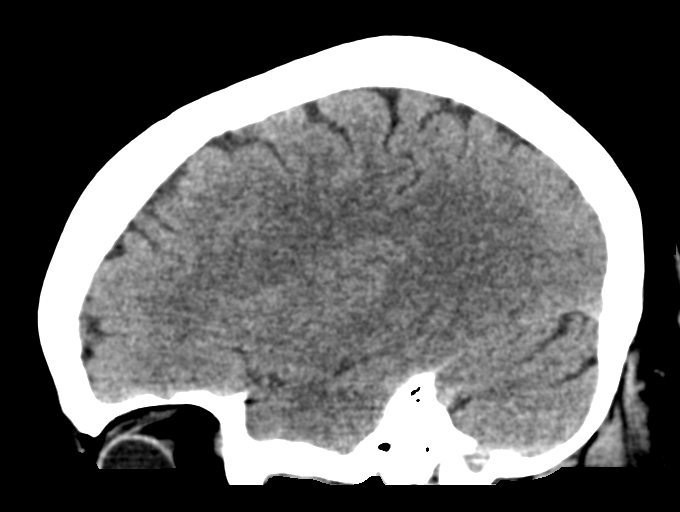
[im 27/54  brain]
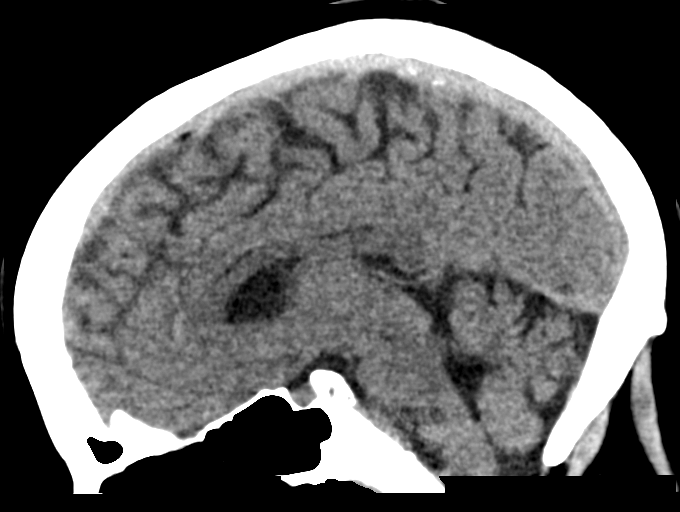
[im 36/54  brain]
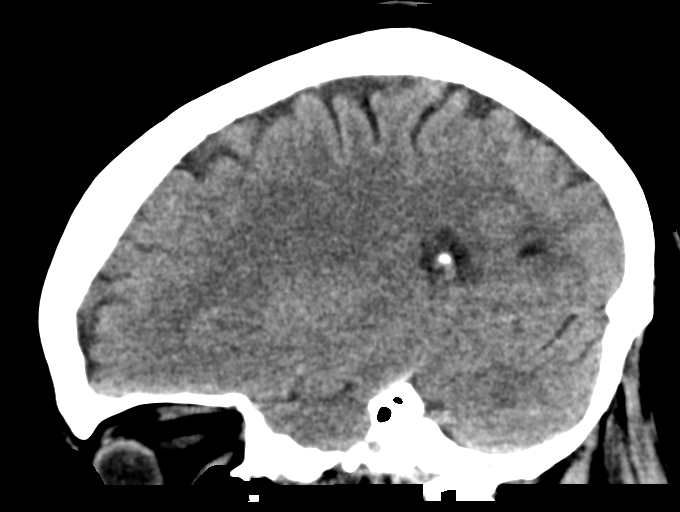

[16 of 47 positions shown; findings below may reference images not displayed]

FINDINGS: Brain: There is no mass, hemorrhage or extra-axial collection. The
size and configuration of the ventricles and extra-axial CSF spaces
are normal. The brain parenchyma is normal, without acute or chronic
infarction.

Vascular: No abnormal hyperdensity of the major intracranial
arteries or dural venous sinuses. No intracranial atherosclerosis.

Skull: The visualized skull base, calvarium and extracranial soft
tissues are normal.

Sinuses/Orbits: No fluid levels or advanced mucosal thickening of
the visualized paranasal sinuses. No mastoid or middle ear effusion.
The orbits are normal.
IMPRESSION: Normal head CT.

## 2020-08-22 MED ORDER — IBUPROFEN 600 MG PO TABS: 600.0000 mg | ORAL_TABLET | Freq: Four times a day (QID) | ORAL | 0 refills | Status: DC | PRN

## 2020-08-22 MED ORDER — MAGNESIUM SULFATE 2 GM/50ML IV SOLN
2.0000 g | Freq: Once | INTRAVENOUS | Status: AC
  Administered 2020-08-22: 2 g via INTRAVENOUS
  Filled 2020-08-22: qty 50

## 2020-08-22 MED ORDER — HYDROCHLOROTHIAZIDE 25 MG PO TABS: 25.0000 mg | ORAL_TABLET | Freq: Every day | ORAL | 0 refills | Status: DC

## 2020-08-22 MED ORDER — DIVALPROEX SODIUM 250 MG PO DR TAB
500.0000 mg | DELAYED_RELEASE_TABLET | ORAL | Status: AC
  Administered 2020-08-22: 500 mg via ORAL
  Filled 2020-08-22: qty 2

## 2020-08-22 NOTE — ED Provider Notes (Addendum)
[facility] Provider Note   CSN: [id number] Arrival date & time: 08/21/20  2338     History Chief Complaint  Patient presents with  . Headache  . Hypertension    Gail [name] is a 50 y.o. female.  The history is provided by the patient.  Headache Pain location:  Frontal Quality:  Dull Radiates to:  Does not radiate Severity currently:  10/10 Severity at highest:  10/10 Onset quality:  Gradual Timing:  Constant Progression:  Waxing and waning Chronicity:  New Context: not caffeine, not eating and not loud noise   Relieved by:  Nothing Worsened by:  Nothing Ineffective treatments:  None tried Associated symptoms: URI   Associated symptoms: no abdominal pain, no back pain, no dizziness, no eye pain, no fever, no focal weakness, no myalgias, no neck pain, no neck stiffness, no numbness, no photophobia and no weakness   Associated symptoms comment:  Cough congestion Risk factors: no anger   Hypertension This is a new problem. The problem occurs constantly. The problem has not changed since onset.Associated symptoms include headaches. Pertinent negatives include no chest pain, no abdominal pain and no shortness of breath. Nothing aggravates the symptoms. Nothing relieves the symptoms. She has tried nothing for the symptoms. The treatment provided no relief.  Patient with DM presents with recurrent HTN and headaches.  She states the headaches are ongoing and worse in the evening and after the headache comes on her BP is worse.  She was seen at urgent care and given clonidine and then here and given a migraine cocktail.  Upon D/c she went home and the headache got worse and the BP was elevated again.  No CP, no SOB, no n/v/d.  No weakness, no numbness.  No changes in vision, speech or cognition.  No f/c/r.  No neck pain nor stiffness.  No rashes on the skin.      Past Medical History:  Diagnosis Date  . Anemia   . Anxiety   . Depression   .  Diabetes mellitus without complication ([place])    type 2  . Dyspnea   . Hearing loss    wears hearing aids bilateral  . History of allergic rhinitis   . History of anxiety   . History of blood transfusion    [facility] 09/2016 2 units transfused  . History of depression   . History of hyperlipidemia   . History of syncope   . Seizures ([place])    hx seizures as a child,no seizures since age 8 yrs old  . Sleep apnea    uses mouth guard at night    Patient Active Problem List   Diagnosis Date Noted  . Epigastric pain 08/26/2019  . Screening mammogram, encounter for 10/22/2018  . PCOS (polycystic ovarian syndrome) 09/26/2017  . Status post total abdominal hysterectomy 12/19/2016  . Insomnia 07/22/2016  . Auditory processing disorder 04/04/2016  . Encounter for routine gynecological examination 11/27/2014  . Morbid obesity ([place]) 12/13/2012  . Prediabetes 12/13/2012  . Routine general medical examination at a health care facility 12/04/2011  . HYPERTRIGLYCERIDEMIA 04/07/2008  . PROBLEMS WITH HEARING 04/07/2008  . Anxiety disorder 02/07/2008  . ALLERGIC RHINITIS 02/07/2008  . HYPOGLYCEMIA, REACTIVE 11/15/2006    Past Surgical History:  Procedure Laterality Date  . [place]   x 2  . CYSTOSCOPY N/A 12/19/2016   Procedure: CYSTOSCOPY;  Surgeon: Nunzio Cobbs, MD;  Location: [facility];  Service: Gynecology;  Laterality: N/A;  . TOTAL LAPAROSCOPIC HYSTERECTOMY WITH SALPINGECTOMY Bilateral 12/19/2016   Procedure: HYSTERECTOMY TOTAL LAPAROSCOPIC WITH SALPINGECTOMY;  Surgeon: Nunzio Cobbs, MD;  Location: [facility];  Service: Gynecology;  Laterality: Bilateral;  . WISDOM TOOTH EXTRACTION       OB History    Gravida  2   Para  2   Term  0   Preterm  0   AB  0   Living  2     SAB  0   IAB  0   Ectopic  0   Multiple  0   Live Births  0           Family History  Problem Relation Age of Onset  . Diabetes Father   . Diabetes Paternal  Grandfather   . Prostate cancer Paternal Grandfather   . Diabetes Paternal Grandmother     Social History   Tobacco Use  . Smoking status: Never Smoker  . Smokeless tobacco: Never Used  Vaping Use  . Vaping Use: Never used  Substance Use Topics  . Alcohol use: Yes    Alcohol/week: 0.0 standard drinks    Comment: occasional  winie  . Drug use: No    Home Medications Prior to Admission medications   Medication Sig Start Date End Date Taking? Authorizing Provider  hydrochlorothiazide (HYDRODIURIL) 25 MG tablet Take 1 tablet (25 mg total) by mouth daily. 08/22/20  Yes Benjamine Strout, MD  ibuprofen (ADVIL) 600 MG tablet Take 1 tablet (600 mg total) by mouth every 6 (six) hours as needed. 08/22/20  Yes Chloeann Alfred, MD  atorvastatin (LIPITOR) 20 MG tablet Take 1 tablet (20 mg total) by mouth at bedtime. 11/04/19   Tower, Wynelle Fanny, MD  Choline Fenofibrate (FENOFIBRIC ACID) 135 MG CPDR Take 135 mg by mouth at bedtime. 11/04/19   Tower, Wynelle Fanny, MD  doxycycline (VIBRAMYCIN) 100 MG capsule Take 1 capsule (100 mg total) by mouth 2 (two) times daily for 7 days. 08/20/20 08/27/20  Arnaldo Natal, MD  escitalopram (LEXAPRO) 10 MG tablet Take 1 tablet (10 mg total) by mouth daily. 11/04/19   Tower, Wynelle Fanny, MD  miglitol (GLYSET) 25 MG tablet Take 1 tablet (25 mg total) by mouth 2 (two) times daily. 11/04/19   Tower, Wynelle Fanny, MD  ONETOUCH VERIO test strip USE TO CHECK BLOOD SUGAR ONCE DAILY AS NEEDED 10/07/19   Tower, Wynelle Fanny, MD    Allergies    Influenza vaccine recombinant and Penicillins  Review of Systems   Review of Systems  Constitutional: Negative for fever.  HENT: Negative for drooling and facial swelling.   Eyes: Negative for photophobia and pain.  Respiratory: Negative for shortness of breath and wheezing.   Cardiovascular: Negative for chest pain and leg swelling.  Gastrointestinal: Negative for abdominal pain.  Genitourinary: Negative for dysuria.  Musculoskeletal: Negative for back  pain, myalgias, neck pain and neck stiffness.  Skin: Negative for rash.  Neurological: Positive for headaches. Negative for dizziness, focal weakness, facial asymmetry, speech difficulty, weakness and numbness.  Psychiatric/Behavioral: Negative for agitation and confusion.  All other systems reviewed and are negative.   Physical Exam Updated Vital Signs BP (!) 145/80   Pulse 86   Temp 98.3 F (36.8 C)   Resp 16   Ht 5\' 4"  (1.626 m)   Wt 113.4 kg   LMP 12/19/2016 (Exact Date)   SpO2 94%   BMI 42.91 kg/m   Physical Exam Vitals and nursing note  reviewed.  Constitutional:      General: She is not in acute distress.    Appearance: Normal appearance. She is not diaphoretic.  HENT:     Head: Normocephalic and atraumatic.     Nose: Nose normal.     Mouth/Throat:     Comments: No proptosis, intact cognition, disk margins sharp Eyes:     Conjunctiva/sclera: Conjunctivae normal.     Pupils: Pupils are equal, round, and reactive to light.  Cardiovascular:     Rate and Rhythm: Normal rate and regular rhythm.     Pulses: Normal pulses.     Heart sounds: Normal heart sounds.  Pulmonary:     Effort: Pulmonary effort is normal.     Breath sounds: Normal breath sounds.  Abdominal:     General: Abdomen is flat. Bowel sounds are normal.     Palpations: Abdomen is soft.     Tenderness: There is no abdominal tenderness. There is no guarding.  Musculoskeletal:        General: Normal range of motion.     Cervical back: Normal range of motion and neck supple.  Skin:    General: Skin is warm and dry.     Capillary Refill: Capillary refill takes less than 2 seconds.  Neurological:     General: No focal deficit present.     Mental Status: She is alert and oriented to person, place, and time.     Cranial Nerves: No cranial nerve deficit.     Deep Tendon Reflexes: Reflexes normal.  Psychiatric:        Mood and Affect: Mood normal.        Behavior: Behavior normal.     ED Results /  Procedures / Treatments   Labs (all labs ordered are listed, but only abnormal results are displayed) Results for orders placed or performed during the hospital encounter of 08/21/20  Resp Panel by RT-PCR (Flu A&B, Covid)  Result Value Ref Range   SARS Coronavirus 2 by RT PCR NEGATIVE NEGATIVE   Influenza A by PCR NEGATIVE NEGATIVE   Influenza B by PCR NEGATIVE NEGATIVE  CBC with Differential/Platelet  Result Value Ref Range   WBC 6.1 4.0 - 10.5 K/uL   RBC 4.91 3.87 - 5.11 MIL/uL   Hemoglobin 14.0 12.0 - 15.0 g/dL   HCT 41.9 36.0 - 46.0 %   MCV 85.3 80.0 - 100.0 fL   MCH 28.5 26.0 - 34.0 pg   MCHC 33.4 30.0 - 36.0 g/dL   RDW 13.0 11.5 - 15.5 %   Platelets 265 150 - 400 K/uL   nRBC 0.0 0.0 - 0.2 %   Neutrophils Relative % 45 %   Neutro Abs 2.8 1.7 - 7.7 K/uL   Lymphocytes Relative 44 %   Lymphs Abs 2.7 0.7 - 4.0 K/uL   Monocytes Relative 6 %   Monocytes Absolute 0.4 0.1 - 1.0 K/uL   Eosinophils Relative 2 %   Eosinophils Absolute 0.1 0.0 - 0.5 K/uL   Basophils Relative 1 %   Basophils Absolute 0.0 0.0 - 0.1 K/uL   Immature Granulocytes 2 %   Abs Immature Granulocytes 0.09 (H) 0.00 - 0.07 K/uL  Comprehensive metabolic panel  Result Value Ref Range   Sodium 140 135 - 145 mmol/L   Potassium 3.5 3.5 - 5.1 mmol/L   Chloride 103 98 - 111 mmol/L   CO2 26 22 - 32 mmol/L   Glucose, Bld 187 (H) 70 - 99 mg/dL   BUN 17  6 - 20 mg/dL   Creatinine, Ser 0.75 0.44 - 1.00 mg/dL   Calcium 8.7 (L) 8.9 - 10.3 mg/dL   Total Protein 7.0 6.5 - 8.1 g/dL   Albumin 4.3 3.5 - 5.0 g/dL   AST 40 15 - 41 U/L   ALT 35 0 - 44 U/L   Alkaline Phosphatase 56 38 - 126 U/L   Total Bilirubin 0.4 0.3 - 1.2 mg/dL   GFR, Estimated >60 >60 mL/min   Anion gap 11 5 - 15  Urinalysis, Routine w reflex microscopic Urine, Clean Catch  Result Value Ref Range   Color, Urine COLORLESS (A) YELLOW   APPearance CLEAR CLEAR   Specific Gravity, Urine <1.005 (L) 1.005 - 1.030   pH 6.5 5.0 - 8.0   Glucose, UA NEGATIVE  NEGATIVE mg/dL   Hgb urine dipstick NEGATIVE NEGATIVE   Bilirubin Urine NEGATIVE NEGATIVE   Ketones, ur NEGATIVE NEGATIVE mg/dL   Protein, ur NEGATIVE NEGATIVE mg/dL   Nitrite NEGATIVE NEGATIVE   Leukocytes,Ua NEGATIVE NEGATIVE  Pregnancy, urine  Result Value Ref Range   Preg Test, Ur NEGATIVE NEGATIVE   DG Chest 1 View  Result Date: 08/22/2020 CLINICAL DATA:  Headache and hypertension EXAM: CHEST  1 VIEW COMPARISON:  None. FINDINGS: Shallow lung inflation. No pulmonary edema or focal airspace consolidation. Normal pleural spaces. Mild cardiomegaly. IMPRESSION: Shallow lung inflation and mild cardiomegaly. Electronically Signed   By: Ulyses Jarred M.D.   On: 08/22/2020 01:11   CT Head Wo Contrast  Result Date: 08/22/2020 CLINICAL DATA:  Facial pain EXAM: CT HEAD WITHOUT CONTRAST TECHNIQUE: Contiguous axial images were obtained from the base of the skull through the vertex without intravenous contrast. COMPARISON:  None. FINDINGS: Brain: There is no mass, hemorrhage or extra-axial collection. The size and configuration of the ventricles and extra-axial CSF spaces are normal. The brain parenchyma is normal, without acute or chronic infarction. Vascular: No abnormal hyperdensity of the major intracranial arteries or dural venous sinuses. No intracranial atherosclerosis. Skull: The visualized skull base, calvarium and extracranial soft tissues are normal. Sinuses/Orbits: No fluid levels or advanced mucosal thickening of the visualized paranasal sinuses. No mastoid or middle ear effusion. The orbits are normal. IMPRESSION: Normal head CT. Electronically Signed   By: Ulyses Jarred M.D.   On: 08/22/2020 02:17   CT Chest Wo Contrast  Result Date: 08/22/2020 CLINICAL DATA:  Hypertension, chest pain EXAM: CT CHEST WITHOUT CONTRAST TECHNIQUE: Multidetector CT imaging of the chest was performed following the standard protocol without IV contrast. COMPARISON:  None. FINDINGS: Cardiovascular: No significant  coronary artery calcification. Global cardiac size within normal limits. No pericardial effusion. The central pulmonary arteries are of normal caliber. Small fluid seen within the superior pericardial recess, physiologic. The thoracic aorta is of normal caliber. No significant atherosclerotic calcification within the thoracic aorta. Mediastinum/Nodes: No enlarged mediastinal or axillary lymph nodes. Thyroid gland, trachea, and esophagus demonstrate no significant findings. Lungs/Pleura: Lungs are clear. No pleural effusion or pneumothorax. The central airways are widely patent. Upper Abdomen: No acute abnormality. Musculoskeletal: The osseous structures are age-appropriate. No acute bone abnormality identified. IMPRESSION: No acute intrathoracic pathology identified. No definite radiographic explanation for the patient's reported symptoms. Electronically Signed   By: Fidela Salisbury MD   On: 08/22/2020 01:36    EKG EKG Interpretation  Date/Time:  Sunday August 22 2020 00:25:58 EDT Ventricular Rate:  81 PR Interval:  140 QRS Duration: 115 QT Interval:  393 QTC Calculation: 457 R Axis:   37 Text  Interpretation: Sinus rhythm Baseline wander in lead(s) V6 Confirmed by Randal Buba, Taia Bramlett ([id number]) on 08/22/2020 1:46:45 AM   Radiology DG Chest 1 View  Result Date: 08/22/2020 CLINICAL DATA:  Headache and hypertension EXAM: CHEST  1 VIEW COMPARISON:  None. FINDINGS: Shallow lung inflation. No pulmonary edema or focal airspace consolidation. Normal pleural spaces. Mild cardiomegaly. IMPRESSION: Shallow lung inflation and mild cardiomegaly. Electronically Signed   By: Ulyses Jarred M.D.   On: 08/22/2020 01:11   CT Head Wo Contrast  Result Date: 08/22/2020 CLINICAL DATA:  Facial pain EXAM: CT HEAD WITHOUT CONTRAST TECHNIQUE: Contiguous axial images were obtained from the base of the skull through the vertex without intravenous contrast. COMPARISON:  None. FINDINGS: Brain: There is no mass, hemorrhage or extra-axial  collection. The size and configuration of the ventricles and extra-axial CSF spaces are normal. The brain parenchyma is normal, without acute or chronic infarction. Vascular: No abnormal hyperdensity of the major intracranial arteries or dural venous sinuses. No intracranial atherosclerosis. Skull: The visualized skull base, calvarium and extracranial soft tissues are normal. Sinuses/Orbits: No fluid levels or advanced mucosal thickening of the visualized paranasal sinuses. No mastoid or middle ear effusion. The orbits are normal. IMPRESSION: Normal head CT. Electronically Signed   By: Ulyses Jarred M.D.   On: 08/22/2020 02:17   CT Chest Wo Contrast  Result Date: 08/22/2020 CLINICAL DATA:  Hypertension, chest pain EXAM: CT CHEST WITHOUT CONTRAST TECHNIQUE: Multidetector CT imaging of the chest was performed following the standard protocol without IV contrast. COMPARISON:  None. FINDINGS: Cardiovascular: No significant coronary artery calcification. Global cardiac size within normal limits. No pericardial effusion. The central pulmonary arteries are of normal caliber. Small fluid seen within the superior pericardial recess, physiologic. The thoracic aorta is of normal caliber. No significant atherosclerotic calcification within the thoracic aorta. Mediastinum/Nodes: No enlarged mediastinal or axillary lymph nodes. Thyroid gland, trachea, and esophagus demonstrate no significant findings. Lungs/Pleura: Lungs are clear. No pleural effusion or pneumothorax. The central airways are widely patent. Upper Abdomen: No acute abnormality. Musculoskeletal: The osseous structures are age-appropriate. No acute bone abnormality identified. IMPRESSION: No acute intrathoracic pathology identified. No definite radiographic explanation for the patient's reported symptoms. Electronically Signed   By: Fidela Salisbury MD   On: 08/22/2020 01:36    Procedures Procedures   Medications Ordered in ED Medications  acetaminophen  (TYLENOL) tablet 1,000 mg (1,000 mg Oral Given 08/22/20 0015)  amLODipine (NORVASC) tablet 5 mg (5 mg Oral Given 08/22/20 0015)  magnesium sulfate IVPB 2 g 50 mL (0 g Intravenous Stopped 08/22/20 0101)  divalproex (DEPAKOTE) DR tablet 500 mg (500 mg Oral Given 08/22/20 0139)    ED Course  I have reviewed the triage vital signs and the nursing notes.  Pertinent labs & imaging results that were available during my care of the patient were reviewed by me and considered in my medical decision making (see chart for details).    Chest CT ordered as concern for CHF on CXR and also to look for coarctation of the aorta and this is negative.  I believe this patient has high blood pressure and will need ongoing testing and treatment by her PMD.  I do not believe this is isolated.  Patient has been counseled on adequate water intake and avoiding salt, alcohol, and caffeine.  BP medication sent to pharmacy until patient can be seen by PMD.  . I suspect the clonidine lowered the Bp and then the patient had reflex hypertension when it wore off.  The headache may be a separate issue.  Patient has intact neuro exam. I do not believe this is meningitis as neck is not stiff and headaches have been going on for several days and there is no rash and the patient is well appearing sitting in the room with the lights one.  I do not believe this is a cavernous sinus thrombosis as the symptoms are ongoing and CT is normal and there is no proptosis, disks are sharp and cognition is intact and there are no non verbal signs of pain.  I also do not believe this is an ICH, CT is normal as is the exam and the patient is not vomiting.  I will refer back to PMD as well as headache specialist for ongoing testing and treatment.  I suspect BP goes higher with pain and given anxiety the patient then ruminates on this adding to the issue.  COVID is also negative.    Kemiah T [name] was evaluated in Emergency Department on 08/22/2020 for the symptoms  described in the history of present illness. She was evaluated in the context of the global COVID-19 pandemic, which necessitated consideration that the patient might be at risk for infection with the SARS-CoV-2 virus that causes COVID-19. Institutional protocols and algorithms that pertain to the evaluation of patients at risk for COVID-19 are in a state of rapid change based on information released by regulatory bodies including the CDC and federal and state organizations. These policies and algorithms were followed during the patient's care in the ED.  Final Clinical Impression(s) / ED Diagnoses Final diagnoses:  Headache disorder  Elevated blood pressure reading   Return for intractable cough, coughing up blood, fevers >100.4 unrelieved by medication, shortness of breath, intractable vomiting, chest pain, shortness of breath, weakness, numbness, changes in speech, facial asymmetry, abdominal pain, passing out, Inability to tolerate liquids or food, cough, altered mental status or any concerns. No signs of systemic illness or infection. The patient is nontoxic-appearing on exam and vital signs are within normal limits.  I have reviewed the triage vital signs and the nursing notes. Pertinent labs & imaging results that were available during my care of the patient were reviewed by me and considered in my medical decision making (see chart for details). After history, exam, and medical workup I feel the patient has been appropriately medically screened and is safe for discharge home. Pertinent diagnoses were discussed with the patient. Patient was given return precautions.  Rx / DC Orders ED Discharge Orders         Ordered    ibuprofen (ADVIL) 600 MG tablet  Every 6 hours PRN        08/22/20 0226    hydrochlorothiazide (HYDRODIURIL) 25 MG tablet  Daily        08/22/20 0226           Zalaya Astarita, MD 08/22/20 Cowen, Alaya Iverson, MD 08/22/20 [id number]

## 2020-08-24 ENCOUNTER — Ambulatory Visit: Payer: BC Managed Care – PPO | Admitting: Family Medicine

## 2020-08-24 ENCOUNTER — Encounter: Payer: Self-pay | Admitting: Family Medicine

## 2020-08-24 ENCOUNTER — Other Ambulatory Visit: Payer: Self-pay

## 2020-08-24 VITALS — BP 134/84 | HR 95 | Temp 96.9°F | Ht 64.0 in | Wt 260.1 lb

## 2020-08-24 DIAGNOSIS — J069 Acute upper respiratory infection, unspecified: Secondary | ICD-10-CM | POA: Diagnosis not present

## 2020-08-24 DIAGNOSIS — R519 Headache, unspecified: Secondary | ICD-10-CM | POA: Diagnosis not present

## 2020-08-24 DIAGNOSIS — I10 Essential (primary) hypertension: Secondary | ICD-10-CM | POA: Diagnosis not present

## 2020-08-24 MED ORDER — PREDNISONE 10 MG PO TABS: ORAL_TABLET | ORAL | 0 refills | Status: DC

## 2020-08-24 NOTE — Assessment & Plan Note (Signed)
Improving with residual headache Recent ER visit-reassuring w/u Reviewed hospital records, lab results and studies in detail

## 2020-08-24 NOTE — Progress Notes (Signed)
Subjective:    Patient ID: Gail [name], female    DOB: 27-May-1970, 50 y.o.   MRN: [id number]  This visit occurred during the SARS-CoV-2 public health emergency.  Safety protocols were in place, including screening questions prior to the visit, additional usage of staff PPE, and extensive cleaning of exam room while observing appropriate contact time as indicated for disinfecting solutions.    HPI Pt presents for f/u of headache/HTN and ER visit for them  Wt Readings from Last 3 Encounters:  08/24/20 260 lb 1 oz (118 kg)  08/21/20 250 lb (113.4 kg)  08/20/20 250 lb (113.4 kg)   44.64 kg/m  She presented to ER on 6/4 and had been seen previously for elevated BP with headache and some uri symptoms  Had been to UC the day before and doxycycline px as well as given clonidine and migraine cocktail   ER and UC visit notes reviewed today in clinic   Flu and covid test neg Lab Results  Component Value Date   WBC 6.1 08/22/2020   HGB 14.0 08/22/2020   HCT 41.9 08/22/2020   MCV 85.3 08/22/2020   PLT 265 08/22/2020   Lab Results  Component Value Date   CREATININE 0.75 08/22/2020   BUN 17 08/22/2020   NA 140 08/22/2020   K 3.5 08/22/2020   CL 103 08/22/2020   CO2 26 08/22/2020   Lab Results  Component Value Date   ALT 35 08/22/2020   AST 40 08/22/2020   ALKPHOS 56 08/22/2020   BILITOT 0.4 08/22/2020  glucose was 187 non fasting   Sees Dr Forde Dandy for blood sugar  She did not tolerate the metformin  Was going to start something else - ?    ua clear (no protein or wbc) preg test neg  Imaging DG Chest 1 View  Result Date: 08/22/2020 CLINICAL DATA:  Headache and hypertension EXAM: CHEST  1 VIEW COMPARISON:  None. FINDINGS: Shallow lung inflation. No pulmonary edema or focal airspace consolidation. Normal pleural spaces. Mild cardiomegaly. IMPRESSION: Shallow lung inflation and mild cardiomegaly. Electronically Signed   By: Ulyses Jarred M.D.   On: 08/22/2020 01:11   CT  Head Wo Contrast  Result Date: 08/22/2020 CLINICAL DATA:  Facial pain EXAM: CT HEAD WITHOUT CONTRAST TECHNIQUE: Contiguous axial images were obtained from the base of the skull through the vertex without intravenous contrast. COMPARISON:  None. FINDINGS: Brain: There is no mass, hemorrhage or extra-axial collection. The size and configuration of the ventricles and extra-axial CSF spaces are normal. The brain parenchyma is normal, without acute or chronic infarction. Vascular: No abnormal hyperdensity of the major intracranial arteries or dural venous sinuses. No intracranial atherosclerosis. Skull: The visualized skull base, calvarium and extracranial soft tissues are normal. Sinuses/Orbits: No fluid levels or advanced mucosal thickening of the visualized paranasal sinuses. No mastoid or middle ear effusion. The orbits are normal. IMPRESSION: Normal head CT. Electronically Signed   By: Ulyses Jarred M.D.   On: 08/22/2020 02:17   CT Chest Wo Contrast  Result Date: 08/22/2020 CLINICAL DATA:  Hypertension, chest pain EXAM: CT CHEST WITHOUT CONTRAST TECHNIQUE: Multidetector CT imaging of the chest was performed following the standard protocol without IV contrast. COMPARISON:  None. FINDINGS: Cardiovascular: No significant coronary artery calcification. Global cardiac size within normal limits. No pericardial effusion. The central pulmonary arteries are of normal caliber. Small fluid seen within the superior pericardial recess, physiologic. The thoracic aorta is of normal caliber. No significant atherosclerotic calcification within the  thoracic aorta. Mediastinum/Nodes: No enlarged mediastinal or axillary lymph nodes. Thyroid gland, trachea, and esophagus demonstrate no significant findings. Lungs/Pleura: Lungs are clear. No pleural effusion or pneumothorax. The central airways are widely patent. Upper Abdomen: No acute abnormality. Musculoskeletal: The osseous structures are age-appropriate. No acute bone  abnormality identified. IMPRESSION: No acute intrathoracic pathology identified. No definite radiographic explanation for the patient's reported symptoms. 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 Hypertension 08/24/2020  . Headache 08/24/2020  . Viral URI with cough 08/24/2020  . Epigastric pain 08/26/2019  . Screening mammogram, encounter for 10/22/2018  . PCOS (polycystic ovarian syndrome) 09/26/2017  . Status post total abdominal hysterectomy 12/19/2016  . Insomnia 07/22/2016  . Auditory processing disorder 04/04/2016  . Encounter for routine gynecological examination 11/27/2014  . Morbid obesity ([place]) 12/13/2012  . Prediabetes 12/13/2012  . Routine general medical examination at a health care facility 12/04/2011  . HYPERTRIGLYCERIDEMIA 04/07/2008  . PROBLEMS WITH HEARING 04/07/2008  . Anxiety disorder 02/07/2008  . ALLERGIC RHINITIS 02/07/2008  . HYPOGLYCEMIA, REACTIVE 11/15/2006   Past Medical History:  Diagnosis Date  . Anemia   . Anxiety   . Depression    . Diabetes mellitus without complication ([place])    type 2  . Dyspnea   . Hearing loss    wears hearing aids bilateral  . History of allergic rhinitis   . History of anxiety   . History of blood transfusion    [facility] 09/2016 2 units transfused  . History of depression   . History of hyperlipidemia   . History of syncope   . Seizures ([place])    hx seizures as a child,no seizures since age 2 yrs old  . Sleep apnea    uses mouth guard at night   Past Surgical History:  Procedure Laterality Date  . [place]   x 2  . CYSTOSCOPY N/A 12/19/2016   Procedure: CYSTOSCOPY;  Surgeon: Nunzio Cobbs, MD;  Location: [facility];  Service: Gynecology;  Laterality: N/A;  . TOTAL LAPAROSCOPIC HYSTERECTOMY WITH SALPINGECTOMY Bilateral 12/19/2016   Procedure: HYSTERECTOMY TOTAL LAPAROSCOPIC WITH SALPINGECTOMY;  Surgeon: Nunzio Cobbs, MD;  Location: [facility];  Service: Gynecology;  Laterality: Bilateral;  . [place] EXTRACTION     Social History  Tobacco Use  . Smoking status: Never Smoker  . Smokeless tobacco: Never Used  Vaping Use  . Vaping Use: Never used  Substance Use Topics  . Alcohol use: Yes    Alcohol/week: 0.0 standard drinks    Comment: occasional  winie  . Drug use: No   Family History  Problem Relation Age of Onset  . Diabetes Father   . Diabetes Paternal Grandfather   . Prostate cancer Paternal Grandfather   . Diabetes Paternal Grandmother    Allergies  Allergen Reactions  . Influenza Vaccine Recombinant     Vomiting   . Metformin And Related     Nausea vomiting  . Penicillins Hives    Has patient had a PCN reaction causing immediate rash, facial/tongue/throat swelling, SOB or lightheadedness with hypotension: Yes Has patient had a PCN reaction causing severe rash involving mucus membranes or skin necrosis: No Has patient had a PCN reaction that required hospitalization: No Has patient had a PCN reaction occurring within the last  10 years: No If all of the above answers are "NO", then may proceed with Cephalosporin use.    Current Outpatient Medications on File Prior to Visit  Medication Sig Dispense Refill  . atorvastatin (LIPITOR) 20 MG tablet Take 1 tablet (20 mg total) by mouth at bedtime. 90 tablet 3  . Choline Fenofibrate (FENOFIBRIC ACID) 135 MG CPDR Take 135 mg by mouth at bedtime. 90 capsule 3  . doxycycline (VIBRAMYCIN) 100 MG capsule Take 1 capsule (100 mg total) by mouth 2 (two) times daily for 7 days. 14 capsule 0  . escitalopram (LEXAPRO) 10 MG tablet Take 1 tablet (10 mg total) by mouth daily. 90 tablet 3  . hydrochlorothiazide (HYDRODIURIL) 25 MG tablet Take 1 tablet (25 mg total) by mouth daily. 20 tablet 0  . ibuprofen (ADVIL) 600 MG tablet Take 1 tablet (600 mg total) by mouth every 6 (six) hours as needed. 30 tablet 0  . ONETOUCH VERIO test strip USE TO CHECK BLOOD SUGAR ONCE DAILY AS NEEDED 50 strip 0  . pantoprazole (PROTONIX) 40 MG tablet Take 1 tablet by mouth daily.     No current facility-administered medications on file prior to visit.    Review of Systems  Constitutional: Positive for fatigue. Negative for activity change, appetite change, fever and unexpected weight change.  HENT: Negative for congestion, ear pain, rhinorrhea, sinus pressure and sore throat.   Eyes: Negative for pain, redness and visual disturbance.  Respiratory: Negative for cough, shortness of breath and wheezing.   Cardiovascular: Negative for chest pain and palpitations.  Gastrointestinal: Negative for abdominal pain, blood in stool, constipation and diarrhea.  Endocrine: Negative for polydipsia and polyuria.  Genitourinary: Negative for dysuria, frequency and urgency.  Musculoskeletal: Negative for arthralgias, back pain and myalgias.  Skin: Negative for pallor and rash.  Allergic/Immunologic: Negative for environmental allergies.  Neurological: Positive for headaches. Negative for dizziness and syncope.   Hematological: Negative for adenopathy. Does not bruise/bleed easily.  Psychiatric/Behavioral: Negative for decreased concentration and dysphoric mood. The patient is not nervous/anxious.        Objective:   Physical Exam Constitutional:      General: She is not in acute distress.    Appearance: Normal appearance. She is well-developed. She is obese. She is not ill-appearing or diaphoretic.  HENT:     Head: Normocephalic and atraumatic.     Comments: No facial tenderness     Right Ear: Tympanic membrane, ear canal and external ear normal.  Left Ear: Tympanic membrane, ear canal and external ear normal.     Nose: Nose normal.     Mouth/Throat:     Pharynx: No oropharyngeal exudate.  Eyes:     General: No scleral icterus.       Right eye: No discharge.        Left eye: No discharge.     Conjunctiva/sclera: Conjunctivae normal.     Pupils: Pupils are equal, round, and reactive to light.     Comments: No nystagmus  Neck:     Thyroid: No thyromegaly.     Vascular: No carotid bruit or JVD.     Trachea: No tracheal deviation.  Cardiovascular:     Rate and Rhythm: Normal rate and regular rhythm.     Heart sounds: Normal heart sounds. No murmur heard.   Pulmonary:     Effort: Pulmonary effort is normal. No respiratory distress.     Breath sounds: Normal breath sounds. No wheezing or rales.  Abdominal:     General: Bowel sounds are normal. There is no distension.     Palpations: Abdomen is soft. There is no mass.     Tenderness: There is no abdominal tenderness.  Musculoskeletal:        General: No tenderness.     Cervical back: Full passive range of motion without pain, normal range of motion and neck supple. No tenderness.     Right lower leg: No edema.     Left lower leg: No edema.  Lymphadenopathy:     Cervical: No cervical adenopathy.  Skin:    General: Skin is warm and dry.     Coloration: Skin is not pale.     Findings: No erythema or rash.  Neurological:      Mental Status: She is alert and oriented to person, place, and time.     Cranial Nerves: No cranial nerve deficit, dysarthria or facial asymmetry.     Sensory: Sensation is intact. No sensory deficit.     Motor: No weakness, tremor, atrophy or abnormal muscle tone.     Coordination: Coordination normal.     Gait: Gait is intact. Gait normal.     Deep Tendon Reflexes: Reflexes are normal and symmetric.     Comments: No focal cerebellar signs   Psychiatric:        Mood and Affect: Mood normal.        Behavior: Behavior normal.        Thought Content: Thought content normal.           Assessment & Plan:   Problem List Items Addressed This Visit      Cardiovascular and Mediastinum   Hypertension - Primary    Now on hctz bp is improved  BP Readings from Last 1 Encounters:  08/24/20 134/84   No changes needed Most recent labs reviewed  Disc lifstyle change with low sodium diet and exercise  Unsure if elevated bp is causing headache or vice versa  Recent ER visit- Reviewed hospital records, lab results and studies in detail           Respiratory   Viral URI with cough    Improving with residual headache Recent ER visit-reassuring w/u Reviewed hospital records, lab results and studies in detail            Other   Morbid obesity ([place])    Enc DASH eating plan for this and blood pressure       Headache  This may be responsive to elevated bp or vice versa  Some features of migraine Recent trip to ER with reassuring w/u  Reviewed hospital records, lab results and studies in detail   CT is normal  HA is improved today  Disc med to break cycle-prednisone taper px  Enc water  Dec daily caffeine but can use for HA Clean diet/good self care  F/u in a month or earlier if not imp  ER parameters discussed

## 2020-08-24 NOTE — Assessment & Plan Note (Signed)
Enc DASH eating plan for this and blood pressure

## 2020-08-24 NOTE — Assessment & Plan Note (Addendum)
Now on hctz bp is improved  BP Readings from Last 1 Encounters:  08/24/20 134/84   No changes needed Will check bmet early next week  Most recent labs reviewed  Disc lifstyle change with low sodium diet and exercise  Unsure if elevated bp is causing headache or vice versa  Recent ER visit- Reviewed hospital records, lab results and studies in detail

## 2020-08-24 NOTE — Patient Instructions (Signed)
Schedule labs early next week for chemistry level   Take the prednisone as directed for headache and cough  Continue meds you are taking that help (as needed)  Continue the hctz  Finish the doxycycline   Update if not starting to improve in a week or if worsening    Follow up in about a month

## 2020-08-24 NOTE — Assessment & Plan Note (Signed)
This may be responsive to elevated bp or vice versa  Some features of migraine Recent trip to ER with reassuring w/u  Reviewed hospital records, lab results and studies in detail   CT is normal  HA is improved today  Disc med to break cycle-prednisone taper px  Enc water  Dec daily caffeine but can use for HA Clean diet/good self care  F/u in a month or earlier if not imp  ER parameters discussed

## 2020-08-30 ENCOUNTER — Telehealth: Payer: Self-pay | Admitting: Family Medicine

## 2020-08-31 ENCOUNTER — Other Ambulatory Visit: Payer: Self-pay

## 2020-08-31 ENCOUNTER — Encounter: Payer: Self-pay | Admitting: Family Medicine

## 2020-08-31 ENCOUNTER — Ambulatory Visit: Payer: BC Managed Care – PPO | Admitting: Family Medicine

## 2020-08-31 VITALS — BP 140/88 | HR 112 | Temp 97.1°F | Ht 64.0 in | Wt 255.0 lb

## 2020-08-31 DIAGNOSIS — R519 Headache, unspecified: Secondary | ICD-10-CM | POA: Diagnosis not present

## 2020-08-31 DIAGNOSIS — F411 Generalized anxiety disorder: Secondary | ICD-10-CM

## 2020-08-31 DIAGNOSIS — I10 Essential (primary) hypertension: Secondary | ICD-10-CM

## 2020-08-31 DIAGNOSIS — J069 Acute upper respiratory infection, unspecified: Secondary | ICD-10-CM | POA: Diagnosis not present

## 2020-08-31 DIAGNOSIS — R7303 Prediabetes: Secondary | ICD-10-CM

## 2020-08-31 MED ORDER — METOPROLOL SUCCINATE ER 50 MG PO TB24: 50.0000 mg | ORAL_TABLET | Freq: Every day | ORAL | 3 refills | Status: DC

## 2020-08-31 MED ORDER — GABAPENTIN 100 MG PO CAPS: 100.0000 mg | ORAL_CAPSULE | Freq: Two times a day (BID) | ORAL | 2 refills | Status: DC

## 2020-08-31 NOTE — Progress Notes (Signed)
Subjective:    Patient ID: Gail [name], female    DOB: June 07, 1970, 50 y.o.   MRN: [id number]  This visit occurred during the SARS-CoV-2 public health emergency.  Safety protocols were in place, including screening questions prior to the visit, additional usage of staff PPE, and extensive cleaning of exam room while observing appropriate contact time as indicated for disinfecting solutions.   HPI Pt presents for f/u of elevated bp and HA  Wt Readings from Last 3 Encounters:  08/31/20 255 lb (115.7 kg)  08/24/20 260 lb 1 oz (118 kg)  08/21/20 250 lb (113.4 kg)   43.77 kg/m  HTN BP Readings from Last 3 Encounters:  08/31/20 (!) 144/100  08/24/20 134/84  08/22/20 (!) 145/80   Pulse Readings from Last 3 Encounters:  08/31/20 (!) 112  08/24/20 95  08/22/20 86   Taking hctz 25 mg daily   Bp at home  No hypotension   Highest 170s/100s  150s/80s average     Lab Results  Component Value Date   CREATININE 0.75 08/22/2020   BUN 17 08/22/2020   NA 140 08/22/2020   K 3.5 08/22/2020   CL 103 08/22/2020   CO2 26 08/22/2020     Headache  Post viral and with HTN at last visit  Rev nl CT head (also CT chest) on 6/5 in ER Px prednisone taper to break the cycle last time  Most of headache is at night  Still frontal (occ in back) Throbbing  Worse with exertion   Occ the headache gets severe (at night )  Uses cold rags  Turns very red   Blood sugar got high 355 (now down to 200s) taking glimiperide  One more day of prednisone    Very tired / even if she sleeps ok   Still coughs but not as often   Patient Active Problem List   Diagnosis Date Noted   Hypertension 08/24/2020   Headache 08/24/2020   Viral URI with cough 08/24/2020   Epigastric pain 08/26/2019   Screening mammogram, encounter for 10/22/2018   PCOS (polycystic ovarian syndrome) 09/26/2017   Status post total abdominal hysterectomy 12/19/2016   Insomnia 07/22/2016   Auditory processing disorder  04/04/2016   Encounter for routine gynecological examination 11/27/2014   Morbid obesity ([place]) 12/13/2012   Prediabetes 12/13/2012   Routine general medical examination at a health care facility 12/04/2011   HYPERTRIGLYCERIDEMIA 04/07/2008   PROBLEMS WITH HEARING 04/07/2008   Anxiety disorder 02/07/2008   ALLERGIC RHINITIS 02/07/2008   HYPOGLYCEMIA, REACTIVE 11/15/2006   Past Medical History:  Diagnosis Date   Anemia    Anxiety    Depression    Diabetes mellitus without complication ([place])    type 2   Dyspnea    Hearing loss    wears hearing aids bilateral   History of allergic rhinitis    History of anxiety    History of blood transfusion    [facility] 09/2016 2 units transfused   History of depression    History of hyperlipidemia    History of syncope    Seizures (HCC)    hx seizures as a child,no seizures since age 68 yrs old   Sleep apnea    uses mouth guard at night   Past Surgical History:  Procedure Laterality Date   [place], 1995   x 2   CYSTOSCOPY N/A 12/19/2016   Procedure: CYSTOSCOPY;  Surgeon: Nunzio Cobbs, MD;  Location: [facility];  Service: Gynecology;  Laterality: N/A;   TOTAL LAPAROSCOPIC HYSTERECTOMY WITH SALPINGECTOMY Bilateral 12/19/2016   Procedure: HYSTERECTOMY TOTAL LAPAROSCOPIC WITH SALPINGECTOMY;  Surgeon: Nunzio Cobbs, MD;  Location: [facility];  Service: Gynecology;  Laterality: Bilateral;   WISDOM TOOTH EXTRACTION     Social History   Tobacco Use   Smoking status: Never   Smokeless tobacco: Never  Vaping Use   Vaping Use: Never used  Substance Use Topics   Alcohol use: Yes    Alcohol/week: 0.0 standard drinks    Comment: occasional  winie   Drug use: No   Family History  Problem Relation Age of Onset   Diabetes Father    Diabetes Paternal Grandfather    Prostate cancer Paternal Grandfather    Diabetes Paternal Grandmother    Allergies  Allergen Reactions   Influenza Vaccine Recombinant     Vomiting     Metformin And Related     Nausea vomiting   Penicillins Hives    Has patient had a PCN reaction causing immediate rash, facial/tongue/throat swelling, SOB or lightheadedness with hypotension: Yes Has patient had a PCN reaction causing severe rash involving mucus membranes or skin necrosis: No Has patient had a PCN reaction that required hospitalization: No Has patient had a PCN reaction occurring within the last 10 years: No If all of the above answers are "NO", then may proceed with Cephalosporin use.    Current Outpatient Medications on File Prior to Visit  Medication Sig Dispense Refill   atorvastatin (LIPITOR) 20 MG tablet Take 1 tablet (20 mg total) by mouth at bedtime. 90 tablet 3   Choline Fenofibrate (FENOFIBRIC ACID) 135 MG CPDR Take 135 mg by mouth at bedtime. 90 capsule 3   escitalopram (LEXAPRO) 10 MG tablet Take 1 tablet (10 mg total) by mouth daily. 90 tablet 3   glimepiride (AMARYL) 2 MG tablet Take 2 mg by mouth every morning.     hydrochlorothiazide (HYDRODIURIL) 25 MG tablet Take 1 tablet (25 mg total) by mouth daily. 20 tablet 0   ibuprofen (ADVIL) 600 MG tablet Take 1 tablet (600 mg total) by mouth every 6 (six) hours as needed. 30 tablet 0   ONETOUCH VERIO test strip USE TO CHECK BLOOD SUGAR ONCE DAILY AS NEEDED 50 strip 0   pantoprazole (PROTONIX) 40 MG tablet Take 1 tablet by mouth daily.     predniSONE (DELTASONE) 10 MG tablet Take 3 pills once daily by mouth for 3 days, then 2 pills once daily for 3 days, then 1 pill once daily for 3 days and then stop 18 tablet 0   No current facility-administered medications on file prior to visit.     Review of Systems  Constitutional:  Positive for fatigue. Negative for activity change, appetite change, fever and unexpected weight change.  HENT:  Negative for congestion, ear pain, rhinorrhea, sinus pressure and sore throat.   Eyes:  Negative for pain, redness and visual disturbance.  Respiratory:  Negative for cough,  shortness of breath and wheezing.   Cardiovascular:  Negative for chest pain and palpitations.  Gastrointestinal:  Negative for abdominal pain, blood in stool, constipation and diarrhea.  Endocrine: Negative for polydipsia and polyuria.       Flushing  Genitourinary:  Negative for dysuria, frequency and urgency.  Musculoskeletal:  Negative for arthralgias, back pain and myalgias.  Skin:  Negative for pallor and rash.  Allergic/Immunologic: Negative for environmental allergies.  Neurological:  Positive for headaches. Negative for dizziness, tremors, seizures,  syncope, facial asymmetry, speech difficulty, weakness, light-headedness and numbness.  Hematological:  Negative for adenopathy. Does not bruise/bleed easily.  Psychiatric/Behavioral:  Positive for decreased concentration. Negative for dysphoric mood. The patient is not nervous/anxious.       Objective:   Physical Exam Constitutional:      General: She is not in acute distress.    Appearance: She is well-developed. She is obese. She is not ill-appearing or diaphoretic.  HENT:     Head: Normocephalic and atraumatic.     Comments: No sinus or temporal tenderness    Right Ear: External ear normal.     Left Ear: External ear normal.     Nose: Nose normal.     Mouth/Throat:     Pharynx: No oropharyngeal exudate.  Eyes:     General: No scleral icterus.       Right eye: No discharge.        Left eye: No discharge.     Conjunctiva/sclera: Conjunctivae normal.     Pupils: Pupils are equal, round, and reactive to light.     Comments: No nystagmus  Neck:     Thyroid: No thyromegaly.     Vascular: No carotid bruit or JVD.     Trachea: No tracheal deviation.  Cardiovascular:     Rate and Rhythm: Regular rhythm. Tachycardia present.     Heart sounds: Normal heart sounds. No murmur heard. Pulmonary:     Effort: Pulmonary effort is normal. No respiratory distress.     Breath sounds: Normal breath sounds. No wheezing or rales.   Abdominal:     General: Bowel sounds are normal. There is no distension.     Palpations: Abdomen is soft. There is no mass.     Tenderness: There is no abdominal tenderness.  Musculoskeletal:        General: No tenderness.     Cervical back: Full passive range of motion without pain, normal range of motion and neck supple.     Right lower leg: No edema.     Left lower leg: No edema.  Lymphadenopathy:     Cervical: No cervical adenopathy.  Skin:    General: Skin is warm and dry.     Coloration: Skin is not pale.     Findings: No erythema or rash.  Neurological:     General: No focal deficit present.     Mental Status: She is alert and oriented to person, place, and time.     Cranial Nerves: No cranial nerve deficit, dysarthria or facial asymmetry.     Sensory: Sensation is intact. No sensory deficit.     Motor: No weakness, tremor, atrophy or abnormal muscle tone.     Coordination: Coordination is intact. Coordination normal.     Gait: Gait normal.     Deep Tendon Reflexes: Reflexes are normal and symmetric. Reflexes normal.     Comments:  No focal cerebellar signs  No nystagmus  No facial droop  Psychiatric:        Attention and Perception: Attention normal.        Behavior: Behavior normal.        Thought Content: Thought content normal.        Cognition and Memory: Cognition and memory normal.     Comments: Seems fatigued           Assessment & Plan:   Problem List Items Addressed This Visit       Cardiovascular and Mediastinum   Hypertension  bp remains elevated despite hctz  Lab for bmet today  Will add metoprolol xl 50 mg daily-disc poss side eff Will monitor bp /pulse and parameters discussed Hope this will also help daily headache  Will update re: symptoms and bp in a week        Relevant Medications   metoprolol succinate (TOPROL-XL) 50 MG 24 hr tablet   Other Relevant Orders   Basic metabolic panel     Respiratory   Viral URI with cough     Continues to improve  Some residual cough but less often          Other   Anxiety disorder    Recent elevated bp and headache are making this worse  Will try to relieve these  Beta blocker and gabapentin may help        Prediabetes    Glucose went up significantly with prednisone (now going back down)  Taking glimiperide from Dr Forde Dandy and this may be short term  Will continue to monitor       Headache - Primary    Persistent since uri and bp elevation and much worse at night  Rev labs and CT head from ER-reassuring  Suspect there are multiple triggers (post viral and HTN and anxiety)  Also high risk for sleep apnea Disc imp of good hydration  Trial of metoprolol xl for bp and headache Also gabapentin 100 mg bid (with option to titrate after prelim trial)  Asked pt to update re: bp and headache in a week  Neurology ref also done        Relevant Medications   metoprolol succinate (TOPROL-XL) 50 MG 24 hr tablet   gabapentin (NEURONTIN) 100 MG capsule   Other Relevant Orders   Ambulatory referral to Neurology

## 2020-08-31 NOTE — Assessment & Plan Note (Signed)
Glucose went up significantly with prednisone (now going back down)  Taking glimiperide from Dr Forde Dandy and this may be short term  Will continue to monitor

## 2020-08-31 NOTE — Assessment & Plan Note (Signed)
bp remains elevated despite hctz  Lab for bmet today  Will add metoprolol xl 50 mg daily-disc poss side eff Will monitor bp /pulse and parameters discussed Hope this will also help daily headache  Will update re: symptoms and bp in a week

## 2020-08-31 NOTE — Assessment & Plan Note (Addendum)
Persistent since uri and bp elevation and much worse at night  Rev labs and CT head from ER-reassuring  Suspect there are multiple triggers (post viral and HTN and anxiety)  Also high risk for sleep apnea Disc imp of good hydration  Trial of metoprolol xl for bp and headache Also gabapentin 100 mg bid (with option to titrate after prelim trial)  Asked pt to update re: bp and headache in a week  Neurology ref also done

## 2020-08-31 NOTE — Assessment & Plan Note (Signed)
Continues to improve  Some residual cough but less often

## 2020-08-31 NOTE — Patient Instructions (Signed)
Start the metoprolol xl 50 mg for blood pressure and headaches   After 2-3 days if no side effects- keep track of BP and pulse  If 90/50 bp or lower or pulse below 50- hold the medicine and let us know  Continue the hctz   Then start gabapentin 100 mg twice daily (am and pm)  If overly sedated or dizzy let me know   We will refer you to neurology  You will get a call   Call if you have any new symptoms like fever or neck stiffness

## 2020-08-31 NOTE — Assessment & Plan Note (Signed)
Recent elevated bp and headache are making this worse  Will try to relieve these  Beta blocker and gabapentin may help

## 2020-09-01 ENCOUNTER — Encounter: Payer: Self-pay | Admitting: Neurology

## 2020-09-01 LAB — BASIC METABOLIC PANEL
BUN: 31 mg/dL — ABNORMAL HIGH (ref 6–23)
CO2: 31 mEq/L (ref 19–32)
Calcium: 9.7 mg/dL (ref 8.4–10.5)
Chloride: 96 mEq/L (ref 96–112)
Creatinine, Ser: 0.97 mg/dL (ref 0.40–1.20)
GFR: 68.96 mL/min (ref >60.00)
Glucose, Bld: 177 mg/dL — ABNORMAL HIGH (ref 70–99)
Potassium: 4.2 mEq/L (ref 3.5–5.1)
Sodium: 139 mEq/L (ref 135–145)

## 2020-09-02 ENCOUNTER — Other Ambulatory Visit: Payer: BC Managed Care – PPO

## 2020-09-09 ENCOUNTER — Telehealth: Payer: Self-pay

## 2020-09-09 MED ORDER — HYDROCHLOROTHIAZIDE 25 MG PO TABS: 25.0000 mg | ORAL_TABLET | Freq: Every day | ORAL | 3 refills | Status: DC

## 2020-09-09 NOTE — Telephone Encounter (Signed)
I sent it in  How is she feeling and how is her BP?

## 2020-09-09 NOTE — Telephone Encounter (Signed)
Pt notified Rx sent into to pharmacy. Pt said she is feeling better and no longer has HAs. Her BP is better but still a little hight. This morning BP was 148/87. Pt wanted to know if she needs to see cardiology or if there is anything else PCP recommends. BP f/u is scheduled with PCP but it's not until 09/23/20, please advise

## 2020-09-09 NOTE — Telephone Encounter (Signed)
Pt saw Dr Glori Bickers on 08-31-20 for ER F/U headache with elevated BP. If she is to continue the HCTZ, she will need a rx sent to [facility]. Please advise pt what to do at [phone].

## 2020-09-09 NOTE — Telephone Encounter (Signed)
That is re assuring in terms of blood pressure - especially the diastolic. Glad the headache is better.  If her pulse is over 70 we could potentially go up on the metoprolol a bit more (or discuss it when she sees me)

## 2020-09-09 NOTE — Telephone Encounter (Signed)
error 

## 2020-09-10 DIAGNOSIS — G4733 Obstructive sleep apnea (adult) (pediatric): Secondary | ICD-10-CM | POA: Diagnosis not present

## 2020-09-10 NOTE — Telephone Encounter (Signed)
Pt notified of Dr. Marliss Coots comments and verbalized understanding. Pt is okay waiting to discuss options with PCP regarding increasing meds and referrals at her OV on 09/23/20. Pt advise if BP does increase or her HA returns in the mean time to let us know asap. Pt verbalized understanding and will discuss everything with Dr. Glori Bickers at appt.  FYI to PCP

## 2020-09-23 ENCOUNTER — Other Ambulatory Visit: Payer: Self-pay

## 2020-09-23 ENCOUNTER — Ambulatory Visit: Payer: BC Managed Care – PPO | Admitting: Family Medicine

## 2020-09-23 ENCOUNTER — Encounter: Payer: Self-pay | Admitting: Family Medicine

## 2020-09-23 VITALS — BP 122/80 | HR 71 | Temp 97.0°F | Ht 64.0 in | Wt 261.1 lb

## 2020-09-23 DIAGNOSIS — I10 Essential (primary) hypertension: Secondary | ICD-10-CM

## 2020-09-23 DIAGNOSIS — R519 Headache, unspecified: Secondary | ICD-10-CM

## 2020-09-23 NOTE — Progress Notes (Signed)
Subjective:    Patient ID: Gail [name], female    DOB: [birth year], 50 y.o.   MRN: [id number]  This visit occurred during the SARS-CoV-2 public health emergency.  Safety protocols were in place, including screening questions prior to the visit, additional usage of staff PPE, and extensive cleaning of exam room while observing appropriate contact time as indicated for disinfecting solutions.   HPI Pt presents for HTN and headache   Wt Readings from Last 3 Encounters:  09/23/20 261 lb 2 oz (118.4 kg)  08/31/20 255 lb (115.7 kg)  08/24/20 260 lb 1 oz (118 kg)   44.82 kg/m   HTN BP Readings from Last 3 Encounters:  09/23/20 122/80  08/31/20 140/88  08/24/20 134/84   Pulse Readings from Last 3 Encounters:  09/23/20 71  08/31/20 (!) 112  08/24/20 95   Improved Last visit added metoprolol xl 50 mg daily  Continues hctz 25 mg daily   Lab Results  Component Value Date   CREATININE 0.97 08/31/2020   BUN 31 (H) 08/31/2020   NA 139 08/31/2020   K 4.2 08/31/2020   CL 96 08/31/2020   CO2 31 08/31/2020    Headache-much improved Will still get one when she does a lot/exerts herself  Mostly on the L side  Advil helps   Gabapentin - did not start taking that  Also metoprolol   Anxiety - better  Some fatigue   Patient Active Problem List   Diagnosis Date Noted   Hypertension 08/24/2020   Headache 08/24/2020   Epigastric pain 08/26/2019   Screening mammogram, encounter for 10/22/2018   PCOS (polycystic ovarian syndrome) 09/26/2017   Status post total abdominal hysterectomy 12/19/2016   Insomnia 07/22/2016   Auditory processing disorder 04/04/2016   Encounter for routine gynecological examination 11/27/2014   Morbid obesity (Gail [name]) 12/13/2012   Prediabetes 12/13/2012   Routine general medical examination at a health care facility 12/04/2011   HYPERTRIGLYCERIDEMIA 04/07/2008   PROBLEMS WITH HEARING 04/07/2008   Anxiety disorder 02/07/2008   ALLERGIC RHINITIS  02/07/2008   HYPOGLYCEMIA, REACTIVE 11/15/2006   Past Medical History:  Diagnosis Date   Anemia    Anxiety    Depression    Diabetes mellitus without complication ([place])    type 2   Dyspnea    Hearing loss    wears hearing aids bilateral   History of allergic rhinitis    History of anxiety    History of blood transfusion    [facility] 09/2016 2 units transfused   History of depression    History of hyperlipidemia    History of syncope    Seizures ([place])    hx seizures as a child,no seizures since age 73 yrs old   Sleep apnea    uses mouth guard at night   Past Surgical History:  Procedure Laterality Date   [place]   x 2   CYSTOSCOPY N/A 12/19/2016   Procedure: CYSTOSCOPY;  Surgeon: Nunzio Cobbs, MD;  Location: [facility];  Service: Gynecology;  Laterality: N/A;   TOTAL LAPAROSCOPIC HYSTERECTOMY WITH SALPINGECTOMY Bilateral 12/19/2016   Procedure: HYSTERECTOMY TOTAL LAPAROSCOPIC WITH SALPINGECTOMY;  Surgeon: Nunzio Cobbs, MD;  Location: [facility];  Service: Gynecology;  Laterality: Bilateral;   WISDOM TOOTH EXTRACTION     Social History   Tobacco Use   Smoking status: Never   Smokeless tobacco: Never  Vaping Use   Vaping Use: Never used  Substance Use Topics  Alcohol use: Yes    Alcohol/week: 0.0 standard drinks    Comment: occasional  winie   Drug use: No   Family History  Problem Relation Age of Onset   Diabetes Father    Diabetes Paternal Grandfather    Prostate cancer Paternal Grandfather    Diabetes Paternal Grandmother    Allergies  Allergen Reactions   Influenza Vaccine Recombinant     Vomiting    Metformin And Related     Nausea vomiting   Penicillins Hives    Has patient had a PCN reaction causing immediate rash, facial/tongue/throat swelling, SOB or lightheadedness with hypotension: Yes Has patient had a PCN reaction causing severe rash involving mucus membranes or skin necrosis: No Has patient had a PCN reaction  that required hospitalization: No Has patient had a PCN reaction occurring within the last 10 years: No If all of the above answers are "NO", then may proceed with Cephalosporin use.    Current Outpatient Medications on File Prior to Visit  Medication Sig Dispense Refill   atorvastatin (LIPITOR) 20 MG tablet Take 1 tablet (20 mg total) by mouth at bedtime. 90 tablet 3   Choline Fenofibrate (FENOFIBRIC ACID) 135 MG CPDR Take 135 mg by mouth at bedtime. 90 capsule 3   escitalopram (LEXAPRO) 10 MG tablet Take 1 tablet (10 mg total) by mouth daily. 90 tablet 3   hydrochlorothiazide (HYDRODIURIL) 25 MG tablet Take 1 tablet (25 mg total) by mouth daily. 90 tablet 3   metoprolol succinate (TOPROL-XL) 50 MG 24 hr tablet Take 1 tablet (50 mg total) by mouth daily. Take with or immediately following a meal. 90 tablet 3   ONETOUCH VERIO test strip USE TO CHECK BLOOD SUGAR ONCE DAILY AS NEEDED 50 strip 0   pantoprazole (PROTONIX) 40 MG tablet Take 1 tablet by mouth daily.     No current facility-administered medications on file prior to visit.     Review of Systems  Constitutional:  Positive for fatigue. Negative for activity change, appetite change, fever and unexpected weight change.  HENT:  Negative for congestion, ear pain, rhinorrhea, sinus pressure and sore throat.   Eyes:  Negative for pain, redness and visual disturbance.  Respiratory:  Negative for cough, shortness of breath and wheezing.   Cardiovascular:  Negative for chest pain and palpitations.  Gastrointestinal:  Negative for abdominal pain, blood in stool, constipation and diarrhea.  Endocrine: Negative for polydipsia and polyuria.  Genitourinary:  Negative for dysuria, frequency and urgency.  Musculoskeletal:  Negative for arthralgias, back pain and myalgias.  Skin:  Negative for pallor and rash.  Allergic/Immunologic: Negative for environmental allergies.  Neurological:  Positive for headaches. Negative for dizziness, syncope,  weakness and light-headedness.  Hematological:  Negative for adenopathy. Does not bruise/bleed easily.  Psychiatric/Behavioral:  Negative for decreased concentration and dysphoric mood. The patient is not nervous/anxious.       Objective:   Physical Exam Constitutional:      General: She is not in acute distress.    Appearance: Normal appearance. She is well-developed. She is obese. She is not ill-appearing.  HENT:     Head: Normocephalic and atraumatic.  Eyes:     General: No scleral icterus.    Conjunctiva/sclera: Conjunctivae normal.     Pupils: Pupils are equal, round, and reactive to light.  Neck:     Thyroid: No thyromegaly.     Vascular: No carotid bruit or JVD.  Cardiovascular:     Rate and Rhythm: Normal rate and  regular rhythm.     Heart sounds: Normal heart sounds.    No gallop.  Pulmonary:     Effort: Pulmonary effort is normal. No respiratory distress.     Breath sounds: Normal breath sounds. No wheezing or rales.  Abdominal:     General: There is no abdominal bruit.     Palpations: There is no mass.  Musculoskeletal:     Cervical back: Normal range of motion and neck supple.     Right lower leg: No edema.     Left lower leg: No edema.  Lymphadenopathy:     Cervical: No cervical adenopathy.  Skin:    General: Skin is warm and dry.     Coloration: Skin is not pale.     Findings: No erythema or rash.  Neurological:     Mental Status: She is alert.     Cranial Nerves: No cranial nerve deficit.     Motor: No weakness.     Coordination: Coordination normal.     Deep Tendon Reflexes: Reflexes are normal and symmetric. Reflexes normal.     Comments: No tremor   Psychiatric:        Mood and Affect: Mood normal.     Comments: Pleasant  Not anxious          Assessment & Plan:   Problem List Items Addressed This Visit       Cardiovascular and Mediastinum   Hypertension - Primary    Much improved with addn of metoprolol  BP: 122/80  HA much  improved No edema  Tolerates well Will continue hctz 25 mg daily and metoprolol xl 50 mg daily  F/u in the fall  Rev last labs         Other   Headache    Much improved with addn of metoprolol xl 50 mg daily (for bp and ha both) Did not end up needing gabapentin  Will watch closely  Disc hydration and balanced diet  Continue to follow

## 2020-09-23 NOTE — Assessment & Plan Note (Signed)
Much improved with addn of metoprolol  BP: 122/80  HA much improved No edema  Tolerates well Will continue hctz 25 mg daily and metoprolol xl 50 mg daily  F/u in the fall  Rev last labs

## 2020-09-23 NOTE — Assessment & Plan Note (Signed)
Much improved with addn of metoprolol xl 50 mg daily (for bp and ha both) Did not end up needing gabapentin  Will watch closely  Disc hydration and balanced diet  Continue to follow

## 2020-09-23 NOTE — Patient Instructions (Addendum)
Watch signs of low bp like light headedness   90/50 or below-let us know  Stay very well hydrated  Balanced diet/exercise  Avoid excess sodium

## 2020-09-25 ENCOUNTER — Other Ambulatory Visit: Payer: Self-pay | Admitting: Family Medicine

## 2020-09-28 ENCOUNTER — Other Ambulatory Visit: Payer: Self-pay

## 2020-09-28 ENCOUNTER — Ambulatory Visit
Admission: RE | Admit: 2020-09-28 | Discharge: 2020-09-28 | Disposition: A | Discharge status: Home / Self Care | Payer: BC Managed Care – PPO | Source: Ambulatory Visit | Attending: Family Medicine | Admitting: Family Medicine

## 2020-09-28 DIAGNOSIS — Z1231 Encounter for screening mammogram for malignant neoplasm of breast: Secondary | ICD-10-CM

## 2020-09-28 IMAGING — MG MM DIGITAL SCREENING BILAT W/ TOMO AND CAD
6 of 10 series · 6 of 30 positions shown · non-contrast
Comparison: Previous exam(s).

CLINICAL DATA: Screening.

EXAM:
DIGITAL SCREENING BILATERAL MAMMOGRAM WITH TOMOSYNTHESIS AND CAD
TECHNIQUE: Bilateral screening digital craniocaudal and mediolateral oblique
mammograms were obtained. Bilateral screening digital breast
tomosynthesis was performed. The images were evaluated with
computer-aided detection.

[R CC synth-2D]
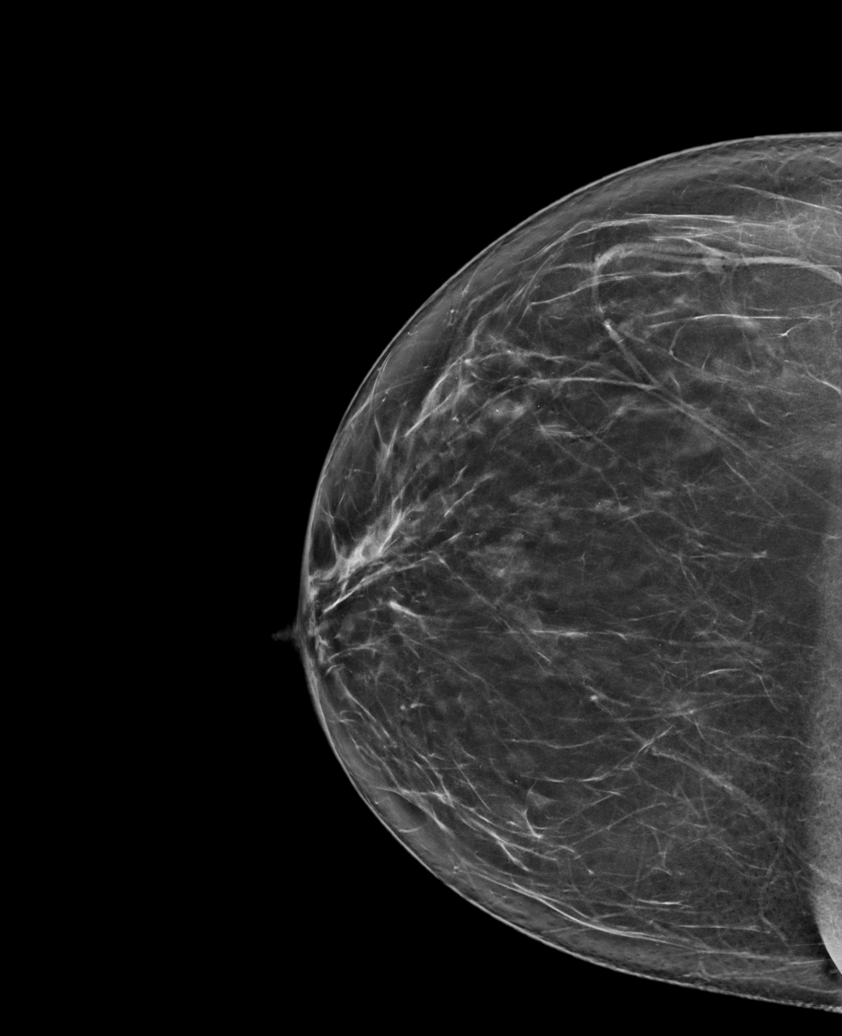

[R MLO synth-2D]
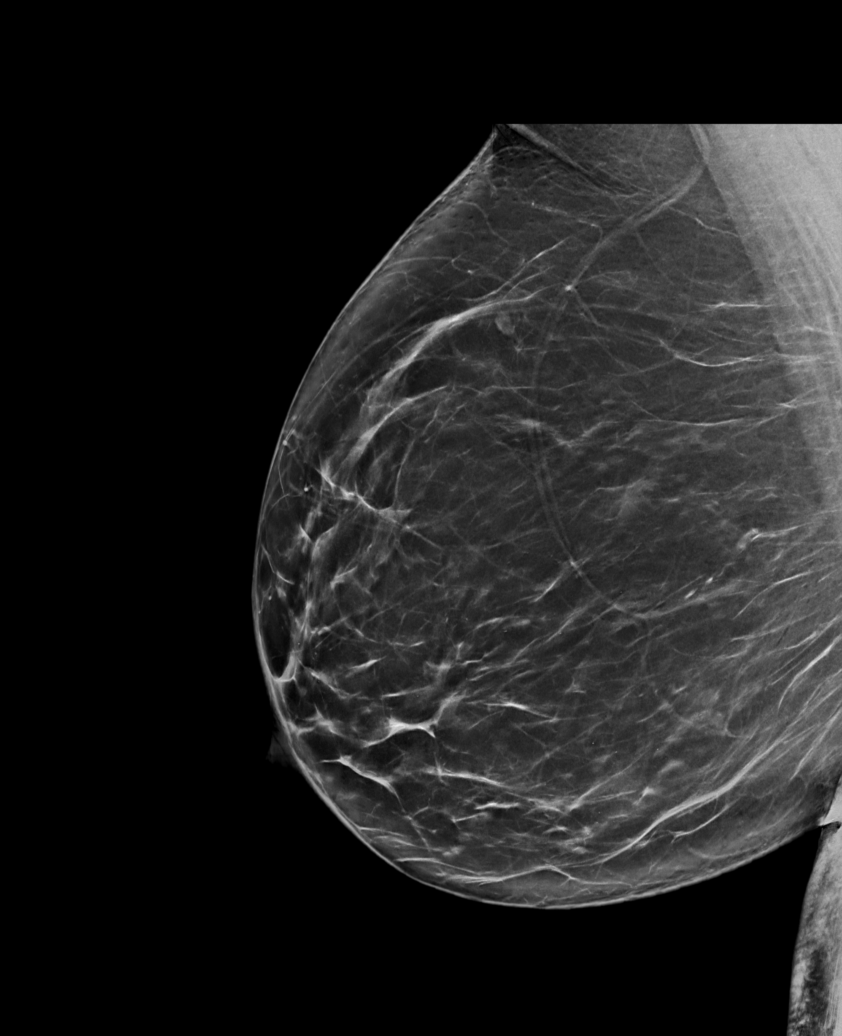

[L CC synth-2D (1 of 2)]
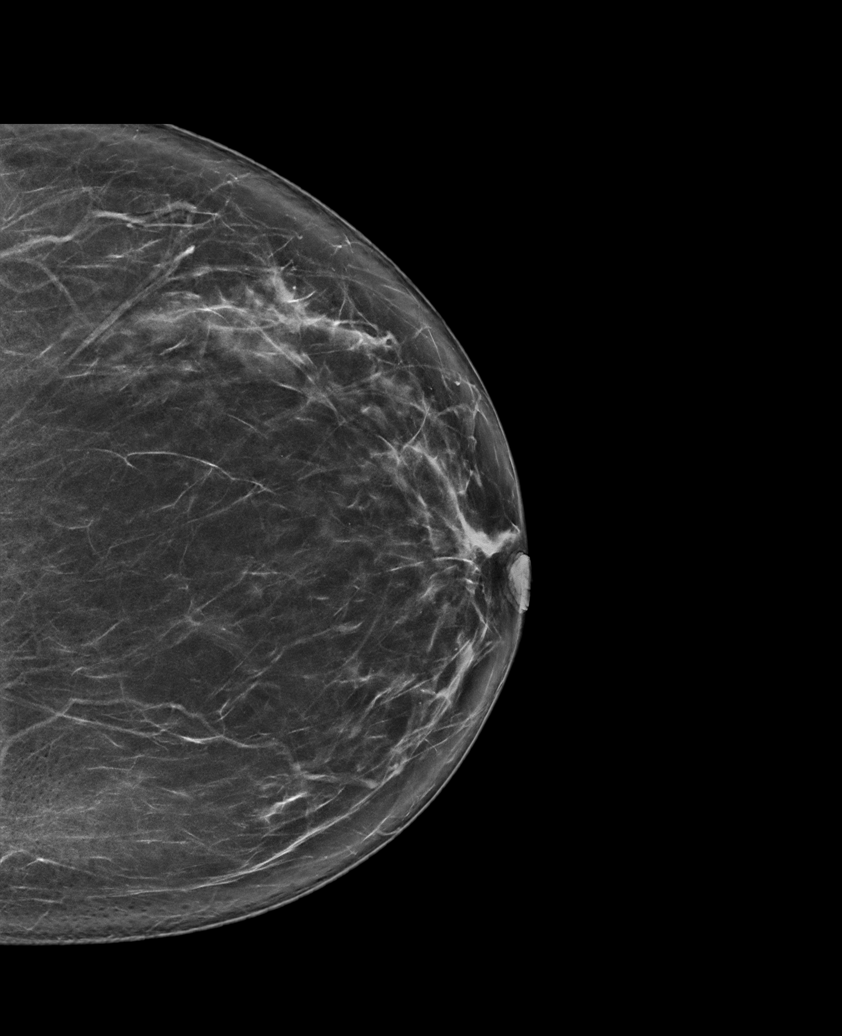

[L MLO synth-2D]
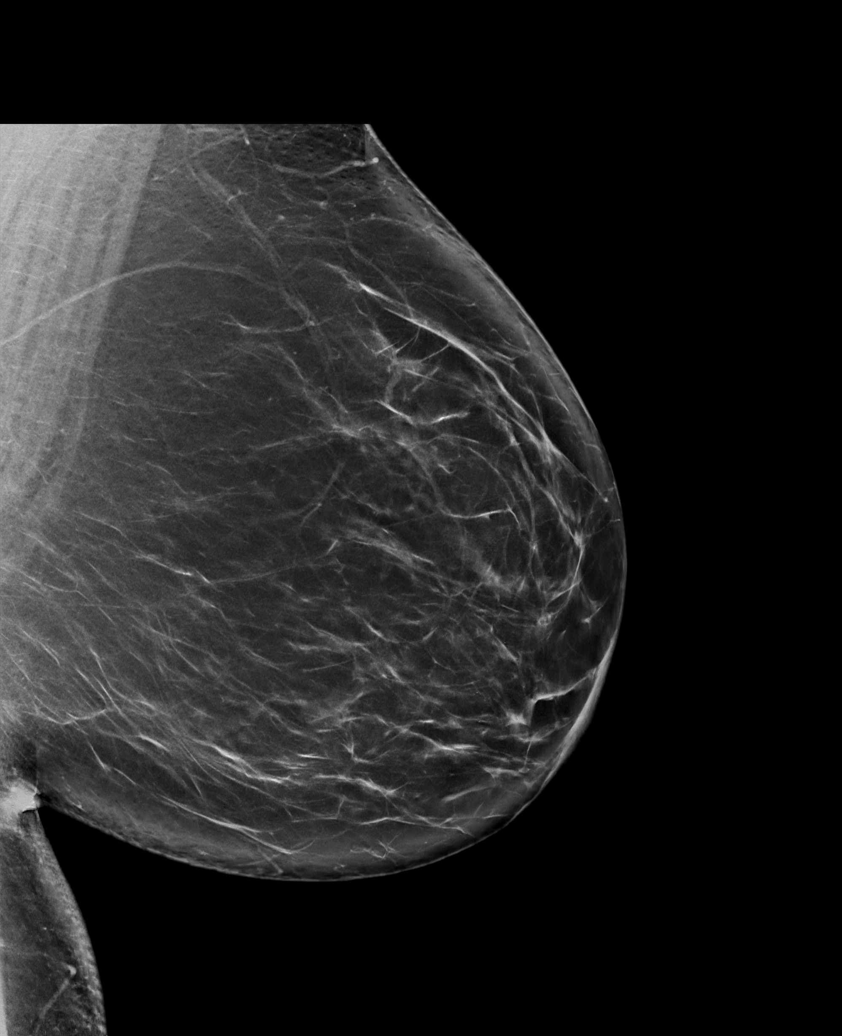

[L CC synth-2D (2 of 2)]
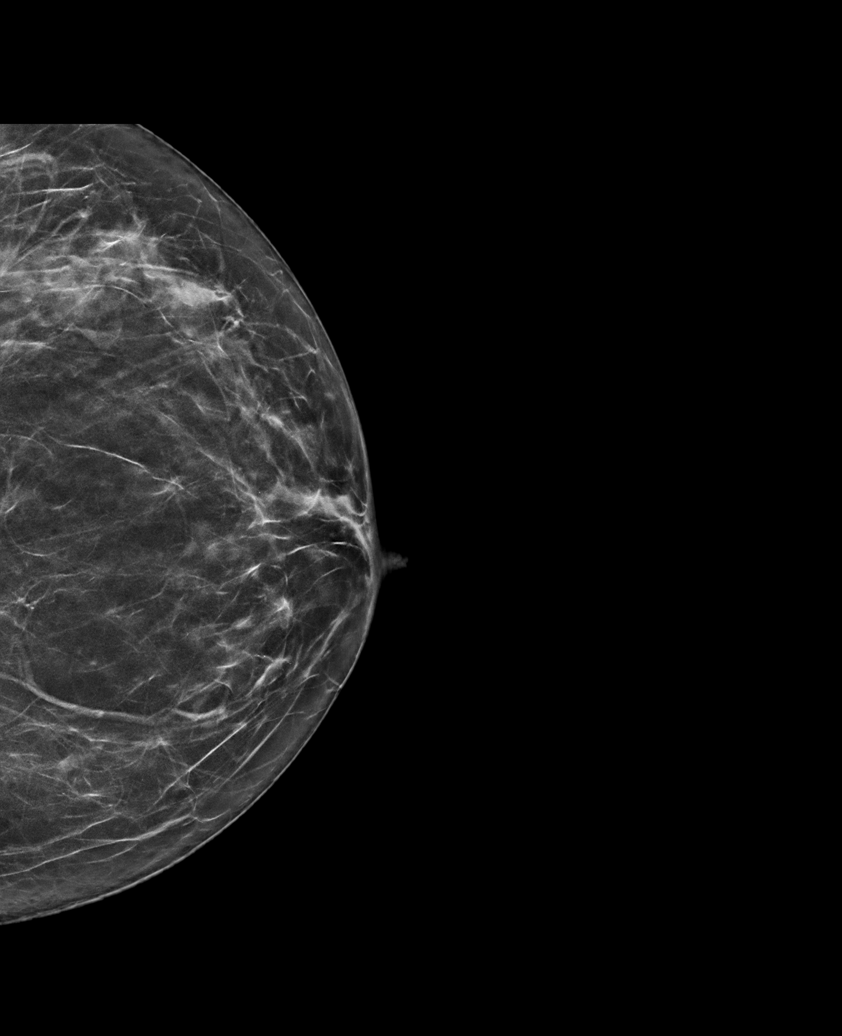

[R CC tomo · tomo slice 41/81.0]
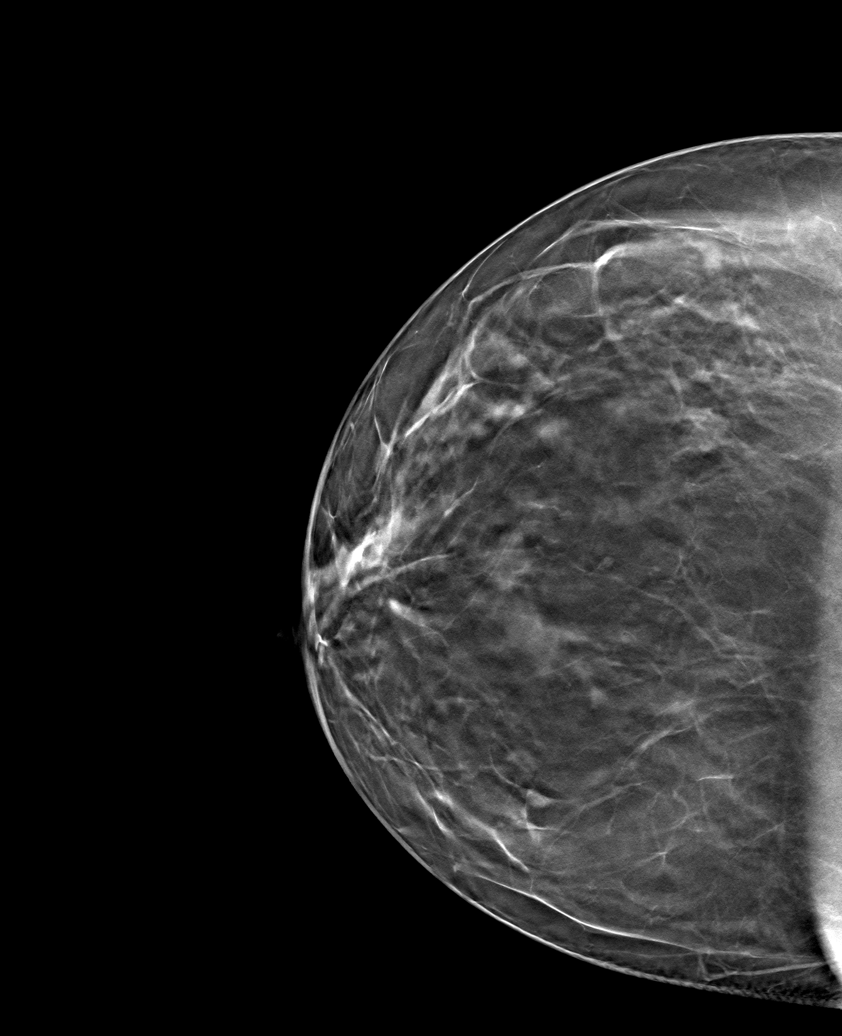

[6 of 30 positions shown; findings below may reference images not displayed]

ACR Breast Density Category b: There are scattered areas of
fibroglandular density.
FINDINGS: There are no findings suspicious for malignancy.
IMPRESSION: No mammographic evidence of malignancy. A result letter of this
screening mammogram will be mailed directly to the patient.

RECOMMENDATION:
Screening mammogram in one year. (Code:[BY])

BI-RADS CATEGORY  1: Negative.

## 2020-09-29 DIAGNOSIS — E119 Type 2 diabetes mellitus without complications: Secondary | ICD-10-CM | POA: Diagnosis not present

## 2020-10-10 DIAGNOSIS — G4733 Obstructive sleep apnea (adult) (pediatric): Secondary | ICD-10-CM | POA: Diagnosis not present

## 2020-10-12 ENCOUNTER — Telehealth: Payer: Self-pay

## 2020-10-12 NOTE — Telephone Encounter (Signed)
Gail [name] is a diuretic and will cause increased urination  If dysuria or bladder pain or other symptoms -please f/u

## 2020-10-12 NOTE — Telephone Encounter (Signed)
Pt called in and states that she is having increased urination x 1 week and wondered if it could be from the HCTZ. She states that she started the HCTZ while she was in the hospital 08/20/20. Pt does not have dysuria or any lower back pain at this time.

## 2020-10-13 NOTE — Telephone Encounter (Signed)
I will see her then  

## 2020-10-13 NOTE — Telephone Encounter (Signed)
Pt notified of Dr. Marliss Coots comments. Pt scheduled an appt with PCP on Monday to discuss her urinary issues, still get a UA since pt has been on med for a few months and the frequency just started last week. Also she wants to discuss coming off med if she doesn't have a UTI and the frequency is just due to HCTZ pt said she didn't want to take it anymore, but will discuss this with PCP at appt.   FYI to PCP

## 2020-10-18 ENCOUNTER — Encounter: Payer: Self-pay | Admitting: Family Medicine

## 2020-10-18 ENCOUNTER — Ambulatory Visit: Payer: BC Managed Care – PPO | Admitting: Family Medicine

## 2020-10-18 ENCOUNTER — Other Ambulatory Visit: Payer: Self-pay

## 2020-10-18 VITALS — BP 126/76 | HR 80 | Temp 97.7°F | Ht 64.0 in | Wt 260.1 lb

## 2020-10-18 DIAGNOSIS — I10 Essential (primary) hypertension: Secondary | ICD-10-CM | POA: Diagnosis not present

## 2020-10-18 DIAGNOSIS — R350 Frequency of micturition: Secondary | ICD-10-CM | POA: Diagnosis not present

## 2020-10-18 LAB — POC URINALSYSI DIPSTICK (AUTOMATED)
Bilirubin, UA: NEGATIVE
Blood, UA: NEGATIVE
Glucose, UA: NEGATIVE
Ketones, UA: NEGATIVE
Leukocytes, UA: NEGATIVE
Nitrite, UA: NEGATIVE
Protein, UA: NEGATIVE
Spec Grav, UA: >=1.03 — AB (ref 1.010–1.025)
Urobilinogen, UA: 0.2 E.U./dL
pH, UA: 6 (ref 5.0–8.0)

## 2020-10-18 NOTE — Assessment & Plan Note (Signed)
No dysuria or other symptoms  Urine is concentrated-otherwise nl ua  Suspect due to hctz  Enc good fluid intake  Update if not starting to improve in a week or if worsening

## 2020-10-18 NOTE — Patient Instructions (Signed)
Stop the hctz   Drink fluids Aim for 64 oz per day for bladder and kidney healthy  More water than anything else   You may be a little dehydrated right now   Watch blood pressure 2-3 times per week   If BP stays above 140 top or 90 bottom consistently let me know  Also if headache does not go away

## 2020-10-18 NOTE — Progress Notes (Signed)
Subjective:    Patient ID: Gail [name], female    DOB: [birth year], 50 y.o.   MRN: [id number]  This visit occurred during the SARS-CoV-2 public health emergency.  Safety protocols were in place, including screening questions prior to the visit, additional usage of staff PPE, and extensive cleaning of exam room while observing appropriate contact time as indicated for disinfecting solutions.   HPI Pt presents for urinary frequency and to discuss hctz  Wt Readings from Last 3 Encounters:  10/18/20 260 lb 1 oz (118 kg)  09/23/20 261 lb 2 oz (118.4 kg)  08/31/20 255 lb (115.7 kg)   44.64 kg/m  No burning  Just frequency and urgency  No incontinence  No blood in urine   No flank pain or nausea or fever   HTN bp is stable today  No cp or palpitations or headaches or edema  No side effects to medicines  BP Readings from Last 3 Encounters:  10/18/20 126/76  09/23/20 122/80  08/31/20 140/88    Hctz 25 mg daily and metoprolol xl 50 mg daily   Pulse Readings from Last 3 Encounters:  10/18/20 80  09/23/20 71  08/31/20 (!) 112   Feeling pretty good  Headache today- overdid it this weekend   Results for orders placed or performed in visit on 10/18/20  POCT Urinalysis Dipstick (Automated)  Result Value Ref Range   Color, UA Yellow    Clarity, UA Clear    Glucose, UA Negative Negative   Bilirubin, UA Negative    Ketones, UA Negative    Spec Grav, UA >=1.030 (A) 1.010 - 1.025   Blood, UA Negative    pH, UA 6.0 5.0 - 8.0   Protein, UA Negative Negative   Urobilinogen, UA 0.2 0.2 or 1.0 E.U./dL   Nitrite, UA Negative    Leukocytes, UA Negative Negative   Patient Active Problem List   Diagnosis Date Noted   Urinary frequency 10/18/2020   Hypertension 08/24/2020   Headache 08/24/2020   Epigastric pain 08/26/2019   Screening mammogram, encounter for 10/22/2018   PCOS (polycystic ovarian syndrome) 09/26/2017   Status post total abdominal hysterectomy 12/19/2016    Insomnia 07/22/2016   Auditory processing disorder 04/04/2016   Encounter for routine gynecological examination 11/27/2014   Morbid obesity ([place]) 12/13/2012   Prediabetes 12/13/2012   Routine general medical examination at a health care facility 12/04/2011   HYPERTRIGLYCERIDEMIA 04/07/2008   PROBLEMS WITH HEARING 04/07/2008   Anxiety disorder 02/07/2008   ALLERGIC RHINITIS 02/07/2008   HYPOGLYCEMIA, REACTIVE 11/15/2006   Past Medical History:  Diagnosis Date   Anemia    Anxiety    Depression    Diabetes mellitus without complication (HCC)    type 2   Dyspnea    Hearing loss    wears hearing aids bilateral   History of allergic rhinitis    History of anxiety    History of blood transfusion    [facility] 09/2016 2 units transfused   History of depression    History of hyperlipidemia    History of syncope    Seizures ([place])    hx seizures as a child,no seizures since age 89 yrs old   Sleep apnea    uses mouth guard at night   Past Surgical History:  Procedure Laterality Date   [place]   x 2   CYSTOSCOPY N/A 12/19/2016   Procedure: CYSTOSCOPY;  Surgeon: Nunzio Cobbs, MD;  Location: [facility];  Service: Gynecology;  Laterality: N/A;   TOTAL LAPAROSCOPIC HYSTERECTOMY WITH SALPINGECTOMY Bilateral 12/19/2016   Procedure: HYSTERECTOMY TOTAL LAPAROSCOPIC WITH SALPINGECTOMY;  Surgeon: Nunzio Cobbs, MD;  Location: [facility];  Service: Gynecology;  Laterality: Bilateral;   WISDOM TOOTH EXTRACTION     Social History   Tobacco Use   Smoking status: Never   Smokeless tobacco: Never  Vaping Use   Vaping Use: Never used  Substance Use Topics   Alcohol use: Yes    Alcohol/week: 0.0 standard drinks    Comment: occasional  winie   Drug use: No   Family History  Problem Relation Age of Onset   Diabetes Father    Diabetes Paternal Grandfather    Prostate cancer Paternal Grandfather    Diabetes Paternal Grandmother    Allergies  Allergen Reactions    Influenza Vaccine Recombinant     Vomiting    Metformin And Related     Nausea vomiting   Penicillins Hives    Has patient had a PCN reaction causing immediate rash, facial/tongue/throat swelling, SOB or lightheadedness with hypotension: Yes Has patient had a PCN reaction causing severe rash involving mucus membranes or skin necrosis: No Has patient had a PCN reaction that required hospitalization: No Has patient had a PCN reaction occurring within the last 10 years: No If all of the above answers are "NO", then may proceed with Cephalosporin use.    Current Outpatient Medications on File Prior to Visit  Medication Sig Dispense Refill   atorvastatin (LIPITOR) 20 MG tablet Take 1 tablet (20 mg total) by mouth at bedtime. 90 tablet 3   Choline Fenofibrate (FENOFIBRIC ACID) 135 MG CPDR Take 135 mg by mouth at bedtime. 90 capsule 3   escitalopram (LEXAPRO) 10 MG tablet Take 1 tablet (10 mg total) by mouth daily. 90 tablet 3   glucose blood (ONETOUCH VERIO) test strip USE TO CHECK BLOOD SUGAR ONCE DAILY AS NEEDED 50 strip 2   metoprolol succinate (TOPROL-XL) 50 MG 24 hr tablet Take 1 tablet (50 mg total) by mouth daily. Take with or immediately following a meal. 90 tablet 3   pantoprazole (PROTONIX) 40 MG tablet Take 1 tablet by mouth daily.     No current facility-administered medications on file prior to visit.     Review of Systems  Constitutional:  Negative for activity change, appetite change, fever and unexpected weight change.  HENT:  Negative for congestion, ear pain, rhinorrhea, sinus pressure and sore throat.   Eyes:  Negative for pain, redness and visual disturbance.  Respiratory:  Negative for cough, shortness of breath and wheezing.   Cardiovascular:  Negative for chest pain and palpitations.  Gastrointestinal:  Negative for abdominal pain, blood in stool, constipation and diarrhea.  Endocrine: Negative for polydipsia and polyuria.  Genitourinary:  Positive for frequency  and urgency. Negative for decreased urine volume, dysuria, flank pain and hematuria.  Musculoskeletal:  Negative for arthralgias, back pain and myalgias.  Skin:  Negative for pallor and rash.  Allergic/Immunologic: Negative for environmental allergies.  Neurological:  Negative for dizziness, syncope and headaches.  Hematological:  Negative for adenopathy. Does not bruise/bleed easily.  Psychiatric/Behavioral:  Negative for decreased concentration and dysphoric mood. The patient is not nervous/anxious.       Objective:   Physical Exam Constitutional:      General: She is not in acute distress.    Appearance: Normal appearance. She is well-developed. She is obese. She is not ill-appearing or diaphoretic.  HENT:  Head: Normocephalic and atraumatic.  Eyes:     Conjunctiva/sclera: Conjunctivae normal.     Pupils: Pupils are equal, round, and reactive to light.  Neck:     Thyroid: No thyromegaly.     Vascular: No carotid bruit or JVD.  Cardiovascular:     Rate and Rhythm: Normal rate and regular rhythm.     Heart sounds: Normal heart sounds.    No gallop.  Pulmonary:     Effort: Pulmonary effort is normal. No respiratory distress.     Breath sounds: Normal breath sounds. No wheezing or rales.  Abdominal:     General: Bowel sounds are normal. There is no distension or abdominal bruit.     Palpations: Abdomen is soft. There is no mass.     Tenderness: There is no abdominal tenderness. There is no right CVA tenderness or left CVA tenderness.     Comments: No suprapubic tenderness or fullness    Musculoskeletal:     Cervical back: Normal range of motion and neck supple.     Right lower leg: No edema.     Left lower leg: No edema.  Lymphadenopathy:     Cervical: No cervical adenopathy.  Skin:    General: Skin is warm and dry.     Coloration: Skin is not pale.     Findings: No rash.  Neurological:     Mental Status: She is alert.     Coordination: Coordination normal.     Deep  Tendon Reflexes: Reflexes are normal and symmetric. Reflexes normal.  Psychiatric:        Mood and Affect: Mood normal.          Assessment & Plan:   Problem List Items Addressed This Visit       Cardiovascular and Mediastinum   Hypertension    bp is in much better control  BP: 126/76   Pt would like to stop hctz due to urinary frequency  inst to stop this and monitor bp at home  Plan to continue metoprolol xl 50 mg daily  We can inc this dose if needed          Other   Urinary frequency - Primary    No dysuria or other symptoms  Urine is concentrated-otherwise nl ua  Suspect due to hctz  Enc good fluid intake  Update if not starting to improve in a week or if worsening          Relevant Orders   POCT Urinalysis Dipstick (Automated) (Completed)

## 2020-10-18 NOTE — Assessment & Plan Note (Signed)
bp is in much better control  BP: 126/76   Pt would like to stop hctz due to urinary frequency  inst to stop this and monitor bp at home  Plan to continue metoprolol xl 50 mg daily  We can inc this dose if needed

## 2020-11-10 DIAGNOSIS — G4733 Obstructive sleep apnea (adult) (pediatric): Secondary | ICD-10-CM | POA: Diagnosis not present

## 2020-11-18 ENCOUNTER — Other Ambulatory Visit: Payer: Self-pay | Admitting: Family Medicine

## 2020-11-18 ENCOUNTER — Ambulatory Visit: Payer: BC Managed Care – PPO | Admitting: Neurology

## 2020-11-30 ENCOUNTER — Other Ambulatory Visit: Payer: Self-pay | Admitting: Family Medicine

## 2020-12-11 DIAGNOSIS — G4733 Obstructive sleep apnea (adult) (pediatric): Secondary | ICD-10-CM | POA: Diagnosis not present

## 2020-12-12 ENCOUNTER — Telehealth: Payer: Self-pay | Admitting: Family Medicine

## 2020-12-12 DIAGNOSIS — Z0000 Encounter for general adult medical examination without abnormal findings: Secondary | ICD-10-CM

## 2020-12-12 DIAGNOSIS — E282 Polycystic ovarian syndrome: Secondary | ICD-10-CM

## 2020-12-12 DIAGNOSIS — E781 Pure hyperglyceridemia: Secondary | ICD-10-CM

## 2020-12-12 DIAGNOSIS — R7303 Prediabetes: Secondary | ICD-10-CM

## 2020-12-12 DIAGNOSIS — Z79899 Other long term (current) drug therapy: Secondary | ICD-10-CM | POA: Insufficient documentation

## 2020-12-12 DIAGNOSIS — I10 Essential (primary) hypertension: Secondary | ICD-10-CM

## 2020-12-12 NOTE — Telephone Encounter (Signed)
-----   Message from Ellamae Sia sent at 12/01/2020  9:21 AM EDT ----- Regarding: Lab orders for Monday, 9.26.22 Patient is scheduled for CPX labs, please order future labs, Thanks , Karna Christmas

## 2020-12-13 ENCOUNTER — Other Ambulatory Visit: Payer: BC Managed Care – PPO

## 2020-12-20 ENCOUNTER — Ambulatory Visit (INDEPENDENT_AMBULATORY_CARE_PROVIDER_SITE_OTHER): Payer: BC Managed Care – PPO | Admitting: Family Medicine

## 2020-12-20 ENCOUNTER — Other Ambulatory Visit: Payer: Self-pay

## 2020-12-20 ENCOUNTER — Encounter: Payer: Self-pay | Admitting: Family Medicine

## 2020-12-20 VITALS — BP 114/76 | HR 90 | Temp 97.8°F | Ht 64.0 in | Wt 259.0 lb

## 2020-12-20 DIAGNOSIS — Z0000 Encounter for general adult medical examination without abnormal findings: Secondary | ICD-10-CM

## 2020-12-20 DIAGNOSIS — I10 Essential (primary) hypertension: Secondary | ICD-10-CM

## 2020-12-20 DIAGNOSIS — Z79899 Other long term (current) drug therapy: Secondary | ICD-10-CM

## 2020-12-20 DIAGNOSIS — F411 Generalized anxiety disorder: Secondary | ICD-10-CM

## 2020-12-20 DIAGNOSIS — E119 Type 2 diabetes mellitus without complications: Secondary | ICD-10-CM

## 2020-12-20 DIAGNOSIS — E781 Pure hyperglyceridemia: Secondary | ICD-10-CM | POA: Diagnosis not present

## 2020-12-20 MED ORDER — METOPROLOL SUCCINATE ER 50 MG PO TB24: 50.0000 mg | ORAL_TABLET | Freq: Every day | ORAL | 3 refills | Status: DC

## 2020-12-20 MED ORDER — ATORVASTATIN CALCIUM 20 MG PO TABS
ORAL_TABLET | ORAL | 3 refills | Status: DC
Start: 2020-12-20 — End: 2022-01-30

## 2020-12-20 MED ORDER — ESCITALOPRAM OXALATE 10 MG PO TABS: 10.0000 mg | ORAL_TABLET | Freq: Every day | ORAL | 3 refills | Status: DC

## 2020-12-20 MED ORDER — FENOFIBRIC ACID 135 MG PO CPDR: 135.0000 mg | DELAYED_RELEASE_CAPSULE | Freq: Every day | ORAL | 3 refills | Status: DC

## 2020-12-20 NOTE — Progress Notes (Signed)
Subjective:    Patient ID: Gail [name], female    DOB: 1970/04/02, 50 y.o.   MRN: [id number]  This visit occurred during the SARS-CoV-2 public health emergency.  Safety protocols were in place, including screening questions prior to the visit, additional usage of staff PPE, and extensive cleaning of exam room while observing appropriate contact time as indicated for disinfecting solutions.   HPI Here for health maintenance exam and to review chronic medical problems    Wt Readings from Last 3 Encounters:  12/20/20 259 lb (117.5 kg)  10/18/20 260 lb 1 oz (118 kg)  09/23/20 261 lb 2 oz (118.4 kg)   44.46 kg/m  Less appetite with new medication  Looking to change    Colon cancer screening -did a colonoscopy about a year ago    S/p hysterectomy -has not had gyn visit   Mammogram 7/22 No lumps on self exam    Covid immunized Tdap 9/13 No flu shot -reaction to one in the past     HTN bp is stable today  No cp or palpitations or headaches or edema  No side effects to medicines  BP Readings from Last 3 Encounters:  12/20/20 114/76  10/18/20 126/76  09/23/20 122/80    Takes metoprolol xl 50 mg daily She has been tired  Pulse Readings from Last 3 Encounters:  12/20/20 90  10/18/20 80  09/23/20 71    DM2 w/o complication  Lab Results  Component Value Date   HGBA1C 7.1 (H) 10/24/2019   Due for labs  Sees Dr Forde Dandy for endocrinology  Was on glimiperide in the past   Started on rebelsys -makes her nauseated and will likely stop it  Is eating healthy  Unsure of A1C   Sugar intake is better   Lipids/high triglycerides Lab Results  Component Value Date   CHOL 138 10/24/2019   HDL 45.30 10/24/2019   LDLCALC 73 10/24/2019   LDLDIRECT 109.0 10/15/2018   TRIG 98.0 10/24/2019   CHOLHDL 3 10/24/2019  Fenofibrate 135 mg daily  Atorvastatin 20 mg daily  No greasy or fatty foods Seldom red meat     Mood  Takes lexapro 10 mg daily for anxiety   Depression screen [facility] 2/9 12/20/2020 11/04/2019 10/22/2018 09/26/2017 07/26/2015  Decreased Interest 0 0 0 0 0  Down, Depressed, Hopeless 0 0 0 0 0  PHQ - 2 Score 0 0 0 0 0  Altered sleeping - 0 1 - -  Tired, decreased energy - 1 0 - -  Change in appetite - 1 0 - -  Feeling bad or failure about yourself  - 0 0 - -  Trouble concentrating - 0 1 - -  Moving slowly or fidgety/restless - 0 0 - -  Suicidal thoughts - 0 0 - -  PHQ-9 Score - 2 2 - -  Difficult doing work/chores - Not difficult at all - - -   Takes protonix for GERD   Patient Active Problem List   Diagnosis Date Noted   Current use of proton pump inhibitor 12/12/2020   Urinary frequency 10/18/2020   Hypertension 08/24/2020   Headache 08/24/2020   Epigastric pain 08/26/2019   Screening mammogram, encounter for 10/22/2018   PCOS (polycystic ovarian syndrome) 09/26/2017   Status post total abdominal hysterectomy 12/19/2016   Insomnia 07/22/2016   Auditory processing disorder 04/04/2016   Encounter for routine gynecological examination 11/27/2014   Morbid obesity ([place]) 12/13/2012   Prediabetes 12/13/2012   Routine general medical  examination at a health care facility 12/04/2011   HYPERTRIGLYCERIDEMIA 04/07/2008   PROBLEMS WITH HEARING 04/07/2008   Anxiety disorder 02/07/2008   ALLERGIC RHINITIS 02/07/2008   HYPOGLYCEMIA, REACTIVE 11/15/2006   Past Medical History:  Diagnosis Date   Anemia    Anxiety    Depression    Diabetes mellitus without complication ([place])    type 2   Dyspnea    Hearing loss    wears hearing aids bilateral   History of allergic rhinitis    History of anxiety    History of blood transfusion    [facility] 09/2016 2 units transfused   History of depression    History of hyperlipidemia    History of syncope    Seizures (HCC)    hx seizures as a child,no seizures since age 30 yrs old   Sleep apnea    uses mouth guard at night   Past Surgical History:  Procedure Laterality Date   [place]   x 2   CYSTOSCOPY N/A 12/19/2016   Procedure: CYSTOSCOPY;  Surgeon: Nunzio Cobbs, MD;  Location: [facility];  Service: Gynecology;  Laterality: N/A;   TOTAL LAPAROSCOPIC HYSTERECTOMY WITH SALPINGECTOMY Bilateral 12/19/2016   Procedure: HYSTERECTOMY TOTAL LAPAROSCOPIC WITH SALPINGECTOMY;  Surgeon: Nunzio Cobbs, MD;  Location: [facility];  Service: Gynecology;  Laterality: Bilateral;   WISDOM TOOTH EXTRACTION     Social History   Tobacco Use   Smoking status: Never   Smokeless tobacco: Never  Vaping Use   Vaping Use: Never used  Substance Use Topics   Alcohol use: Yes    Alcohol/week: 0.0 standard drinks    Comment: occasional  winie   Drug use: No   Family History  Problem Relation Age of Onset   Diabetes Father    Diabetes Paternal Grandfather    Prostate cancer Paternal Grandfather    Diabetes Paternal Grandmother    Allergies  Allergen Reactions   Influenza Vaccine Recombinant     Vomiting    Metformin And Related     Nausea vomiting   Penicillins Hives    Has patient had a PCN reaction causing immediate rash, facial/tongue/throat swelling, SOB or lightheadedness with hypotension: Yes Has patient had a PCN reaction causing severe rash involving mucus membranes or skin necrosis: No Has patient had a PCN reaction that required hospitalization: No Has patient had a PCN reaction occurring within the last 10 years: No If all of the above answers are "NO", then may proceed with Cephalosporin use.    Current Outpatient Medications on File Prior to Visit  Medication Sig Dispense Refill   glucose blood (ONETOUCH VERIO) test strip USE TO CHECK BLOOD SUGAR ONCE DAILY AS NEEDED 50 strip 2   pantoprazole (PROTONIX) 40 MG tablet Take 1 tablet by mouth daily.     No current facility-administered medications on file prior to visit.     Review of Systems  Constitutional:  Positive for fatigue. Negative for activity change, appetite change, fever and  unexpected weight change.  HENT:  Negative for congestion, ear pain, rhinorrhea, sinus pressure and sore throat.   Eyes:  Negative for pain, redness and visual disturbance.  Respiratory:  Negative for cough, shortness of breath and wheezing.   Cardiovascular:  Negative for chest pain and palpitations.  Gastrointestinal:  Negative for abdominal pain, blood in stool, constipation and diarrhea.  Endocrine: Negative for polydipsia and polyuria.  Genitourinary:  Negative for dysuria, frequency and urgency.  Musculoskeletal:  Negative for arthralgias, back pain and myalgias.  Skin:  Negative for pallor and rash.  Allergic/Immunologic: Negative for environmental allergies.  Neurological:  Negative for dizziness, syncope and headaches.  Hematological:  Negative for adenopathy. Does not bruise/bleed easily.  Psychiatric/Behavioral:  Negative for decreased concentration and dysphoric mood. The patient is not nervous/anxious.       Objective:   Physical Exam Constitutional:      General: She is not in acute distress.    Appearance: Normal appearance. She is well-developed. She is obese. She is not ill-appearing or diaphoretic.  HENT:     Head: Normocephalic and atraumatic.     Right Ear: Tympanic membrane, ear canal and external ear normal.     Left Ear: Tympanic membrane, ear canal and external ear normal.     Nose: Nose normal. No congestion.     Mouth/Throat:     Mouth: Mucous membranes are moist.     Pharynx: Oropharynx is clear. No posterior oropharyngeal erythema.  Eyes:     General: No scleral icterus.    Extraocular Movements: Extraocular movements intact.     Conjunctiva/sclera: Conjunctivae normal.     Pupils: Pupils are equal, round, and reactive to light.  Neck:     Thyroid: No thyromegaly.     Vascular: No carotid bruit or JVD.  Cardiovascular:     Rate and Rhythm: Normal rate and regular rhythm.     Pulses: Normal pulses.     Heart sounds: Normal heart sounds.    No  gallop.  Pulmonary:     Effort: Pulmonary effort is normal. No respiratory distress.     Breath sounds: Normal breath sounds. No wheezing.     Comments: Good air exch Chest:     Chest wall: No tenderness.  Abdominal:     General: Bowel sounds are normal. There is no distension or abdominal bruit.     Palpations: Abdomen is soft. There is no mass.     Tenderness: There is no abdominal tenderness.     Hernia: No hernia is present.  Genitourinary:    Comments: Breast exam: No mass, nodules, thickening, tenderness, bulging, retraction, inflamation, nipple discharge or skin changes noted.  No axillary or clavicular LA.     Musculoskeletal:        General: No tenderness. Normal range of motion.     Cervical back: Normal range of motion and neck supple. No rigidity. No muscular tenderness.     Right lower leg: No edema.     Left lower leg: No edema.  Lymphadenopathy:     Cervical: No cervical adenopathy.  Skin:    General: Skin is warm and dry.     Coloration: Skin is not pale.     Findings: No erythema or rash.     Comments: Solar lentigines diffusely Mildly tanned  Few scabs/areas of scale on forearms    Neurological:     Mental Status: She is alert. Mental status is at baseline.     Cranial Nerves: No cranial nerve deficit.     Motor: No abnormal muscle tone.     Coordination: Coordination normal.     Gait: Gait normal.     Deep Tendon Reflexes: Reflexes are normal and symmetric. Reflexes normal.  Psychiatric:        Mood and Affect: Mood normal.        Cognition and Memory: Cognition and memory normal.     Comments: pleasant  Assessment & Plan:   Problem List Items Addressed This Visit       Cardiovascular and Mediastinum   Hypertension    bp in fair control at this time  BP Readings from Last 1 Encounters:  12/20/20 114/76  No changes needed Most recent labs reviewed  Disc lifstyle change with low sodium diet and exercise  Plan to continue metoprolol  xl 50 mg daily        Relevant Medications   atorvastatin (LIPITOR) 20 MG tablet   Choline Fenofibrate (FENOFIBRIC ACID) 135 MG CPDR   metoprolol succinate (TOPROL-XL) 50 MG 24 hr tablet     Endocrine   Controlled type 2 diabetes mellitus without complication, without long-term current use of insulin ([place])    Taking rebelsys from endocrinology and having some side eff and low glucose readings Plans to change it  Working on lifestyle change and wt loss       Relevant Medications   atorvastatin (LIPITOR) 20 MG tablet     Other   HYPERTRIGLYCERIDEMIA    Disc goals for lipids and reasons to control them Rev last labs with pt Rev low sat fat diet in detail Labs ordered Taking Fenofibrate 135 mg daily  Atorvastatin 20 mg daily  Tolerating well  Diet is fairly good      Relevant Medications   atorvastatin (LIPITOR) 20 MG tablet   Choline Fenofibrate (FENOFIBRIC ACID) 135 MG CPDR   metoprolol succinate (TOPROL-XL) 50 MG 24 hr tablet   Anxiety disorder    PHQ score is 0 Doing well with lexapro 10 mg daily Self care is better        Relevant Medications   escitalopram (LEXAPRO) 10 MG tablet   Routine general medical examination at a health care facility - Primary    Reviewed health habits including diet and exercise and skin cancer prevention Reviewed appropriate screening tests for age  Also reviewed health mt list, fam hx and immunization status , as well as social and family history   See HPI Labs ordered  Colonoscopy utd-report sent for Mammogram utd  covid immunized  Declines flu vaccine (reaction in the past) Enc f/u with endocrinology for blood sugar issues  Enc her to keep working on diet and exercise        Morbid obesity ([place])    Discussed how this problem influences overall health and the risks it imposes  Reviewed plan for weight loss with lower calorie diet (via better food choices and also portion control or program like weight watchers) and exercise  building up to or more than 30 minutes 5 days per week including some aerobic activity   Good progress so far  Working with her endocrinologist  Enc good water intake      Current use of proton pump inhibitor    Some fatigue  B12 and D levels ordered with labs

## 2020-12-20 NOTE — Assessment & Plan Note (Signed)
Discussed how this problem influences overall health and the risks it imposes  Reviewed plan for weight loss with lower calorie diet (via better food choices and also portion control or program like weight watchers) and exercise building up to or more than 30 minutes 5 days per week including some aerobic activity   Good progress so far  Working with her endocrinologist  Enc good water intake

## 2020-12-20 NOTE — Patient Instructions (Addendum)
Stop at check out please -we will send for your colonoscopy   Take care of yourself  Wear a mask during flu season   Drink lots of fluids Stay active  Use sunscreen  Eat healthy /low glycemic

## 2020-12-20 NOTE — Assessment & Plan Note (Signed)
Taking rebelsys from endocrinology and having some side eff and low glucose readings Plans to change it  Working on lifestyle change and wt loss

## 2020-12-20 NOTE — Assessment & Plan Note (Signed)
Reviewed health habits including diet and exercise and skin cancer prevention Reviewed appropriate screening tests for age  Also reviewed health mt list, fam hx and immunization status , as well as social and family history   See HPI Labs ordered  Colonoscopy utd-report sent for Mammogram utd  covid immunized  Declines flu vaccine (reaction in the past) Enc f/u with endocrinology for blood sugar issues  Enc her to keep working on diet and exercise

## 2020-12-20 NOTE — Assessment & Plan Note (Signed)
Some fatigue  B12 and D levels ordered with labs

## 2020-12-20 NOTE — Assessment & Plan Note (Signed)
bp in fair control at this time  BP Readings from Last 1 Encounters:  12/20/20 114/76   No changes needed Most recent labs reviewed  Disc lifstyle change with low sodium diet and exercise  Plan to continue metoprolol xl 50 mg daily

## 2020-12-20 NOTE — Assessment & Plan Note (Signed)
PHQ score is 0 Doing well with lexapro 10 mg daily Self care is better

## 2020-12-20 NOTE — Assessment & Plan Note (Signed)
Disc goals for lipids and reasons to control them Rev last labs with pt Rev low sat fat diet in detail Labs ordered Taking Fenofibrate 135 mg daily  Atorvastatin 20 mg daily  Tolerating well  Diet is fairly good

## 2020-12-21 ENCOUNTER — Encounter: Payer: Self-pay | Admitting: Family Medicine

## 2020-12-21 DIAGNOSIS — E538 Deficiency of other specified B group vitamins: Secondary | ICD-10-CM | POA: Insufficient documentation

## 2020-12-21 LAB — CBC WITH DIFFERENTIAL/PLATELET
Basophils Absolute: 0.1 10*3/uL (ref 0.0–0.1)
Basophils Relative: 0.9 % (ref 0.0–3.0)
Eosinophils Absolute: 0.1 10*3/uL (ref 0.0–0.7)
Eosinophils Relative: 1.8 % (ref 0.0–5.0)
HCT: 39.4 % (ref 36.0–46.0)
Hemoglobin: 13.2 g/dL (ref 12.0–15.0)
Lymphocytes Relative: 43.9 % (ref 12.0–46.0)
Lymphs Abs: 2.8 10*3/uL (ref 0.7–4.0)
MCHC: 33.6 g/dL (ref 30.0–36.0)
MCV: 86.7 fl (ref 78.0–100.0)
Monocytes Absolute: 0.3 10*3/uL (ref 0.1–1.0)
Monocytes Relative: 4.7 % (ref 3.0–12.0)
Neutro Abs: 3.1 10*3/uL (ref 1.4–7.7)
Neutrophils Relative %: 48.7 % (ref 43.0–77.0)
Platelets: 234 10*3/uL (ref 150.0–400.0)
RBC: 4.54 Mil/uL (ref 3.87–5.11)
RDW: 13.5 % (ref 11.5–15.5)
WBC: 6.3 10*3/uL (ref 4.0–10.5)

## 2020-12-21 LAB — LIPID PANEL
Cholesterol: 111 mg/dL (ref 0–200)
HDL: 39.8 mg/dL (ref >39.00)
LDL Cholesterol: 51 mg/dL (ref 0–99)
NonHDL: 71.47
Total CHOL/HDL Ratio: 3
Triglycerides: 100 mg/dL (ref 0.0–149.0)
VLDL: 20 mg/dL (ref 0.0–40.0)

## 2020-12-21 LAB — VITAMIN D 25 HYDROXY (VIT D DEFICIENCY, FRACTURES): VITD: 26.86 ng/mL — ABNORMAL LOW (ref 30.00–100.00)

## 2020-12-21 LAB — COMPREHENSIVE METABOLIC PANEL
ALT: 22 U/L (ref 0–35)
AST: 19 U/L (ref 0–37)
Albumin: 4.5 g/dL (ref 3.5–5.2)
Alkaline Phosphatase: 48 U/L (ref 39–117)
BUN: 13 mg/dL (ref 6–23)
CO2: 26 mEq/L (ref 19–32)
Calcium: 9.1 mg/dL (ref 8.4–10.5)
Chloride: 104 mEq/L (ref 96–112)
Creatinine, Ser: 0.82 mg/dL (ref 0.40–1.20)
GFR: 84.18 mL/min (ref >60.00)
Glucose, Bld: 129 mg/dL — ABNORMAL HIGH (ref 70–99)
Potassium: 3.9 mEq/L (ref 3.5–5.1)
Sodium: 140 mEq/L (ref 135–145)
Total Bilirubin: 0.4 mg/dL (ref 0.2–1.2)
Total Protein: 6.6 g/dL (ref 6.0–8.3)

## 2020-12-21 LAB — VITAMIN B12: Vitamin B-12: 173 pg/mL — ABNORMAL LOW (ref 211–911)

## 2020-12-21 LAB — TSH: TSH: 0.97 u[IU]/mL (ref 0.35–5.50)

## 2020-12-22 ENCOUNTER — Telehealth: Payer: Self-pay | Admitting: Family Medicine

## 2020-12-22 NOTE — Telephone Encounter (Signed)
Completed encounter in result management.

## 2020-12-22 NOTE — Telephone Encounter (Signed)
Pt returning call

## 2020-12-28 DIAGNOSIS — E119 Type 2 diabetes mellitus without complications: Secondary | ICD-10-CM | POA: Diagnosis not present

## 2021-01-10 DIAGNOSIS — G4733 Obstructive sleep apnea (adult) (pediatric): Secondary | ICD-10-CM | POA: Diagnosis not present

## 2021-01-25 NOTE — Progress Notes (Signed)
NEUROLOGY CONSULTATION NOTE  Gail [name] MRN: [id number] DOB: Sep 09, 1970  Referring provider: Loura Pardon, MD Primary care provider: Loura Pardon, MD  Reason for consult:  headache  Assessment/Plan:  1  Probable migraine without aura, without status migrainosus, not intractable - seemed to improve with addition of metoprolol to treat blood pressure; however, given that these are new onset, will evaluate for other secondary etiologies.   2  Hypertension  MRI brain with and without contrast and MRA head Sed rate and CRP Headache prevention:  Start topiramate 25mg  at bedtime.  We can increase to 50mg  at bedtime in 4 weeks if needed. Limit use of pain relievers (Tylenol, ibuprofen) to no more than 2 days out of week to prevent risk of rebound or medication-overuse headache. Keep headache diary Follow up 6 months.   Subjective:  Gail [name] is a 50 year old left-handed female with HTN, DM II, GAD, depression, sleep apnea and history of childhood seizures who presents for headaches.  History supplemented by referring provider's note.  Onset:  June 2022.  She was evaluated in the ED June.  CT head personally reviewed was normal.  She was found to have elevated blood pressure and started on medicaton.   Location:  left-sided Quality:  pulsating progressing to pressure Intensity:  3/10 Aura:  absent Prodrome:  absent Associated symptoms:  Nausea, phonophobia, blurred vision, left ptosis.  She denies associated vomiting, photophobia, other Prautonomic symptoms, unilateral numbness or weakness. Duration:  5-6 hours  Frequency:  2 days a week Frequency of abortive medication: ibuprofen 2 days a week Triggers:  Fatigue Relieving factors:  rest Activity:  movement aggravates  She has had an eye exam that was unremarkable. Reports prior history of headache, very infrequent (once a year)  Current NSAIDS/analgesics:  ibuprofen Current triptans:  none Current ergotamine:   none Current anti-emetic:  none Current muscle relaxants:  none Current Antihypertensive medications:  metoprolol succinate 50mg  Current Antidepressant medications:  escitalopram 10mg  daily Current Anticonvulsant medications:  none Current anti-CGRP:  none Current Vitamins/Herbal/Supplements:  none Current Antihistamines/Decongestants:  none Other therapy:  none Hormone/birth control:  none   Past NSAIDS/analgesics:  Tylenol Past abortive triptans:  none Past abortive ergotamine:  none Past muscle relaxants:  none Past anti-emetic:  none Past antihypertensive medications:  HCTZ Past antidepressant medications:  none Past anticonvulsant medications:  none Past anti-CGRP:  none Past vitamins/Herbal/Supplements:  none Past antihistamines/decongestants:  none Other past therapies:  none  Caffeine:  1 can of Coke daily.  No coffee Diet:  6 bottles of water daily.  Does not skip meals Exercise:  walk Depression:  controlled; Anxiety:  a little Other pain:  no Sleep:  OK Family history of headache:  mother had headaches secondary to an AVM; sister (migraines)      PAST MEDICAL HISTORY: Past Medical History:  Diagnosis Date   Anemia    Anxiety    Depression    Diabetes mellitus without complication (HCC)    type 2   Dyspnea    Hearing loss    wears hearing aids bilateral   History of allergic rhinitis    History of anxiety    History of blood transfusion    [facility] 09/2016 2 units transfused   History of depression    History of hyperlipidemia    History of syncope    Seizures (HCC)    hx seizures as a child,no seizures since age 74 yrs old   Sleep apnea  uses mouth guard at night    PAST SURGICAL HISTORY: Past Surgical History:  Procedure Laterality Date   [place]   x 2   CYSTOSCOPY N/A 12/19/2016   Procedure: CYSTOSCOPY;  Surgeon: Nunzio Cobbs, MD;  Location: [facility];  Service: Gynecology;  Laterality: N/A;   TOTAL LAPAROSCOPIC  HYSTERECTOMY WITH SALPINGECTOMY Bilateral 12/19/2016   Procedure: HYSTERECTOMY TOTAL LAPAROSCOPIC WITH SALPINGECTOMY;  Surgeon: Nunzio Cobbs, MD;  Location: [facility];  Service: Gynecology;  Laterality: Bilateral;   WISDOM TOOTH EXTRACTION      MEDICATIONS: Current Outpatient Medications on File Prior to Visit  Medication Sig Dispense Refill   atorvastatin (LIPITOR) 20 MG tablet TAKE 1 TABLET BY MOUTH EVERYDAY AT BEDTIME 90 tablet 3   Choline Fenofibrate (FENOFIBRIC ACID) 135 MG CPDR Take 135 mg by mouth at bedtime. 90 capsule 3   escitalopram (LEXAPRO) 10 MG tablet Take 1 tablet (10 mg total) by mouth daily. 90 tablet 3   glucose blood (ONETOUCH VERIO) test strip USE TO CHECK BLOOD SUGAR ONCE DAILY AS NEEDED 50 strip 2   metoprolol succinate (TOPROL-XL) 50 MG 24 hr tablet Take 1 tablet (50 mg total) by mouth daily. Take with or immediately following a meal. 90 tablet 3   pantoprazole (PROTONIX) 40 MG tablet Take 1 tablet by mouth daily.     No current facility-administered medications on file prior to visit.    ALLERGIES: Allergies  Allergen Reactions   Influenza Vaccine Recombinant     Vomiting    Metformin And Related     Nausea vomiting   Penicillins Hives    Has patient had a PCN reaction causing immediate rash, facial/tongue/throat swelling, SOB or lightheadedness with hypotension: Yes Has patient had a PCN reaction causing severe rash involving mucus membranes or skin necrosis: No Has patient had a PCN reaction that required hospitalization: No Has patient had a PCN reaction occurring within the last 10 years: No If all of the above answers are "NO", then may proceed with Cephalosporin use.     FAMILY HISTORY: Family History  Problem Relation Age of Onset   Diabetes Father    Diabetes Paternal Grandfather    Prostate cancer Paternal Grandfather    Diabetes Paternal Grandmother     Objective:  Blood pressure 120/84, pulse 78, height 5\' 5"  (1.651 m), weight  257 lb (116.6 kg), last menstrual period 12/19/2016, SpO2 99 %. General: No acute distress.  Patient appears well-groomed.   Head:  Normocephalic/atraumatic Eyes:  fundi examined but not visualized Neck: supple, no paraspinal tenderness, full range of motion Back: No paraspinal tenderness Heart: regular rate and rhythm Lungs: Clear to auscultation bilaterally. Vascular: No carotid bruits. Neurological Exam: Mental status: alert and oriented to person, place, and time, recent and remote memory intact, fund of knowledge intact, attention and concentration intact, speech fluent and not dysarthric, language intact. Cranial nerves: CN I: not tested CN II: pupils equal, round and reactive to light, visual fields intact CN III, IV, VI:  full range of motion, no nystagmus, no ptosis CN V: facial sensation intact. CN VII: upper and lower face symmetric CN VIII: hearing intact CN IX, X: gag intact, uvula midline CN XI: sternocleidomastoid and trapezius muscles intact CN XII: tongue midline Bulk & Tone: normal, no fasciculations. Motor:  muscle strength 5/5 throughout Sensation:  Pinprick, temperature and vibratory sensation intact. Deep Tendon Reflexes:  2+ throughout,  toes downgoing.   Finger to nose testing:  Without dysmetria.   Heel to shin:  Without dysmetria.   Gait:  Normal station and stride.  Romberg negative.    Thank you for allowing me to take part in the care of this patient.  Metta Clines, DO  CC: Loura Pardon, MD

## 2021-01-26 ENCOUNTER — Other Ambulatory Visit (INDEPENDENT_AMBULATORY_CARE_PROVIDER_SITE_OTHER): Payer: BC Managed Care – PPO

## 2021-01-26 ENCOUNTER — Other Ambulatory Visit: Payer: Self-pay

## 2021-01-26 ENCOUNTER — Encounter: Payer: Self-pay | Admitting: Neurology

## 2021-01-26 ENCOUNTER — Ambulatory Visit: Payer: BC Managed Care – PPO | Admitting: Neurology

## 2021-01-26 VITALS — BP 120/84 | HR 78 | Ht 65.0 in | Wt 257.0 lb

## 2021-01-26 DIAGNOSIS — R519 Headache, unspecified: Secondary | ICD-10-CM

## 2021-01-26 DIAGNOSIS — I10 Essential (primary) hypertension: Secondary | ICD-10-CM

## 2021-01-26 LAB — C-REACTIVE PROTEIN: CRP: <1 mg/dL (ref 0.5–20.0)

## 2021-01-26 LAB — SEDIMENTATION RATE: Sed Rate: 8 mm/hr (ref 0–20)

## 2021-01-26 MED ORDER — TOPIRAMATE 25 MG PO TABS: 25.0000 mg | ORAL_TABLET | Freq: Every day | ORAL | 5 refills | Status: DC

## 2021-01-26 NOTE — Patient Instructions (Addendum)
  Check MRI of brain with and without contrast and MRA of head Check sed rate and CRP Start topiramate 25mg  at bedtime.  Contact us in 4 weeks with update and we can increase dose if needed. Limit use of pain relievers to no more than 2 days out of the week.  These medications include acetaminophen, NSAIDs (ibuprofen/Advil/Motrin, naproxen/Aleve, triptans (Imitrex/sumatriptan), Excedrin, and narcotics.  This will help reduce risk of rebound headaches. Be aware of common food triggers:  - Caffeine:  coffee, black tea, cola, Mt. Dew  - Chocolate  - Dairy:  aged cheeses (brie, blue, cheddar, gouda, [place], provolone, [place], Swiss, etc), chocolate milk, buttermilk, sour cream, limit eggs and yogurt  - Nuts, peanut butter  - Alcohol  - Cereals/grains:  FRESH breads (fresh bagels, sourdough, doughnuts), yeast productions  - Processed/canned/aged/cured meats (pre-packaged deli meats, hotdogs)  - MSG/glutamate:  soy sauce, flavor enhancer, pickled/preserved/marinated foods  - Sweeteners:  aspartame (Equal, Nutrasweet).  Sugar and Splenda are okay  - Vegetables:  legumes (lima beans, lentils, snow peas, fava beans, pinto peans, peas, garbanzo beans), sauerkraut, onions, olives, pickles  - Fruit:  avocados, bananas, citrus fruit (orange, lemon, grapefruit), mango  - Other:  Frozen meals, macaroni and cheese Routine exercise Stay adequately hydrated (aim for 64 oz water daily) Keep headache diary Maintain proper stress management Maintain proper sleep hygiene Do not skip meals Consider supplements:  magnesium citrate 400mg  daily, riboflavin 400mg  daily, coenzyme Q10 100mg  three times daily. Your provider has requested that you have labwork completed today. Please go to [facility] ([id number]) on the second floor of this building before leaving the office today. You do not need to check in. If you are not called within 15 minutes please check with the front desk.   We have sent a referral  to [place] for your MRI and they will call you directly to schedule your appointment. They are located at [address]. If you need to contact them directly please call [phone].

## 2021-02-02 ENCOUNTER — Telehealth: Payer: Self-pay | Admitting: Neurology

## 2021-02-02 NOTE — Telephone Encounter (Signed)
Pt returning a call to someone about her results

## 2021-02-02 NOTE — Telephone Encounter (Signed)
Per christy pt advised.

## 2021-02-17 ENCOUNTER — Ambulatory Visit
Admission: RE | Admit: 2021-02-17 | Discharge: 2021-02-17 | Disposition: A | Discharge status: Home / Self Care | Payer: BC Managed Care – PPO | Source: Ambulatory Visit | Attending: Neurology | Admitting: Neurology

## 2021-02-17 DIAGNOSIS — R519 Headache, unspecified: Secondary | ICD-10-CM | POA: Diagnosis not present

## 2021-02-17 IMAGING — MR MR MRA HEAD W/O CM
1 series · 10 of 48 positions shown · IV contrast (multihance)
Comparison: Head CT [DATE].

CLINICAL DATA: New onset headache. Primary hypertension. New onset
headaches. Additional history provided by scanning technologist:
Patient reports a family history of AVM (mother), severe headaches,
increased blood pressure for 7 months.

EXAM:
MRI HEAD WITHOUT AND WITH CONTRAST
MRA HEAD WITHOUT CONTRAST
TECHNIQUE: Multiplanar, multi-echo pulse sequences of the brain and surrounding
structures were acquired without and with intravenous contrast.
Angiographic images of the Circle of Willis were acquired using MRA
technique without intravenous contrast.
CONTRAST:  20mL MULTIHANCE GADOBENATE DIMEGLUMINE 529 MG/ML IV SOLN

[Series 9: [id number]_multi-slab · axial · 0.6mm · 0.26mm/px · z∈[-63,+6]mm · 10 of 149 slices shown]
[im 10/149]
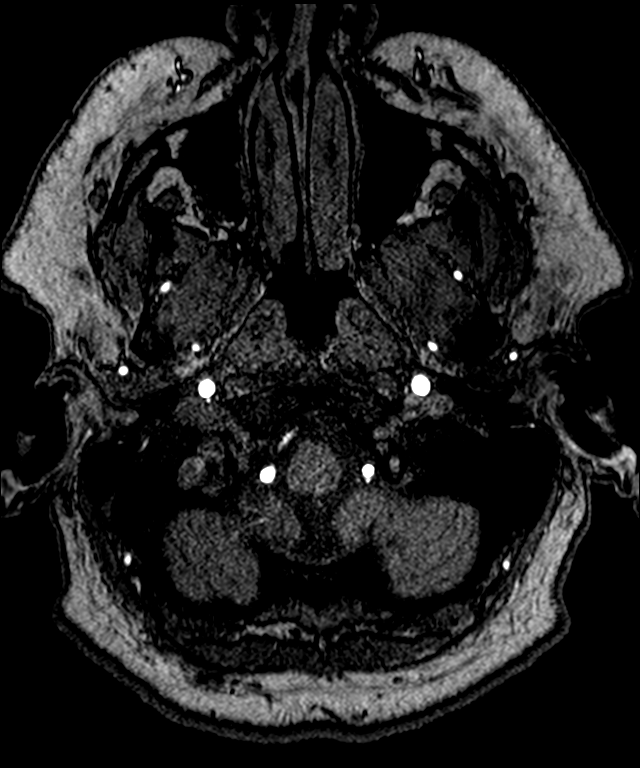
[im 26/149]
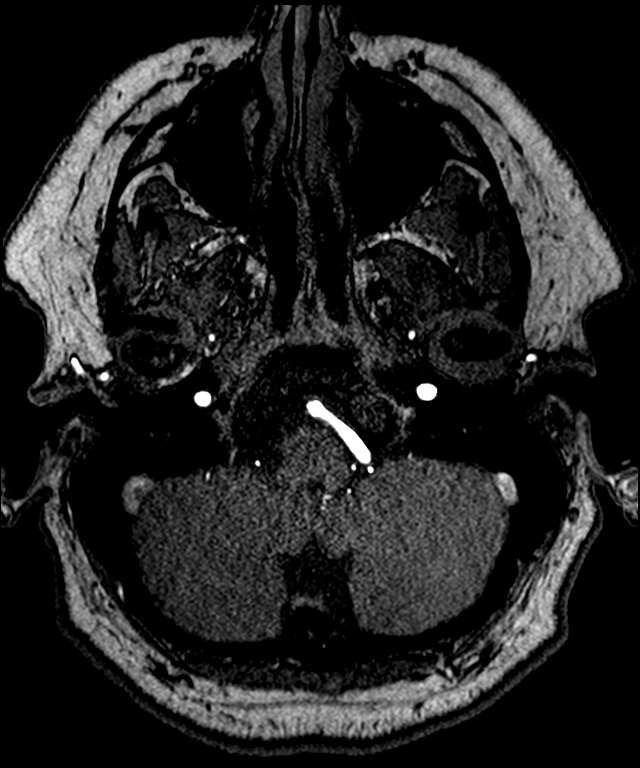
[im 29/149]
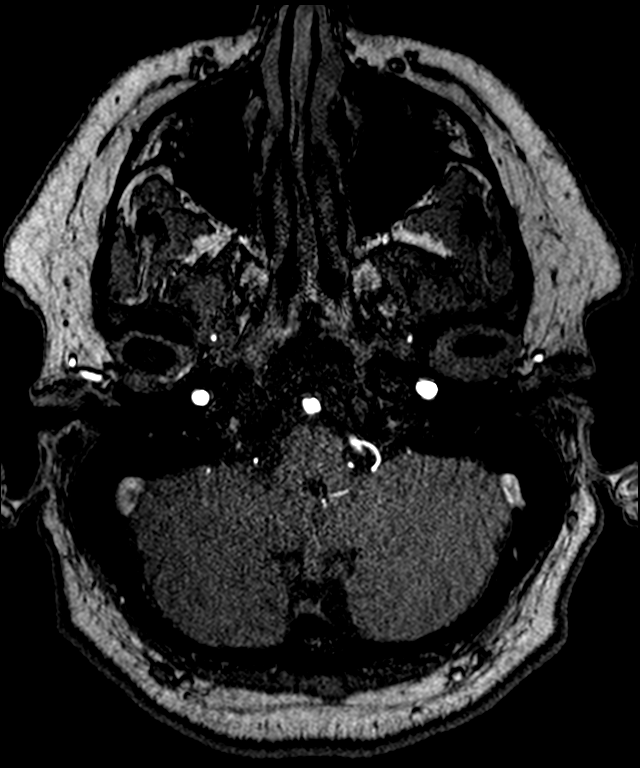
[im 48/149]
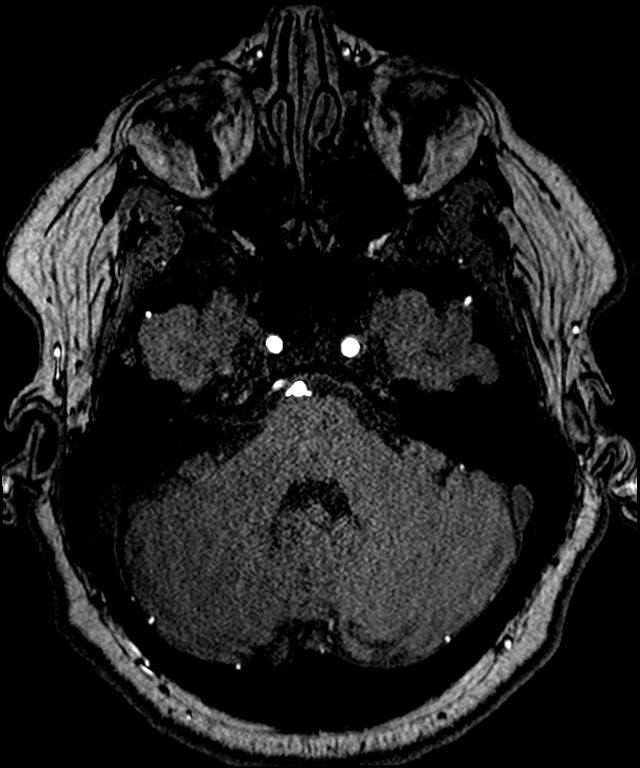
[im 67/149]
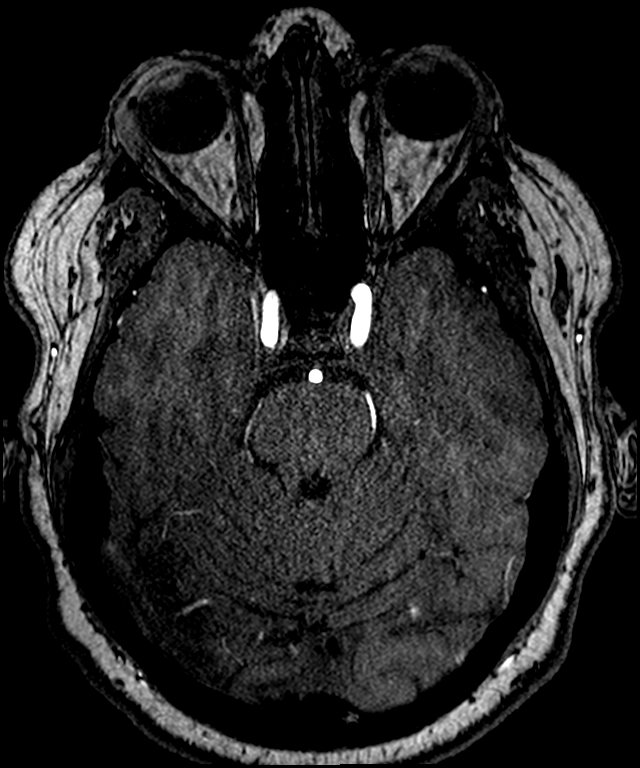
[im 76/149]
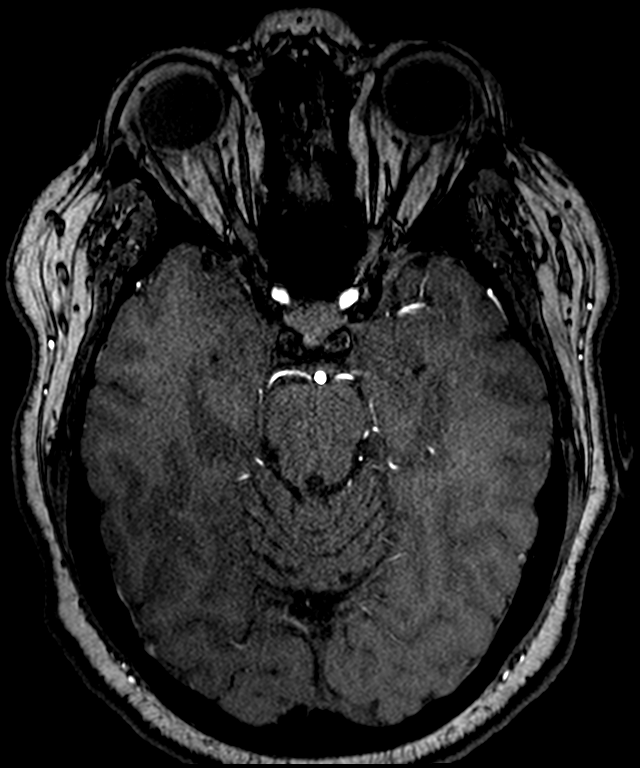
[im 86/149]
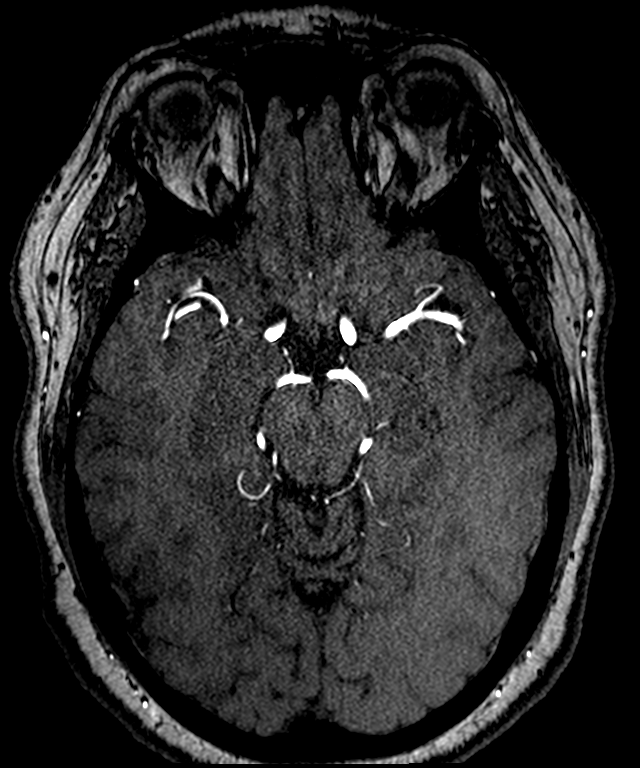
[im 104/149]
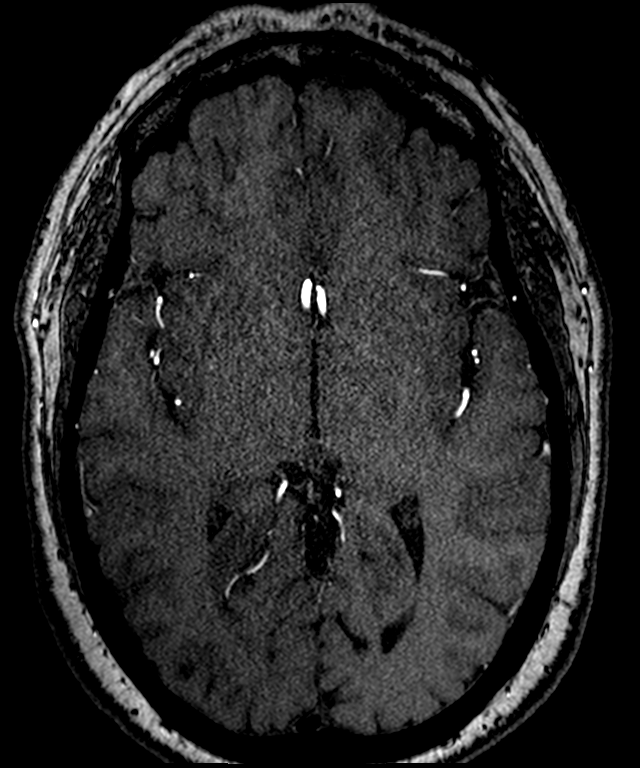
[im 123/149]
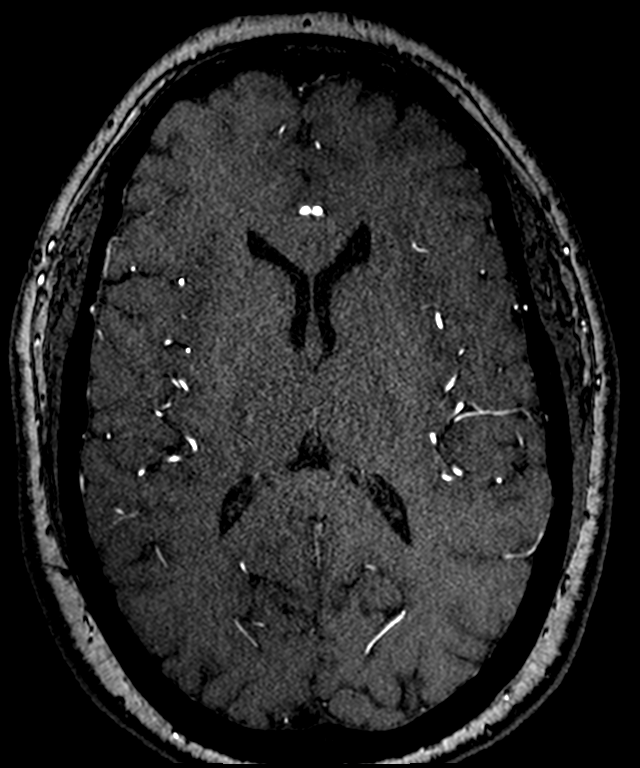
[im 126/149]
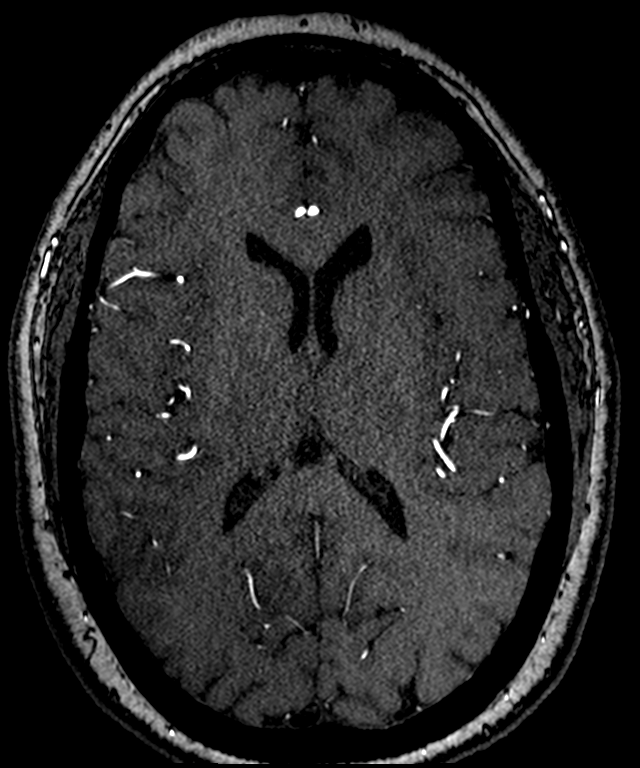

[10 of 48 positions shown; findings below may reference images not displayed]

FINDINGS: MRI HEAD FINDINGS

Brain:

Mild intermittent motion.

Cerebral volume is normal for age.

No cortical encephalomalacia is identified. No significant cerebral
white matter disease.

There is no acute infarct.

No evidence of an intracranial mass.

No chronic intracranial blood products.

No extra-axial fluid collection.

No midline shift.

No pathologic intracranial enhancement identified.

Vascular: Maintained flow voids within the proximal large arterial
vessels. Developmental venous anomaly within the right
insula/subinsular white matter (anatomic variant).

Skull and upper cervical spine: No focal suspicious marrow lesion.

Sinuses/Orbits: Visualized orbits show no acute finding. Mild
mucosal thickening within the right maxillary sinus.

MRA HEAD FINDINGS

Anterior circulation:

The intracranial internal carotid arteries are patent. The M1 middle
cerebral arteries are patent. No M2 proximal branch occlusion or
high-grade proximal stenosis is identified. The anterior cerebral
arteries are patent. Hypoplastic right A1 segment. No intracranial
aneurysm or AVM is identified.

Posterior circulation:

The intracranial vertebral arteries are patent. The basilar artery
is patent. The posterior cerebral arteries are patent. Small
posterior communicating arteries are present bilaterally.

Anatomic variants: As described.
IMPRESSION: MRI brain:

1. Mildly motion degraded exam.
2. Unremarkable MRI appearance of the brain. No evidence of acute
intracranial abnormality.
3. Mild mucosal thickening within the right maxillary sinus.

MRA head:

1. No intracranial large vessel occlusion or proximal high-grade
arterial stenosis.
2. No intracranial aneurysm or AVM is identified.

## 2021-02-17 IMAGING — MR MR HEAD WO/W CM
13 series · 48 of 48 positions shown · IV contrast (20 ml multihance)
Comparison: Head CT [DATE].

CLINICAL DATA: New onset headache. Primary hypertension. New onset
headaches. Additional history provided by scanning technologist:
Patient reports a family history of AVM (mother), severe headaches,
increased blood pressure for 7 months.

EXAM:
MRI HEAD WITHOUT AND WITH CONTRAST
MRA HEAD WITHOUT CONTRAST
TECHNIQUE: Multiplanar, multi-echo pulse sequences of the brain and surrounding
structures were acquired without and with intravenous contrast.
Angiographic images of the Circle of Willis were acquired using MRA
technique without intravenous contrast.
CONTRAST:  20mL MULTIHANCE GADOBENATE DIMEGLUMINE 529 MG/ML IV SOLN

[Series 5: T1 · sagittal · 4.0mm · 0.75mm/px · 1 of 31 slices shown (1 of 3)]
[im 1/31]
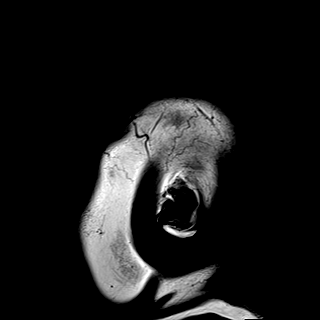

[Series 6: DWI · axial · 3.0mm · 0.94mm/px · z∈[-63,+74]mm · 8 of 160 slices shown (1 of 3)]
[im 1/160]
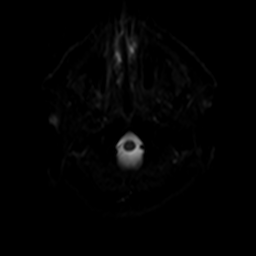
[im 23/160]
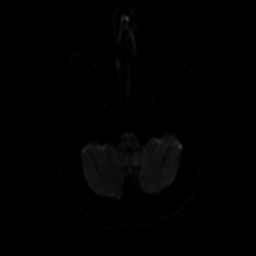
[im 46/160]
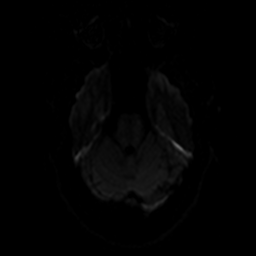
[im 69/160]
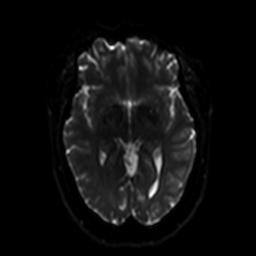
[im 91/160]
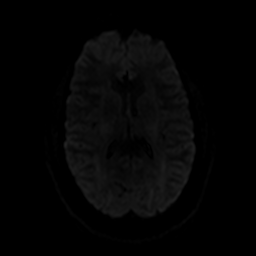
[im 114/160]
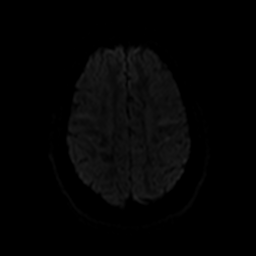
[im 137/160]
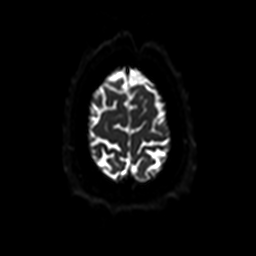
[im 160/160]
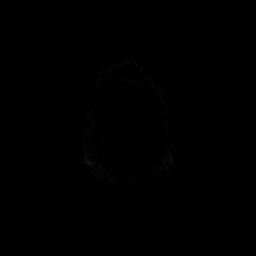

[Series 7: ax dwi_tracew · axial · 3.0mm · 0.94mm/px · z∈[-63,+74]mm · 4 of 80 slices shown]
[im 1/80]
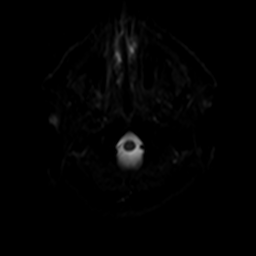
[im 27/80]
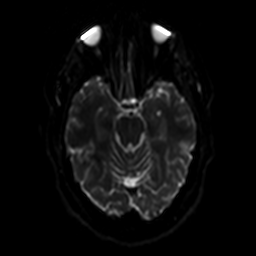
[im 53/80]
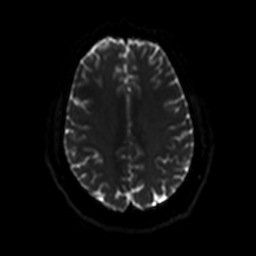
[im 80/80]
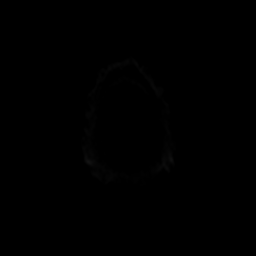

[Series 8: ax dwi_adc · axial · 3.0mm · 0.94mm/px · z∈[-63,+74]mm · 2 of 40 slices shown]
[im 1/40]
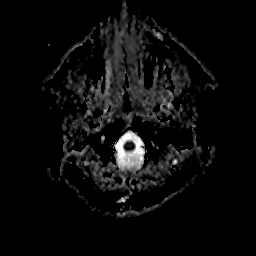
[im 40/40]
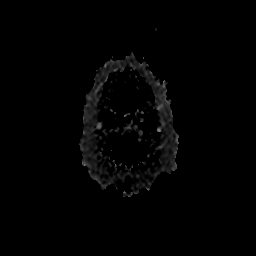

[Series 9: DWI · coronal · 5.0mm · 1.44mm/px · 3 of 66 slices shown (2 of 3)]
[im 1/66]
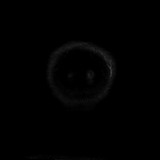
[im 33/66]
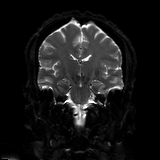
[im 66/66]
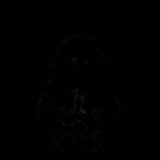

[Series 10: DWI · coronal · 5.0mm · 1.44mm/px · 2 of 33 slices shown (3 of 3)]
[im 1/33]
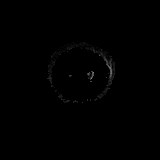
[im 33/33]
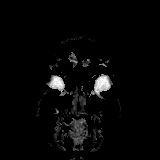

[Series 11: T2 · axial · 4.0mm · 0.36mm/px · z∈[-75,+73]mm · 2 of 30 slices shown]
[im 1/30]
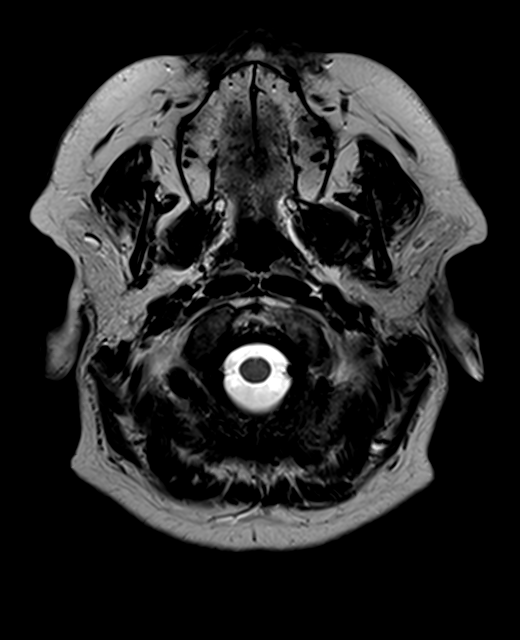
[im 30/30]
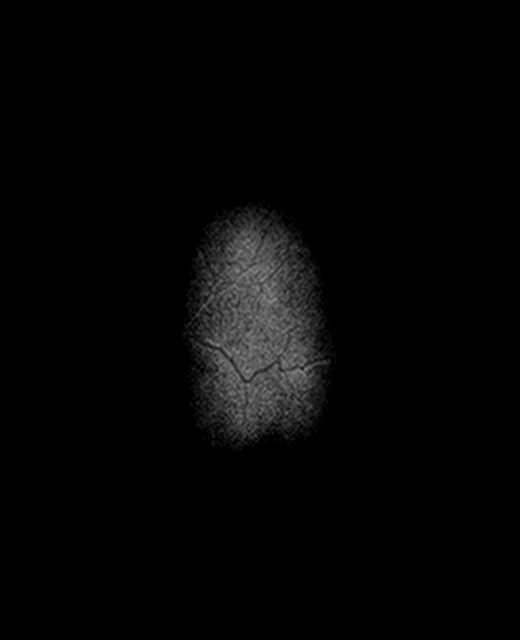

[Series 12: FLAIR · axial · 3.0mm · 0.72mm/px · 1 of 26 slices shown]
[im 1/26]
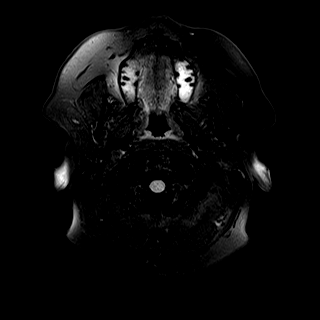

[Series 13: swi_images · axial · 1.5mm · 0.90mm/px · z∈[-71,+70]mm · 5 of 96 slices shown]
[im 1/96]
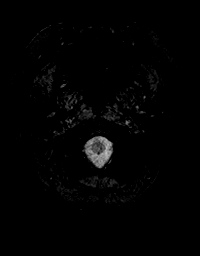
[im 24/96]
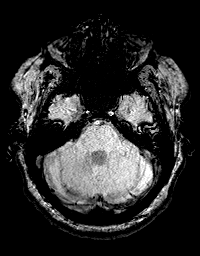
[im 48/96]
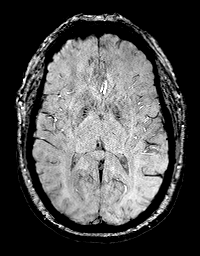
[im 72/96]
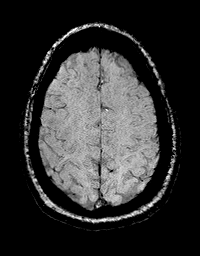
[im 96/96]
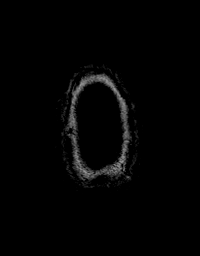

[Series 15: T1 · axial · 1.0mm · 0.94mm/px · z∈[-78,+78]mm · 8 of 160 slices shown (2 of 3)]
[im 1/160]
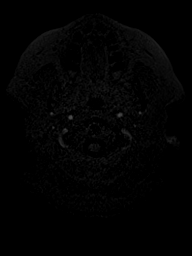
[im 23/160]
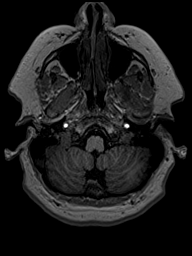
[im 46/160]
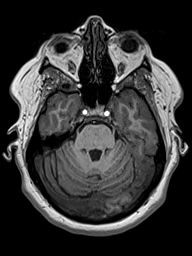
[im 69/160]
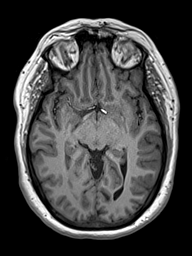
[im 91/160]
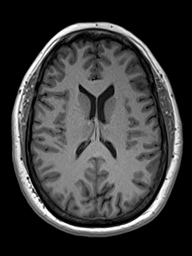
[im 114/160]
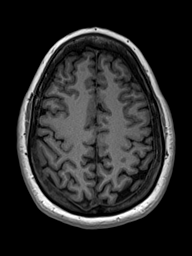
[im 137/160]
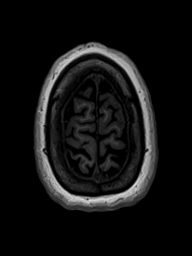
[im 160/160]
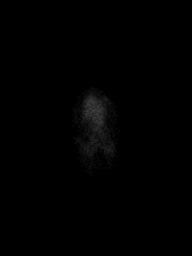

[Series 16: T2 post-contrast · coronal · 4.5mm · 0.36mm/px · 2 of 35 slices shown]
[im 1/35]
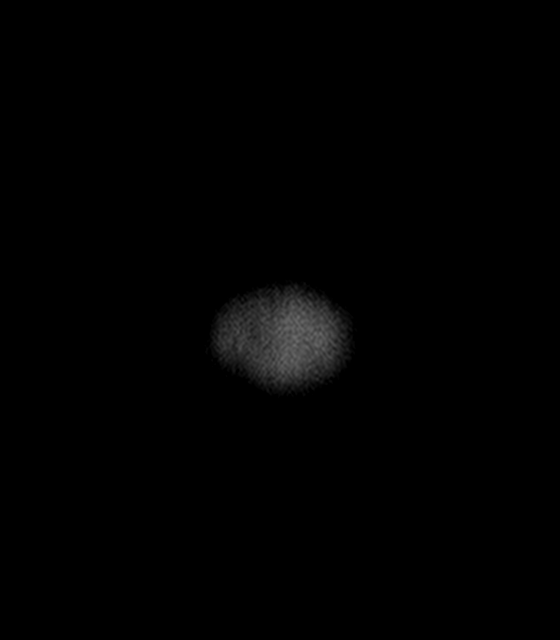
[im 35/35]
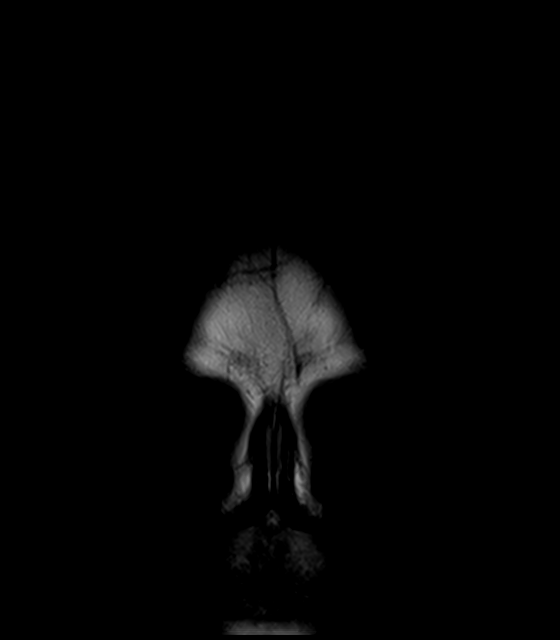

[Series 17: T1 · axial · 1.0mm · 0.94mm/px · z∈[-78,+78]mm · 8 of 160 slices shown (3 of 3)]
[im 1/160]
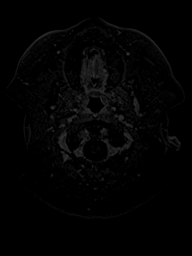
[im 23/160]
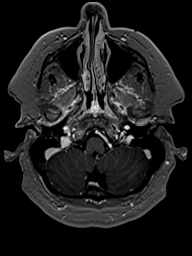
[im 46/160]
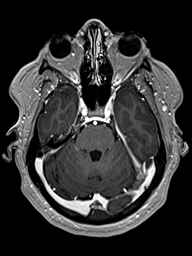
[im 69/160]
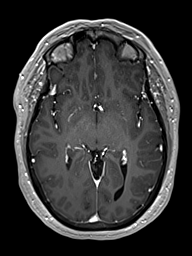
[im 91/160]
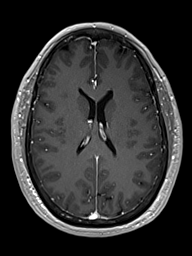
[im 114/160]
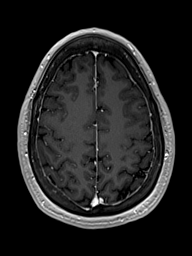
[im 137/160]
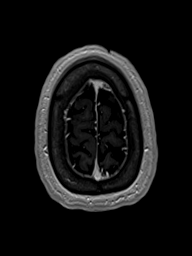
[im 160/160]
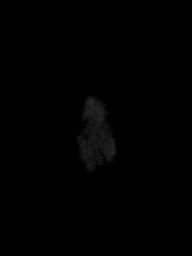

[Series 18: T1 post-contrast · coronal · 4.5mm · 0.72mm/px · 2 of 35 slices shown]
[im 1/35]
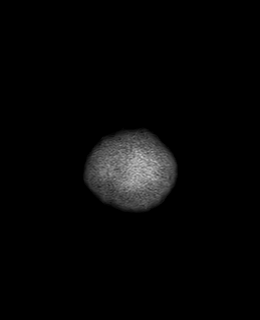
[im 35/35]
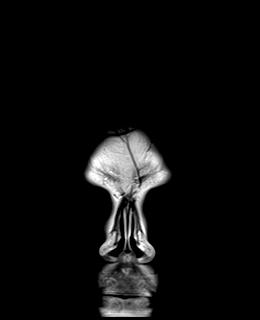

[48 of 48 positions shown; findings below may reference images not displayed]

FINDINGS: MRI HEAD FINDINGS

Brain:

Mild intermittent motion.

Cerebral volume is normal for age.

No cortical encephalomalacia is identified. No significant cerebral
white matter disease.

There is no acute infarct.

No evidence of an intracranial mass.

No chronic intracranial blood products.

No extra-axial fluid collection.

No midline shift.

No pathologic intracranial enhancement identified.

Vascular: Maintained flow voids within the proximal large arterial
vessels. Developmental venous anomaly within the right
insula/subinsular white matter (anatomic variant).

Skull and upper cervical spine: No focal suspicious marrow lesion.

Sinuses/Orbits: Visualized orbits show no acute finding. Mild
mucosal thickening within the right maxillary sinus.

MRA HEAD FINDINGS

Anterior circulation:

The intracranial internal carotid arteries are patent. The M1 middle
cerebral arteries are patent. No M2 proximal branch occlusion or
high-grade proximal stenosis is identified. The anterior cerebral
arteries are patent. Hypoplastic right A1 segment. No intracranial
aneurysm or AVM is identified.

Posterior circulation:

The intracranial vertebral arteries are patent. The basilar artery
is patent. The posterior cerebral arteries are patent. Small
posterior communicating arteries are present bilaterally.

Anatomic variants: As described.
IMPRESSION: MRI brain:

1. Mildly motion degraded exam.
2. Unremarkable MRI appearance of the brain. No evidence of acute
intracranial abnormality.
3. Mild mucosal thickening within the right maxillary sinus.

MRA head:

1. No intracranial large vessel occlusion or proximal high-grade
arterial stenosis.
2. No intracranial aneurysm or AVM is identified.

## 2021-02-17 MED ORDER — GADOBENATE DIMEGLUMINE 529 MG/ML IV SOLN
20.0000 mL | Freq: Once | INTRAVENOUS | Status: AC | PRN
  Administered 2021-02-17: 20 mL via INTRAVENOUS

## 2021-02-18 NOTE — Progress Notes (Signed)
Pt advised of her MRI Brain Resuls. Pt states her headache are doing better no need to increase to 50 mg right now.

## 2021-03-25 DIAGNOSIS — H6692 Otitis media, unspecified, left ear: Secondary | ICD-10-CM | POA: Diagnosis not present

## 2021-03-29 ENCOUNTER — Telehealth (INDEPENDENT_AMBULATORY_CARE_PROVIDER_SITE_OTHER): Payer: BC Managed Care – PPO | Admitting: Family Medicine

## 2021-03-29 ENCOUNTER — Encounter: Payer: Self-pay | Admitting: Family Medicine

## 2021-03-29 ENCOUNTER — Other Ambulatory Visit: Payer: Self-pay

## 2021-03-29 DIAGNOSIS — J069 Acute upper respiratory infection, unspecified: Secondary | ICD-10-CM | POA: Insufficient documentation

## 2021-03-29 MED ORDER — FLUTICASONE PROPIONATE 50 MCG/ACT NA SUSP: 2.0000 | Freq: Every day | NASAL | 1 refills | Status: DC

## 2021-03-29 NOTE — Assessment & Plan Note (Signed)
Pt was tx for OM with cefdinir and that is clinically improved but c/o residual cough/hoarsess and congestion (2 negative covid tests) Discussed symptom control mucinex DM, saline, flonase may help  Handout given-sinus rinse Will watch for wheeze or sob/ ER precautions discussed Update if not starting to improve in a week or if worsening

## 2021-03-29 NOTE — Progress Notes (Signed)
Virtual Visit via Video Note  I connected with Gail [name] on 03/29/21 at  2:30 PM EST by a video enabled telemedicine application and verified that I am speaking with the correct person using two identifiers.  Location: Patient: home Provider: office    I discussed the limitations of evaluation and management by telemedicine and the availability of in person appointments. The patient expressed understanding and agreed to proceed.  Parties involved in encounter  Patient: Gail [name]  Provider:  Loura Pardon MD   History of Present Illness: Pt presents with c/o head and chest congestion     Neg covid tests at home  Symptoms started on 1/5 and went to UC on 1/6 Dx with OM bilat and sinusitis Taking cefdinir  Ears are improved but congestion is worse  Eyes are very watery  Cough is nasty/but not severe  Had laryngitis , that is improved now  Head congestion -mostly clear/was green Chest congestion - green  No wheeze at all  No sob   No fever  No aches or chills   Otc: nothing   No nasal sprays   Patient Active Problem List   Diagnosis Date Noted   Viral URI with cough 03/29/2021   Vitamin B12 deficiency 12/21/2020   Current use of proton pump inhibitor 12/12/2020   Urinary frequency 10/18/2020   Hypertension 08/24/2020   Headache 08/24/2020   Epigastric pain 08/26/2019   Screening mammogram, encounter for 10/22/2018   PCOS (polycystic ovarian syndrome) 09/26/2017   Status post total abdominal hysterectomy 12/19/2016   Insomnia 07/22/2016   Auditory processing disorder 04/04/2016   Encounter for routine gynecological examination 11/27/2014   Morbid obesity ([place]) 12/13/2012   Controlled type 2 diabetes mellitus without complication, without long-term current use of insulin ([place]) 12/13/2012   Routine general medical examination at a health care facility 12/04/2011   HYPERTRIGLYCERIDEMIA 04/07/2008   PROBLEMS WITH HEARING 04/07/2008   Anxiety disorder  02/07/2008   ALLERGIC RHINITIS 02/07/2008   HYPOGLYCEMIA, REACTIVE 11/15/2006   Past Medical History:  Diagnosis Date   Anemia    Anxiety    Depression    Diabetes mellitus without complication ([place])    type 2   Dyspnea    Hearing loss    wears hearing aids bilateral   History of allergic rhinitis    History of anxiety    History of blood transfusion    [facility] 09/2016 2 units transfused   History of depression    History of hyperlipidemia    History of syncope    Seizures ([place])    hx seizures as a child,no seizures since age 63 yrs old   Sleep apnea    uses mouth guard at night   Past Surgical History:  Procedure Laterality Date   [place]   x 2   CYSTOSCOPY N/A 12/19/2016   Procedure: CYSTOSCOPY;  Surgeon: Nunzio Cobbs, MD;  Location: [facility];  Service: Gynecology;  Laterality: N/A;   TOTAL LAPAROSCOPIC HYSTERECTOMY WITH SALPINGECTOMY Bilateral 12/19/2016   Procedure: HYSTERECTOMY TOTAL LAPAROSCOPIC WITH SALPINGECTOMY;  Surgeon: Nunzio Cobbs, MD;  Location: [facility];  Service: Gynecology;  Laterality: Bilateral;   WISDOM TOOTH EXTRACTION     Social History   Tobacco Use   Smoking status: Never   Smokeless tobacco: Never  Vaping Use   Vaping Use: Never used  Substance Use Topics   Alcohol use: Yes    Alcohol/week: 0.0 standard drinks  Comment: occasional  winie   Drug use: No   Family History  Problem Relation Age of Onset   Diabetes Father    Diabetes Paternal Grandfather    Prostate cancer Paternal Grandfather    Diabetes Paternal Grandmother    Allergies  Allergen Reactions   Influenza Vaccine Recombinant     Vomiting    Metformin And Related     Nausea vomiting   Penicillins Hives    Has patient had a PCN reaction causing immediate rash, facial/tongue/throat swelling, SOB or lightheadedness with hypotension: Yes Has patient had a PCN reaction causing severe rash involving mucus membranes or skin necrosis:  No Has patient had a PCN reaction that required hospitalization: No Has patient had a PCN reaction occurring within the last 10 years: No If all of the above answers are "NO", then may proceed with Cephalosporin use.    Semaglutide     Took REBYLSUS: Other reaction(s): diarrhea   Current Outpatient Medications on File Prior to Visit  Medication Sig Dispense Refill   atorvastatin (LIPITOR) 20 MG tablet TAKE 1 TABLET BY MOUTH EVERYDAY AT BEDTIME 90 tablet 3   cefdinir (OMNICEF) 300 MG capsule Take 300 mg by mouth 2 (two) times daily.     Choline Fenofibrate (FENOFIBRIC ACID) 135 MG CPDR Take 135 mg by mouth at bedtime. 90 capsule 3   escitalopram (LEXAPRO) 10 MG tablet Take 1 tablet (10 mg total) by mouth daily. 90 tablet 3   glucose blood (ONETOUCH VERIO) test strip USE TO CHECK BLOOD SUGAR ONCE DAILY AS NEEDED 50 strip 2   metoprolol succinate (TOPROL-XL) 50 MG 24 hr tablet Take 1 tablet (50 mg total) by mouth daily. Take with or immediately following a meal. 90 tablet 3   MOUNJARO 5 MG/0.5ML Pen Inject into the skin.     pantoprazole (PROTONIX) 40 MG tablet Take 1 tablet by mouth daily.     No current facility-administered medications on file prior to visit.     Review of Systems  Constitutional:  Positive for malaise/fatigue. Negative for chills and fever.  HENT:  Positive for congestion and sore throat. Negative for ear pain and sinus pain.   Eyes:  Negative for blurred vision, discharge and redness.  Respiratory:  Positive for cough and sputum production. Negative for shortness of breath, wheezing and stridor.   Cardiovascular:  Negative for chest pain, palpitations and leg swelling.  Gastrointestinal:  Negative for abdominal pain, diarrhea, nausea and vomiting.  Musculoskeletal:  Negative for myalgias.  Skin:  Negative for rash.  Neurological:  Negative for dizziness and headaches.   Observations/Objective: Patient appears well, in no distress Weight is baseline  No facial  swelling or asymmetry Mildly hoarse voice, also sounds nasally congested  No obvious tremor or mobility impairment Moving neck and UEs normally Able to hear the call well  No cough or shortness of breath during interview (does clear her throat) Talkative and mentally sharp with no cognitive changes No skin changes on face or neck , no rash or pallor Affect is normal    Assessment and Plan: Problem List Items Addressed This Visit       Respiratory   Viral URI with cough    Pt was tx for OM with cefdinir and that is clinically improved but c/o residual cough/hoarsess and congestion (2 negative covid tests) Discussed symptom control mucinex DM, saline, flonase may help  Handout given-sinus rinse Will watch for wheeze or sob/ ER precautions discussed Update if not starting to  improve in a week or if worsening        Relevant Medications   cefdinir (OMNICEF) 300 MG capsule     Follow Up Instructions: Drink fluids and rest  Finish your antibiotic   Try mucinex DM or robitussin DM for cough and congestion  Nasal saline spray for nasal congestion  Generic flonase for nasal congestion   Watch for wheezing or shortness of breath (if severe go to the ER)   Rest your voice if you can   Update if not starting to improve in a week or if worsening     I discussed the assessment and treatment plan with the patient. The patient was provided an opportunity to ask questions and all were answered. The patient agreed with the plan and demonstrated an understanding of the instructions.   The patient was advised to call back or seek an in-person evaluation if the symptoms worsen or if the condition fails to improve as anticipated.     Loura Pardon, MD

## 2021-03-29 NOTE — Patient Instructions (Signed)
Drink fluids and rest  Finish your antibiotic   Try mucinex DM or robitussin DM for cough and congestion  Nasal saline spray for nasal congestion  Generic flonase for nasal congestion   Watch for wheezing or shortness of breath (if severe go to the ER)   Rest your voice if you can   Update if not starting to improve in a week or if worsening

## 2021-03-30 DIAGNOSIS — E119 Type 2 diabetes mellitus without complications: Secondary | ICD-10-CM | POA: Diagnosis not present

## 2021-03-30 DIAGNOSIS — K760 Fatty (change of) liver, not elsewhere classified: Secondary | ICD-10-CM | POA: Diagnosis not present

## 2021-07-19 DIAGNOSIS — E538 Deficiency of other specified B group vitamins: Secondary | ICD-10-CM | POA: Diagnosis not present

## 2021-07-19 DIAGNOSIS — E785 Hyperlipidemia, unspecified: Secondary | ICD-10-CM | POA: Diagnosis not present

## 2021-07-19 DIAGNOSIS — E119 Type 2 diabetes mellitus without complications: Secondary | ICD-10-CM | POA: Diagnosis not present

## 2021-08-11 DIAGNOSIS — E119 Type 2 diabetes mellitus without complications: Secondary | ICD-10-CM | POA: Diagnosis not present

## 2021-08-30 NOTE — Progress Notes (Deleted)
NEUROLOGY FOLLOW UP OFFICE NOTE  Gail [name] [id number]  Assessment/Plan:   1  Probable migraine without aura, without status migrainosus, not intractable  2  Hypertension   MRI brain with and without contrast and MRA head Sed rate and CRP Headache prevention:  Start topiramate '25mg'$  at bedtime.  We can increase to '50mg'$  at bedtime in 4 weeks if needed. Limit use of pain relievers (Tylenol, ibuprofen) to no more than 2 days out of week to prevent risk of rebound or medication-overuse headache. Keep headache diary Follow up 6 months.     Subjective:  Gail [name] is a 51 year old left-handed female with HTN, DM II, GAD, depression, sleep apnea and history of childhood seizures who follows up for headache.  UPDATE: Sed rate and CRP on 01/26/2021 were 8 and <1.0 respectively  MRI of brain with and without contrast and MRA of head on 02/17/2021 personally reviewed were unremarkable.    Started topiramate in November.   Intensity:  *** Duration:  *** Frequency:  *** Frequency of abortive medication: *** Current NSAIDS/analgesics:  ibuprofen Current triptans:  none Current ergotamine:  none Current anti-emetic:  none Current muscle relaxants:  none Current Antihypertensive medications:  metoprolol succinate '50mg'$  Current Antidepressant medications:  escitalopram '10mg'$  daily Current Anticonvulsant medications:  topiramate '25mg'$  at bedtime Current anti-CGRP:  none Current Vitamins/Herbal/Supplements:  none Current Antihistamines/Decongestants:  none Other therapy:  none Hormone/birth control:  none    Caffeine:  1 can of Coke daily.  No coffee Diet:  6 bottles of water daily.  Does not skip meals Exercise:  walk Depression:  controlled; Anxiety:  a little Other pain:  no Sleep:  OK  HISTORY:  Onset:  June 2022.  She was evaluated in the ED June.  CT head personally reviewed was normal.  She was found to have elevated blood pressure and started on medicaton.   Location:   left-sided Quality:  pulsating progressing to pressure Intensity:  3/10 Aura:  absent Prodrome:  absent Associated symptoms:  Nausea, phonophobia, blurred vision, left ptosis.  She denies associated vomiting, photophobia, other Prautonomic symptoms, unilateral numbness or weakness. Duration:  5-6 hours  Frequency:  2 days a week Frequency of abortive medication: ibuprofen 2 days a week Triggers:  Fatigue Relieving factors:  rest Activity:  movement aggravates   She has had an eye exam that was unremarkable. Reports prior history of headache, very infrequent (once a year)    Past NSAIDS/analgesics:  Tylenol Past abortive triptans:  none Past abortive ergotamine:  none Past muscle relaxants:  none Past anti-emetic:  none Past antihypertensive medications:  HCTZ Past antidepressant medications:  none Past anticonvulsant medications:  none Past anti-CGRP:  none Past vitamins/Herbal/Supplements:  none Past antihistamines/decongestants:  none Other past therapies:  none    Family history of headache:  mother had headaches secondary to an AVM; sister (migraines)  PAST MEDICAL HISTORY: Past Medical History:  Diagnosis Date   Anemia    Anxiety    Depression    Diabetes mellitus without complication (HCC)    type 2   Dyspnea    Hearing loss    wears hearing aids bilateral   History of allergic rhinitis    History of anxiety    History of blood transfusion    [facility] 09/2016 2 units transfused   History of depression    History of hyperlipidemia    History of syncope    Seizures ([facility])    hx seizures as a  child,no seizures since age 22 yrs old   Sleep apnea    uses mouth guard at night    MEDICATIONS: Current Outpatient Medications on File Prior to Visit  Medication Sig Dispense Refill   atorvastatin (LIPITOR) 20 MG tablet TAKE 1 TABLET BY MOUTH EVERYDAY AT BEDTIME 90 tablet 3   cefdinir (OMNICEF) 300 MG capsule Take 300 mg by mouth 2 (two) times daily.     Choline  Fenofibrate (FENOFIBRIC ACID) 135 MG CPDR Take 135 mg by mouth at bedtime. 90 capsule 3   escitalopram (LEXAPRO) 10 MG tablet Take 1 tablet (10 mg total) by mouth daily. 90 tablet 3   fluticasone (FLONASE) 50 MCG/ACT nasal spray Place 2 sprays into both nostrils daily. 16 g 1   glucose blood (ONETOUCH VERIO) test strip USE TO CHECK BLOOD SUGAR ONCE DAILY AS NEEDED 50 strip 2   metoprolol succinate (TOPROL-XL) 50 MG 24 hr tablet Take 1 tablet (50 mg total) by mouth daily. Take with or immediately following a meal. 90 tablet 3   MOUNJARO 5 MG/0.5ML Pen Inject into the skin.     pantoprazole (PROTONIX) 40 MG tablet Take 1 tablet by mouth daily.     No current facility-administered medications on file prior to visit.    ALLERGIES: Allergies  Allergen Reactions   Influenza Vaccine Recombinant     Vomiting    Metformin And Related     Nausea vomiting   Penicillins Hives    Has patient had a PCN reaction causing immediate rash, facial/tongue/throat swelling, SOB or lightheadedness with hypotension: Yes Has patient had a PCN reaction causing severe rash involving mucus membranes or skin necrosis: No Has patient had a PCN reaction that required hospitalization: No Has patient had a PCN reaction occurring within the last 10 years: No If all of the above answers are "NO", then may proceed with Cephalosporin use.    Semaglutide     Took REBYLSUS: Other reaction(s): diarrhea    FAMILY HISTORY: Family History  Problem Relation Age of Onset   Diabetes Father    Diabetes Paternal Grandfather    Prostate cancer Paternal Grandfather    Diabetes Paternal Grandmother       Objective:  *** General: No acute distress.  Patient appears ***-groomed.   Head:  Normocephalic/atraumatic Eyes:  Fundi examined but not visualized Neck: supple, no paraspinal tenderness, full range of motion Heart:  Regular rate and rhythm Lungs:  Clear to auscultation bilaterally Back: No paraspinal  tenderness Neurological Exam: alert and oriented to person, place, and time.  Speech fluent and not dysarthric, language intact.  CN II-XII intact. Bulk and tone normal, muscle strength 5/5 throughout.  Sensation to light touch intact.  Deep tendon reflexes 2+ throughout, toes downgoing.  Finger to nose testing intact.  Gait normal, Romberg negative.   Metta Clines, DO  CC: ***

## 2021-08-31 ENCOUNTER — Ambulatory Visit: Payer: BC Managed Care – PPO | Admitting: Neurology

## 2021-08-31 ENCOUNTER — Encounter: Payer: Self-pay | Admitting: Neurology

## 2021-08-31 DIAGNOSIS — I10 Essential (primary) hypertension: Secondary | ICD-10-CM | POA: Diagnosis not present

## 2021-08-31 DIAGNOSIS — R051 Acute cough: Secondary | ICD-10-CM | POA: Diagnosis not present

## 2021-08-31 DIAGNOSIS — U071 COVID-19: Secondary | ICD-10-CM | POA: Diagnosis not present

## 2021-09-02 ENCOUNTER — Other Ambulatory Visit (HOSPITAL_BASED_OUTPATIENT_CLINIC_OR_DEPARTMENT_OTHER): Payer: Self-pay

## 2021-09-02 MED ORDER — MOUNJARO 7.5 MG/0.5ML ~~LOC~~ SOAJ
SUBCUTANEOUS | 2 refills | Status: DC
  Filled 2021-09-02: qty 2, 28d supply, fill #0

## 2021-09-05 ENCOUNTER — Other Ambulatory Visit (HOSPITAL_BASED_OUTPATIENT_CLINIC_OR_DEPARTMENT_OTHER): Payer: Self-pay

## 2021-09-16 DIAGNOSIS — K219 Gastro-esophageal reflux disease without esophagitis: Secondary | ICD-10-CM | POA: Diagnosis not present

## 2021-10-04 ENCOUNTER — Other Ambulatory Visit (HOSPITAL_BASED_OUTPATIENT_CLINIC_OR_DEPARTMENT_OTHER): Payer: Self-pay

## 2021-10-04 MED ORDER — MOUNJARO 10 MG/0.5ML ~~LOC~~ SOAJ
SUBCUTANEOUS | 6 refills | Status: DC
  Filled 2021-10-04: qty 2, 28d supply, fill #0
  Filled 2021-11-01: qty 2, 28d supply, fill #1
  Filled 2021-12-06: qty 2, 28d supply, fill #2
  Filled 2022-01-03: qty 2, 28d supply, fill #3
  Filled 2022-02-03: qty 2, 28d supply, fill #4

## 2021-10-11 ENCOUNTER — Other Ambulatory Visit: Payer: Self-pay | Admitting: Family Medicine

## 2021-10-11 DIAGNOSIS — Z1231 Encounter for screening mammogram for malignant neoplasm of breast: Secondary | ICD-10-CM

## 2021-10-12 ENCOUNTER — Ambulatory Visit
Admission: RE | Admit: 2021-10-12 | Discharge: 2021-10-12 | Disposition: A | Discharge status: Home / Self Care | Payer: BC Managed Care – PPO | Source: Ambulatory Visit | Attending: Family Medicine | Admitting: Family Medicine

## 2021-10-12 DIAGNOSIS — Z1231 Encounter for screening mammogram for malignant neoplasm of breast: Secondary | ICD-10-CM | POA: Diagnosis not present

## 2021-10-20 DIAGNOSIS — K134 Granuloma and granuloma-like lesions of oral mucosa: Secondary | ICD-10-CM | POA: Diagnosis not present

## 2021-11-01 ENCOUNTER — Other Ambulatory Visit (HOSPITAL_BASED_OUTPATIENT_CLINIC_OR_DEPARTMENT_OTHER): Payer: Self-pay

## 2021-11-23 DIAGNOSIS — R030 Elevated blood-pressure reading, without diagnosis of hypertension: Secondary | ICD-10-CM | POA: Diagnosis not present

## 2021-11-23 DIAGNOSIS — E119 Type 2 diabetes mellitus without complications: Secondary | ICD-10-CM | POA: Diagnosis not present

## 2021-12-06 ENCOUNTER — Other Ambulatory Visit (HOSPITAL_BASED_OUTPATIENT_CLINIC_OR_DEPARTMENT_OTHER): Payer: Self-pay

## 2021-12-07 ENCOUNTER — Other Ambulatory Visit (HOSPITAL_BASED_OUTPATIENT_CLINIC_OR_DEPARTMENT_OTHER): Payer: Self-pay

## 2021-12-10 DIAGNOSIS — Z20822 Contact with and (suspected) exposure to covid-19: Secondary | ICD-10-CM | POA: Diagnosis not present

## 2021-12-10 DIAGNOSIS — J069 Acute upper respiratory infection, unspecified: Secondary | ICD-10-CM | POA: Diagnosis not present

## 2021-12-19 ENCOUNTER — Other Ambulatory Visit: Payer: Self-pay | Admitting: Family Medicine

## 2021-12-19 DIAGNOSIS — E781 Pure hyperglyceridemia: Secondary | ICD-10-CM

## 2021-12-21 ENCOUNTER — Encounter: Payer: BC Managed Care – PPO | Admitting: Family Medicine

## 2021-12-28 ENCOUNTER — Encounter: Payer: BC Managed Care – PPO | Admitting: Family Medicine

## 2021-12-31 ENCOUNTER — Other Ambulatory Visit: Payer: Self-pay | Admitting: Family Medicine

## 2021-12-31 DIAGNOSIS — I10 Essential (primary) hypertension: Secondary | ICD-10-CM

## 2022-01-03 ENCOUNTER — Other Ambulatory Visit (HOSPITAL_BASED_OUTPATIENT_CLINIC_OR_DEPARTMENT_OTHER): Payer: Self-pay

## 2022-01-05 ENCOUNTER — Telehealth: Payer: Self-pay | Admitting: Family Medicine

## 2022-01-05 NOTE — Telephone Encounter (Signed)
Dexacom not on med list and CPE was over a year ago, please advise

## 2022-01-05 NOTE — Telephone Encounter (Signed)
That would need to come from her endocrinologist

## 2022-01-05 NOTE — Telephone Encounter (Signed)
Patient needs a rx for a new Dexacom monitor to check blood sugar. She lost her previous one.

## 2022-01-06 NOTE — Telephone Encounter (Signed)
Left VM letting pt know Dr. Tower's comments  

## 2022-01-11 ENCOUNTER — Encounter: Payer: BC Managed Care – PPO | Admitting: Family Medicine

## 2022-01-26 ENCOUNTER — Telehealth: Payer: Self-pay | Admitting: Family Medicine

## 2022-01-26 DIAGNOSIS — E538 Deficiency of other specified B group vitamins: Secondary | ICD-10-CM

## 2022-01-26 DIAGNOSIS — E119 Type 2 diabetes mellitus without complications: Secondary | ICD-10-CM

## 2022-01-26 DIAGNOSIS — I10 Essential (primary) hypertension: Secondary | ICD-10-CM

## 2022-01-26 DIAGNOSIS — Z0000 Encounter for general adult medical examination without abnormal findings: Secondary | ICD-10-CM

## 2022-01-26 DIAGNOSIS — Z79899 Other long term (current) drug therapy: Secondary | ICD-10-CM

## 2022-01-26 DIAGNOSIS — E781 Pure hyperglyceridemia: Secondary | ICD-10-CM

## 2022-01-26 NOTE — Telephone Encounter (Signed)
Will route to front desk pool to get lab appt scheduled (please schedule fasting lab appt before CPE)  Will route to PCP to put lab orders in.

## 2022-01-26 NOTE — Telephone Encounter (Signed)
Patient called and stated can she get labs done because her CPE is on 01/30/2022. Call back number is [phone]

## 2022-01-27 ENCOUNTER — Other Ambulatory Visit (INDEPENDENT_AMBULATORY_CARE_PROVIDER_SITE_OTHER): Payer: BC Managed Care – PPO

## 2022-01-27 DIAGNOSIS — E781 Pure hyperglyceridemia: Secondary | ICD-10-CM | POA: Diagnosis not present

## 2022-01-27 DIAGNOSIS — I10 Essential (primary) hypertension: Secondary | ICD-10-CM

## 2022-01-27 DIAGNOSIS — Z79899 Other long term (current) drug therapy: Secondary | ICD-10-CM | POA: Diagnosis not present

## 2022-01-27 DIAGNOSIS — E538 Deficiency of other specified B group vitamins: Secondary | ICD-10-CM

## 2022-01-27 LAB — LIPID PANEL
Cholesterol: 110 mg/dL (ref 0–200)
HDL: 41.3 mg/dL (ref >39.00)
LDL Cholesterol: 54 mg/dL (ref 0–99)
NonHDL: 68.29
Total CHOL/HDL Ratio: 3
Triglycerides: 69 mg/dL (ref 0.0–149.0)
VLDL: 13.8 mg/dL (ref 0.0–40.0)

## 2022-01-27 LAB — CBC WITH DIFFERENTIAL/PLATELET
Basophils Absolute: 0 10*3/uL (ref 0.0–0.1)
Basophils Relative: 0.4 % (ref 0.0–3.0)
Eosinophils Absolute: 0.1 10*3/uL (ref 0.0–0.7)
Eosinophils Relative: 0.8 % (ref 0.0–5.0)
HCT: 41.1 % (ref 36.0–46.0)
Hemoglobin: 13.5 g/dL (ref 12.0–15.0)
Lymphocytes Relative: 14.6 % (ref 12.0–46.0)
Lymphs Abs: 0.9 10*3/uL (ref 0.7–4.0)
MCHC: 32.9 g/dL (ref 30.0–36.0)
MCV: 86.9 fl (ref 78.0–100.0)
Monocytes Absolute: 0.3 10*3/uL (ref 0.1–1.0)
Monocytes Relative: 5.2 % (ref 3.0–12.0)
Neutro Abs: 4.9 10*3/uL (ref 1.4–7.7)
Neutrophils Relative %: 79 % — ABNORMAL HIGH (ref 43.0–77.0)
Platelets: 215 10*3/uL (ref 150.0–400.0)
RBC: 4.73 Mil/uL (ref 3.87–5.11)
RDW: 13.9 % (ref 11.5–15.5)
WBC: 6.2 10*3/uL (ref 4.0–10.5)

## 2022-01-27 LAB — COMPREHENSIVE METABOLIC PANEL
ALT: 24 U/L (ref 0–35)
AST: 23 U/L (ref 0–37)
Albumin: 4.3 g/dL (ref 3.5–5.2)
Alkaline Phosphatase: 38 U/L — ABNORMAL LOW (ref 39–117)
BUN: 11 mg/dL (ref 6–23)
CO2: 29 mEq/L (ref 19–32)
Calcium: 8.8 mg/dL (ref 8.4–10.5)
Chloride: 103 mEq/L (ref 96–112)
Creatinine, Ser: 0.85 mg/dL (ref 0.40–1.20)
GFR: 80.01 mL/min (ref >60.00)
Glucose, Bld: 111 mg/dL — ABNORMAL HIGH (ref 70–99)
Potassium: 4.6 mEq/L (ref 3.5–5.1)
Sodium: 138 mEq/L (ref 135–145)
Total Bilirubin: 0.6 mg/dL (ref 0.2–1.2)
Total Protein: 6.5 g/dL (ref 6.0–8.3)

## 2022-01-27 LAB — VITAMIN D 25 HYDROXY (VIT D DEFICIENCY, FRACTURES): VITD: 21.53 ng/mL — ABNORMAL LOW (ref 30.00–100.00)

## 2022-01-27 LAB — VITAMIN B12: Vitamin B-12: 365 pg/mL (ref 211–911)

## 2022-01-27 LAB — TSH: TSH: 0.88 u[IU]/mL (ref 0.35–5.50)

## 2022-01-29 ENCOUNTER — Other Ambulatory Visit: Payer: Self-pay | Admitting: Family Medicine

## 2022-01-29 DIAGNOSIS — I10 Essential (primary) hypertension: Secondary | ICD-10-CM

## 2022-01-30 ENCOUNTER — Encounter: Payer: Self-pay | Admitting: Family Medicine

## 2022-01-30 ENCOUNTER — Ambulatory Visit (INDEPENDENT_AMBULATORY_CARE_PROVIDER_SITE_OTHER): Payer: BC Managed Care – PPO | Admitting: Family Medicine

## 2022-01-30 VITALS — BP 122/62 | HR 91 | Temp 97.8°F | Wt 238.4 lb

## 2022-01-30 DIAGNOSIS — E119 Type 2 diabetes mellitus without complications: Secondary | ICD-10-CM | POA: Diagnosis not present

## 2022-01-30 DIAGNOSIS — Z23 Encounter for immunization: Secondary | ICD-10-CM | POA: Diagnosis not present

## 2022-01-30 DIAGNOSIS — I10 Essential (primary) hypertension: Secondary | ICD-10-CM

## 2022-01-30 DIAGNOSIS — E538 Deficiency of other specified B group vitamins: Secondary | ICD-10-CM

## 2022-01-30 DIAGNOSIS — Z0000 Encounter for general adult medical examination without abnormal findings: Secondary | ICD-10-CM

## 2022-01-30 DIAGNOSIS — F411 Generalized anxiety disorder: Secondary | ICD-10-CM

## 2022-01-30 DIAGNOSIS — Z79899 Other long term (current) drug therapy: Secondary | ICD-10-CM | POA: Diagnosis not present

## 2022-01-30 DIAGNOSIS — E781 Pure hyperglyceridemia: Secondary | ICD-10-CM

## 2022-01-30 MED ORDER — ESCITALOPRAM OXALATE 10 MG PO TABS: 10.0000 mg | ORAL_TABLET | Freq: Every day | ORAL | 3 refills | Status: DC

## 2022-01-30 MED ORDER — ATORVASTATIN CALCIUM 20 MG PO TABS: ORAL_TABLET | ORAL | 3 refills | Status: DC

## 2022-01-30 MED ORDER — METOPROLOL SUCCINATE ER 50 MG PO TB24: 50.0000 mg | ORAL_TABLET | Freq: Every day | ORAL | 3 refills | Status: DC

## 2022-01-30 MED ORDER — FENOFIBRIC ACID 135 MG PO CPDR: 1.0000 | DELAYED_RELEASE_CAPSULE | Freq: Every day | ORAL | 3 refills | Status: DC

## 2022-01-30 NOTE — Assessment & Plan Note (Signed)
Disc goals for lipids and reasons to control them Rev last labs with pt Rev low sat fat diet in detail Well controlled with atorvastatin 20 mg daily  Fenofibrate 135 mg daily  Better diet/commended

## 2022-01-30 NOTE — Assessment & Plan Note (Signed)
Doing better under care of endo on mounjaro 10 mg  Wt is down and A1c per pt was last 6.2  Enc her to eat more protein  Low glycemic diet and exercise   On statin   Care from Dr Forde Dandy -endocrinology

## 2022-01-30 NOTE — Assessment & Plan Note (Signed)
Lab Results  Component Value Date   VITAMINB12 365 01/27/2022   This is improved  Oral repl prn in setting of ppi

## 2022-01-30 NOTE — Assessment & Plan Note (Signed)
Discussed how this problem influences overall health and the risks it imposes  Reviewed plan for weight loss with lower calorie diet (via better food choices and also portion control or program like weight watchers) and exercise building up to or more than 30 minutes 5 days per week including some aerobic activity  Wt loss commended  Taking mounjaro 10 mg weekly (did not tolerate inc to 12)  Under care of endocrine

## 2022-01-30 NOTE — Assessment & Plan Note (Signed)
Continues to do well with lexapro 10 mg daily  Reviewed stressors/ coping techniques/symptoms/ support sources/ tx options and side effects in detail today Will continue

## 2022-01-30 NOTE — Patient Instructions (Addendum)
Stop up front to sign a release to get your endoscopy report   I do recommend the the shingrix vaccine  If you are interested in the shingles vaccine series (Shingrix), call your insurance or pharmacy to check on coverage and location it must be given.  If affordable - you can schedule it here or at your pharmacy depending on coverage   Tetanus vaccine Td today   Add some strength training to your walking  It helps brain and body   Get back on your vitamin D - at least 2000 iu day   Get protein with every meal

## 2022-01-30 NOTE — Assessment & Plan Note (Signed)
Reviewed health habits including diet and exercise and skin cancer prevention Reviewed appropriate screening tests for age  Also reviewed health mt list, fam hx and immunization status , as well as social and family history   See HPI Labs reviewed  Commended wt loss  No flu shot due to past rxn Interested in shigrix if covered Td updated today  Colonoscopy was per pt 2 y ago-sent to [place] for that report Mammog utd 09/2021 No pap in setting of hysterectomy  Enc her to add more strength training for exercise

## 2022-01-30 NOTE — Assessment & Plan Note (Signed)
bp in fair control at this time  BP Readings from Last 1 Encounters:  01/30/22 122/62   No changes needed Most recent labs reviewed  Disc lifstyle change with low sodium diet and exercise  Plan to continue metoprolol xl 50 mg daily

## 2022-01-30 NOTE — Assessment & Plan Note (Signed)
B12 in nl range D is low-plans to re start this

## 2022-01-30 NOTE — Progress Notes (Signed)
Subjective:    Patient ID: Gail [name], female    DOB: [birth year], 51 y.o.   MRN: [id number]  HPI Here for health maintenance exam and to review chronic medical problems    Wt Readings from Last 3 Encounters:  01/30/22 238 lb 6.4 oz (108.1 kg)  01/26/21 257 lb (116.6 kg)  12/20/20 259 lb (117.5 kg)   39.67 kg/m  Wt is now 238.4  Improved!  Daugher is in [place] for a semester  She got to go      Immunization History  Administered Date(s) Administered   PFIZER(Purple Top)SARS-COV-2 Vaccination 07/01/2019, 08/01/2019   Tdap 12/12/2011   Health Maintenance Due  Topic Date Due   OPHTHALMOLOGY EXAM  Never done   Diabetic kidney evaluation - Urine ACR  Never done   Zoster Vaccines- Shingrix (1 of 2) Never done   COLONOSCOPY (Pts 45-44yr Insurance coverage will need to be confirmed)  Never done   COVID-19 Vaccine (3 - Pfizer risk series) 08/29/2019   HEMOGLOBIN A1C  04/25/2020   TETANUS/TDAP  12/11/2021   FOOT EXAM  12/20/2021    No flu vaccine- she had a reaction in the past   Colonoscopy /colon cancer screening  Per pt 2 y ago  Maybe 5 y recall   Shingrix  : had shingles once Interested    Tetanus vaccine 11/2011 Tdap  Will get it today  Td - will do  Not around any young babies    Mammogram 09/2021  Self breast exam : no lumps or changes   No pap /hysterectomy  Does not see gyn  No problems at all   Mood Takes lexapro 10 mg daily  Doing well with this   Taking care of herself   Exercise - walking    HTN bp is stable today  No cp or palpitations or headaches or edema  No side effects to medicines  BP Readings from Last 3 Encounters:  01/30/22 122/62  01/26/21 120/84  12/20/20 114/76   Pulse Readings from Last 3 Encounters:  01/30/22 91  01/26/21 78  12/20/20 90    Metoprolol xl 50 mg daily   GERD Protonix 40 mg daily  Lab Results  Component Value Date   VITAMINB12 365 01/27/2022   D level is 21.5  Was off her D      DM2 6.2 was last A1c   Sees endocrinologist  Some hypoglycemia when traveling- ate differently   May need to eat more protein   Mounjaro 10 mg weekly - was going up to 12 but did not tolerate   Hyperlipidemia Lab Results  Component Value Date   CHOL 110 01/27/2022   CHOL 111 12/20/2020   CHOL 138 10/24/2019   Lab Results  Component Value Date   HDL 41.30 01/27/2022   HDL 39.80 12/20/2020   HDL 45.30 10/24/2019   Lab Results  Component Value Date   LDLCALC 54 01/27/2022   LDLCALC 51 12/20/2020   LDLCALC 73 10/24/2019   Lab Results  Component Value Date   TRIG 69.0 01/27/2022   TRIG 100.0 12/20/2020   TRIG 98.0 10/24/2019   Lab Results  Component Value Date   CHOLHDL 3 01/27/2022   CHOLHDL 3 12/20/2020   CHOLHDL 3 10/24/2019   Lab Results  Component Value Date   LDLDIRECT 109.0 10/15/2018   LDLDIRECT 106.4 12/05/2012   Atorvastatin 20 mg daily  Fenofibrate 135 mg daily   Lab Results  Component Value Date   WBC 6.2  01/27/2022   HGB 13.5 01/27/2022   HCT 41.1 01/27/2022   MCV 86.9 01/27/2022   PLT 215.0 01/27/2022   Lab Results  Component Value Date   CREATININE 0.85 01/27/2022   BUN 11 01/27/2022   NA 138 01/27/2022   K 4.6 01/27/2022   CL 103 01/27/2022   CO2 29 01/27/2022   Lab Results  Component Value Date   TSH 0.88 01/27/2022    Patient Active Problem List   Diagnosis Date Noted   Vitamin B12 deficiency 12/21/2020   Current use of proton pump inhibitor 12/12/2020   Urinary frequency 10/18/2020   Hypertension 08/24/2020   Headache 08/24/2020   Epigastric pain 08/26/2019   Screening mammogram, encounter for 10/22/2018   PCOS (polycystic ovarian syndrome) 09/26/2017   Status post total abdominal hysterectomy 12/19/2016   Insomnia 07/22/2016   Auditory processing disorder 04/04/2016   Encounter for routine gynecological examination 11/27/2014   Morbid obesity ([place]) 12/13/2012   Controlled type 2 diabetes mellitus without  complication, without long-term current use of insulin ([place]) 12/13/2012   Routine general medical examination at a health care facility 12/04/2011   HYPERTRIGLYCERIDEMIA 04/07/2008   PROBLEMS WITH HEARING 04/07/2008   Anxiety disorder 02/07/2008   ALLERGIC RHINITIS 02/07/2008   HYPOGLYCEMIA, REACTIVE 11/15/2006   Past Medical History:  Diagnosis Date   Anemia    Anxiety    Depression    Diabetes mellitus without complication ([place])    type 2   Dyspnea    Hearing loss    wears hearing aids bilateral   History of allergic rhinitis    History of anxiety    History of blood transfusion    [facility] 09/2016 2 units transfused   History of depression    History of hyperlipidemia    History of syncope    Seizures (HCC)    hx seizures as a child,no seizures since age 51 yrs old   Sleep apnea    uses mouth guard at night   Past Surgical History:  Procedure Laterality Date   [place]   x 2   CYSTOSCOPY N/A 12/19/2016   Procedure: CYSTOSCOPY;  Surgeon: Nunzio Cobbs, MD;  Location: [facility];  Service: Gynecology;  Laterality: N/A;   TOTAL LAPAROSCOPIC HYSTERECTOMY WITH SALPINGECTOMY Bilateral 12/19/2016   Procedure: HYSTERECTOMY TOTAL LAPAROSCOPIC WITH SALPINGECTOMY;  Surgeon: Nunzio Cobbs, MD;  Location: [facility];  Service: Gynecology;  Laterality: Bilateral;   WISDOM TOOTH EXTRACTION     Social History   Tobacco Use   Smoking status: Never   Smokeless tobacco: Never  Vaping Use   Vaping Use: Never used  Substance Use Topics   Alcohol use: Yes    Alcohol/week: 0.0 standard drinks of alcohol    Comment: occasional  winie   Drug use: No   Family History  Problem Relation Age of Onset   Diabetes Father    Diabetes Paternal Grandfather    Prostate cancer Paternal Grandfather    Diabetes Paternal Grandmother    Allergies  Allergen Reactions   Influenza Vaccine Recombinant     Vomiting    Metformin And Related     Nausea vomiting    Penicillins Hives    Has patient had a PCN reaction causing immediate rash, facial/tongue/throat swelling, SOB or lightheadedness with hypotension: Yes Has patient had a PCN reaction causing severe rash involving mucus membranes or skin necrosis: No Has patient had a PCN reaction that required hospitalization: No Has  patient had a PCN reaction occurring within the last 10 years: No If all of the above answers are "NO", then may proceed with Cephalosporin use.    Semaglutide     Took REBYLSUS: Other reaction(s): diarrhea   Current Outpatient Medications on File Prior to Visit  Medication Sig Dispense Refill   glucose blood (ONETOUCH VERIO) test strip USE TO CHECK BLOOD SUGAR ONCE DAILY AS NEEDED 50 strip 2   MOUNJARO 5 MG/0.5ML Pen Inject into the skin.     pantoprazole (PROTONIX) 40 MG tablet Take 1 tablet by mouth daily.     tirzepatide (MOUNJARO) 10 MG/0.5ML Pen Inject 10 mg into the skin once weekly. 2 mL 6   fluticasone (FLONASE) 50 MCG/ACT nasal spray Place 2 sprays into both nostrils daily. (Patient not taking: Reported on 01/30/2022) 16 g 1   No current facility-administered medications on file prior to visit.      Review of Systems  Constitutional:  Negative for activity change, appetite change, fatigue, fever and unexpected weight change.  HENT:  Negative for congestion, ear pain, rhinorrhea, sinus pressure and sore throat.   Eyes:  Negative for pain, redness and visual disturbance.  Respiratory:  Negative for cough, shortness of breath and wheezing.   Cardiovascular:  Negative for chest pain and palpitations.  Gastrointestinal:  Negative for abdominal pain, blood in stool, constipation and diarrhea.  Endocrine: Negative for polydipsia and polyuria.  Genitourinary:  Negative for dysuria, frequency and urgency.  Musculoskeletal:  Negative for arthralgias, back pain and myalgias.  Skin:  Negative for pallor and rash.  Allergic/Immunologic: Negative for environmental allergies.   Neurological:  Negative for dizziness, syncope and headaches.  Hematological:  Negative for adenopathy. Does not bruise/bleed easily.  Psychiatric/Behavioral:  Negative for decreased concentration and dysphoric mood. The patient is not nervous/anxious.        Objective:   Physical Exam Constitutional:      General: She is not in acute distress.    Appearance: Normal appearance. She is well-developed. She is obese. She is not ill-appearing or diaphoretic.  HENT:     Head: Normocephalic and atraumatic.     Right Ear: Tympanic membrane, ear canal and external ear normal.     Left Ear: Tympanic membrane, ear canal and external ear normal.     Nose: Nose normal. No congestion.     Mouth/Throat:     Mouth: Mucous membranes are moist.     Pharynx: Oropharynx is clear. No posterior oropharyngeal erythema.  Eyes:     General: No scleral icterus.    Extraocular Movements: Extraocular movements intact.     Conjunctiva/sclera: Conjunctivae normal.     Pupils: Pupils are equal, round, and reactive to light.  Neck:     Thyroid: No thyromegaly.     Vascular: No carotid bruit or JVD.  Cardiovascular:     Rate and Rhythm: Normal rate and regular rhythm.     Pulses: Normal pulses.     Heart sounds: Normal heart sounds.     No gallop.  Pulmonary:     Effort: Pulmonary effort is normal. No respiratory distress.     Breath sounds: Normal breath sounds. No wheezing.     Comments: Good air exch Chest:     Chest wall: No tenderness.  Abdominal:     General: Bowel sounds are normal. There is no distension or abdominal bruit.     Palpations: Abdomen is soft. There is no mass.     Tenderness: There is no  abdominal tenderness.     Hernia: No hernia is present.  Genitourinary:    Comments: Breast exam: No mass, nodules, thickening, tenderness, bulging, retraction, inflamation, nipple discharge or skin changes noted.  No axillary or clavicular LA.     Musculoskeletal:        General: No tenderness.  Normal range of motion.     Cervical back: Normal range of motion and neck supple. No rigidity. No muscular tenderness.     Right lower leg: No edema.     Left lower leg: No edema.     Comments: No kyphosis   Lymphadenopathy:     Cervical: No cervical adenopathy.  Skin:    General: Skin is warm and dry.     Coloration: Skin is not pale.     Findings: No erythema or rash.     Comments: Mildly tanned Solar lentigines diffusely   Neurological:     Mental Status: She is alert. Mental status is at baseline.     Cranial Nerves: No cranial nerve deficit.     Motor: No abnormal muscle tone.     Coordination: Coordination normal.     Gait: Gait normal.     Deep Tendon Reflexes: Reflexes are normal and symmetric. Reflexes normal.  Psychiatric:        Mood and Affect: Mood normal.        Cognition and Memory: Cognition and memory normal.           Assessment & Plan:   Problem List Items Addressed This Visit       Cardiovascular and Mediastinum   Hypertension    bp in fair control at this time  BP Readings from Last 1 Encounters:  01/30/22 122/62  No changes needed Most recent labs reviewed  Disc lifstyle change with low sodium diet and exercise  Plan to continue metoprolol xl 50 mg daily        Relevant Medications   atorvastatin (LIPITOR) 20 MG tablet   Choline Fenofibrate (FENOFIBRIC ACID) 135 MG CPDR   metoprolol succinate (TOPROL-XL) 50 MG 24 hr tablet     Endocrine   Controlled type 2 diabetes mellitus without complication, without long-term current use of insulin (HCC)    Doing better under care of endo on mounjaro 10 mg  Wt is down and A1c per pt was last 6.2  Enc her to eat more protein  Low glycemic diet and exercise   On statin   Care from Dr Forde Dandy -endocrinology       Relevant Medications   atorvastatin (LIPITOR) 20 MG tablet     Other   Anxiety disorder    Continues to do well with lexapro 10 mg daily  Reviewed stressors/ coping  techniques/symptoms/ support sources/ tx options and side effects in detail today Will continue       Relevant Medications   escitalopram (LEXAPRO) 10 MG tablet   Current use of proton pump inhibitor    B12 in nl range D is low-plans to re start this       HYPERTRIGLYCERIDEMIA    Disc goals for lipids and reasons to control them Rev last labs with pt Rev low sat fat diet in detail Well controlled with atorvastatin 20 mg daily  Fenofibrate 135 mg daily  Better diet/commended       Relevant Medications   atorvastatin (LIPITOR) 20 MG tablet   Choline Fenofibrate (FENOFIBRIC ACID) 135 MG CPDR   metoprolol succinate (TOPROL-XL) 50 MG 24 hr tablet  Morbid obesity ([place])    Discussed how this problem influences overall health and the risks it imposes  Reviewed plan for weight loss with lower calorie diet (via better food choices and also portion control or program like weight watchers) and exercise building up to or more than 30 minutes 5 days per week including some aerobic activity  Wt loss commended  Taking mounjaro 10 mg weekly (did not tolerate inc to 12)  Under care of endocrine      Routine general medical examination at a health care facility - Primary    Reviewed health habits including diet and exercise and skin cancer prevention Reviewed appropriate screening tests for age  Also reviewed health mt list, fam hx and immunization status , as well as social and family history   See HPI Labs reviewed  Commended wt loss  No flu shot due to past rxn Interested in shigrix if covered Td updated today  Colonoscopy was per pt 2 y ago-sent to [facility] for that report Mammog utd 09/2021 No pap in setting of hysterectomy  Enc her to add more strength training for exercise       Vitamin B12 deficiency    Lab Results  Component Value Date   VITAMINB12 365 01/27/2022  This is improved  Oral repl prn in setting of ppi      Other Visit Diagnoses     Need for vaccine for Td  (tetanus-diphtheria)       Relevant Orders   Td : Tetanus/diphtheria >7yo Preservative  free (Completed)

## 2022-02-03 ENCOUNTER — Other Ambulatory Visit (HOSPITAL_BASED_OUTPATIENT_CLINIC_OR_DEPARTMENT_OTHER): Payer: Self-pay

## 2022-03-08 ENCOUNTER — Other Ambulatory Visit (HOSPITAL_BASED_OUTPATIENT_CLINIC_OR_DEPARTMENT_OTHER): Payer: Self-pay

## 2022-03-09 ENCOUNTER — Other Ambulatory Visit (HOSPITAL_BASED_OUTPATIENT_CLINIC_OR_DEPARTMENT_OTHER): Payer: Self-pay

## 2022-03-09 ENCOUNTER — Other Ambulatory Visit (HOSPITAL_COMMUNITY): Payer: Self-pay

## 2022-03-10 ENCOUNTER — Other Ambulatory Visit (HOSPITAL_BASED_OUTPATIENT_CLINIC_OR_DEPARTMENT_OTHER): Payer: Self-pay

## 2022-03-10 MED ORDER — MOUNJARO 12.5 MG/0.5ML ~~LOC~~ SOAJ
12.5000 mg | SUBCUTANEOUS | 6 refills | Status: DC
  Filled 2022-03-10 – 2022-03-30 (×6): qty 2, 28d supply, fill #0

## 2022-03-15 ENCOUNTER — Other Ambulatory Visit (HOSPITAL_BASED_OUTPATIENT_CLINIC_OR_DEPARTMENT_OTHER): Payer: Self-pay

## 2022-03-15 ENCOUNTER — Other Ambulatory Visit: Payer: Self-pay

## 2022-03-23 ENCOUNTER — Encounter (HOSPITAL_BASED_OUTPATIENT_CLINIC_OR_DEPARTMENT_OTHER): Payer: Self-pay | Admitting: Pharmacist

## 2022-03-23 ENCOUNTER — Other Ambulatory Visit (HOSPITAL_BASED_OUTPATIENT_CLINIC_OR_DEPARTMENT_OTHER): Payer: Self-pay

## 2022-03-24 ENCOUNTER — Other Ambulatory Visit: Payer: Self-pay

## 2022-03-24 ENCOUNTER — Other Ambulatory Visit (HOSPITAL_BASED_OUTPATIENT_CLINIC_OR_DEPARTMENT_OTHER): Payer: Self-pay

## 2022-03-25 ENCOUNTER — Other Ambulatory Visit (HOSPITAL_BASED_OUTPATIENT_CLINIC_OR_DEPARTMENT_OTHER): Payer: Self-pay

## 2022-03-29 ENCOUNTER — Other Ambulatory Visit (HOSPITAL_BASED_OUTPATIENT_CLINIC_OR_DEPARTMENT_OTHER): Payer: Self-pay

## 2022-03-30 ENCOUNTER — Other Ambulatory Visit (HOSPITAL_BASED_OUTPATIENT_CLINIC_OR_DEPARTMENT_OTHER): Payer: Self-pay

## 2022-03-30 ENCOUNTER — Other Ambulatory Visit: Payer: Self-pay

## 2022-03-30 ENCOUNTER — Other Ambulatory Visit (HOSPITAL_COMMUNITY): Payer: Self-pay

## 2022-03-30 DIAGNOSIS — E119 Type 2 diabetes mellitus without complications: Secondary | ICD-10-CM | POA: Diagnosis not present

## 2022-03-30 MED ORDER — MOUNJARO 12.5 MG/0.5ML ~~LOC~~ SOAJ
12.5000 mg | SUBCUTANEOUS | 0 refills | Status: DC
  Filled 2022-03-30: qty 2, 28d supply, fill #0

## 2022-03-31 ENCOUNTER — Other Ambulatory Visit (HOSPITAL_BASED_OUTPATIENT_CLINIC_OR_DEPARTMENT_OTHER): Payer: Self-pay

## 2022-03-31 MED ORDER — MOUNJARO 12.5 MG/0.5ML ~~LOC~~ SOAJ
12.5000 mg | SUBCUTANEOUS | 6 refills | Status: DC
  Filled 2022-03-31: qty 6, 84d supply, fill #0

## 2022-04-05 ENCOUNTER — Other Ambulatory Visit (HOSPITAL_BASED_OUTPATIENT_CLINIC_OR_DEPARTMENT_OTHER): Payer: Self-pay

## 2022-04-07 ENCOUNTER — Other Ambulatory Visit (HOSPITAL_BASED_OUTPATIENT_CLINIC_OR_DEPARTMENT_OTHER): Payer: Self-pay

## 2022-04-07 ENCOUNTER — Other Ambulatory Visit (HOSPITAL_COMMUNITY): Payer: Self-pay

## 2022-04-13 ENCOUNTER — Other Ambulatory Visit (HOSPITAL_BASED_OUTPATIENT_CLINIC_OR_DEPARTMENT_OTHER): Payer: Self-pay

## 2022-04-15 ENCOUNTER — Other Ambulatory Visit (HOSPITAL_BASED_OUTPATIENT_CLINIC_OR_DEPARTMENT_OTHER): Payer: Self-pay

## 2022-04-30 ENCOUNTER — Other Ambulatory Visit (HOSPITAL_BASED_OUTPATIENT_CLINIC_OR_DEPARTMENT_OTHER): Payer: Self-pay

## 2022-05-11 ENCOUNTER — Other Ambulatory Visit (HOSPITAL_BASED_OUTPATIENT_CLINIC_OR_DEPARTMENT_OTHER): Payer: Self-pay

## 2022-05-15 ENCOUNTER — Other Ambulatory Visit (HOSPITAL_BASED_OUTPATIENT_CLINIC_OR_DEPARTMENT_OTHER): Payer: Self-pay

## 2022-05-24 ENCOUNTER — Other Ambulatory Visit (HOSPITAL_BASED_OUTPATIENT_CLINIC_OR_DEPARTMENT_OTHER): Payer: Self-pay

## 2022-05-26 ENCOUNTER — Other Ambulatory Visit (HOSPITAL_BASED_OUTPATIENT_CLINIC_OR_DEPARTMENT_OTHER): Payer: Self-pay

## 2022-06-25 ENCOUNTER — Other Ambulatory Visit (HOSPITAL_BASED_OUTPATIENT_CLINIC_OR_DEPARTMENT_OTHER): Payer: Self-pay

## 2022-06-26 DIAGNOSIS — J40 Bronchitis, not specified as acute or chronic: Secondary | ICD-10-CM | POA: Diagnosis not present

## 2022-06-26 DIAGNOSIS — R0602 Shortness of breath: Secondary | ICD-10-CM | POA: Diagnosis not present

## 2022-06-26 DIAGNOSIS — R059 Cough, unspecified: Secondary | ICD-10-CM | POA: Diagnosis not present

## 2022-06-27 ENCOUNTER — Ambulatory Visit: Payer: BC Managed Care – PPO | Admitting: Family Medicine

## 2022-09-06 DIAGNOSIS — E119 Type 2 diabetes mellitus without complications: Secondary | ICD-10-CM | POA: Diagnosis not present

## 2022-09-12 ENCOUNTER — Other Ambulatory Visit: Payer: Self-pay | Admitting: Family Medicine

## 2022-09-12 DIAGNOSIS — Z1231 Encounter for screening mammogram for malignant neoplasm of breast: Secondary | ICD-10-CM

## 2022-10-16 ENCOUNTER — Ambulatory Visit
Admission: RE | Admit: 2022-10-16 | Discharge: 2022-10-16 | Disposition: A | Discharge status: Home / Self Care | Payer: BC Managed Care – PPO | Source: Ambulatory Visit | Attending: Family Medicine | Admitting: Family Medicine

## 2022-10-16 DIAGNOSIS — Z1231 Encounter for screening mammogram for malignant neoplasm of breast: Secondary | ICD-10-CM

## 2022-12-21 DIAGNOSIS — E119 Type 2 diabetes mellitus without complications: Secondary | ICD-10-CM | POA: Diagnosis not present

## 2022-12-21 DIAGNOSIS — E538 Deficiency of other specified B group vitamins: Secondary | ICD-10-CM | POA: Diagnosis not present

## 2022-12-21 DIAGNOSIS — E785 Hyperlipidemia, unspecified: Secondary | ICD-10-CM | POA: Diagnosis not present

## 2022-12-25 ENCOUNTER — Telehealth: Payer: Self-pay | Admitting: Family Medicine

## 2022-12-25 LAB — CBC AND DIFFERENTIAL: Hemoglobin: 13 (ref 12.0–16.0)

## 2022-12-25 LAB — VITAMIN B12: Vitamin B-12: 611

## 2022-12-25 LAB — AMB RESULTS CONSOLE CBG: Glucose: 1.16

## 2022-12-25 LAB — LIPID PANEL
Cholesterol: 112 (ref 0–200)
HDL: 44 (ref 35–70)
LDL Cholesterol: 52
LDl/HDL Ratio: 2.5
Triglycerides: 77 (ref 40–160)

## 2022-12-25 LAB — COMPREHENSIVE METABOLIC PANEL: eGFR: 75

## 2022-12-25 LAB — HEMOGLOBIN A1C: Hemoglobin A1C: 6.1

## 2022-12-25 NOTE — Telephone Encounter (Signed)
FYI to PCP

## 2022-12-25 NOTE — Telephone Encounter (Signed)
Patient contacted the office regarding recent labs from endocrinology. States that her endocrinologist found that she is severely anemic, and that they would like for Dr. Milinda Antis to decide what patient should do such as an infusion etc. Advised pt that we could not see labs in her chart and to ask if her endo office could fax them to [place]. Advised I would also send this as we await labs

## 2022-12-25 NOTE — Telephone Encounter (Signed)
Thanks for letting me know  Last Hb here has been normal I will await those labs  -please let me know when we get them

## 2022-12-26 ENCOUNTER — Encounter: Payer: Self-pay | Admitting: Endocrinology

## 2022-12-26 ENCOUNTER — Telehealth: Payer: Self-pay | Admitting: Family Medicine

## 2022-12-26 DIAGNOSIS — E240 Pituitary-dependent Cushing's disease: Secondary | ICD-10-CM | POA: Diagnosis not present

## 2022-12-26 DIAGNOSIS — H903 Sensorineural hearing loss, bilateral: Secondary | ICD-10-CM | POA: Diagnosis not present

## 2022-12-26 NOTE — Telephone Encounter (Signed)
Received call yesterday that pt was anemic  I just reviewed the labs scanned today and her cbc is normal   I'm confused  Did she mean something else Am I missing a page?  Thanks

## 2022-12-29 NOTE — Telephone Encounter (Signed)
Called pt and she said that endo called her back and told her they called her with the wrong results and that her labs were normal. It was endo's error but she is aware now that labs are stable.

## 2023-01-05 DIAGNOSIS — K219 Gastro-esophageal reflux disease without esophagitis: Secondary | ICD-10-CM | POA: Diagnosis not present

## 2023-01-08 DIAGNOSIS — Z1389 Encounter for screening for other disorder: Secondary | ICD-10-CM | POA: Diagnosis not present

## 2023-01-08 DIAGNOSIS — Z0000 Encounter for general adult medical examination without abnormal findings: Secondary | ICD-10-CM | POA: Diagnosis not present

## 2023-01-15 ENCOUNTER — Other Ambulatory Visit: Payer: Self-pay | Admitting: Otolaryngology

## 2023-01-15 DIAGNOSIS — H9190 Unspecified hearing loss, unspecified ear: Secondary | ICD-10-CM

## 2023-01-19 ENCOUNTER — Other Ambulatory Visit (HOSPITAL_BASED_OUTPATIENT_CLINIC_OR_DEPARTMENT_OTHER): Payer: Self-pay

## 2023-01-23 ENCOUNTER — Other Ambulatory Visit: Payer: BC Managed Care – PPO

## 2023-01-25 ENCOUNTER — Other Ambulatory Visit: Payer: BC Managed Care – PPO

## 2023-02-01 ENCOUNTER — Ambulatory Visit
Admission: RE | Admit: 2023-02-01 | Discharge: 2023-02-01 | Disposition: A | Discharge status: Home / Self Care | Payer: BC Managed Care – PPO | Source: Ambulatory Visit | Attending: Otolaryngology | Admitting: Otolaryngology

## 2023-02-01 DIAGNOSIS — H9319 Tinnitus, unspecified ear: Secondary | ICD-10-CM | POA: Diagnosis not present

## 2023-02-01 DIAGNOSIS — H9190 Unspecified hearing loss, unspecified ear: Secondary | ICD-10-CM

## 2023-02-01 DIAGNOSIS — H9193 Unspecified hearing loss, bilateral: Secondary | ICD-10-CM | POA: Diagnosis not present

## 2023-02-01 MED ORDER — GADOPICLENOL 0.5 MMOL/ML IV SOLN
10.0000 mL | Freq: Once | INTRAVENOUS | Status: AC | PRN
  Administered 2023-02-01: 10 mL via INTRAVENOUS

## 2023-02-02 ENCOUNTER — Ambulatory Visit (INDEPENDENT_AMBULATORY_CARE_PROVIDER_SITE_OTHER): Payer: BC Managed Care – PPO | Admitting: Family Medicine

## 2023-02-02 ENCOUNTER — Encounter: Payer: Self-pay | Admitting: Family Medicine

## 2023-02-02 VITALS — BP 112/78 | HR 87 | Temp 98.4°F | Ht 65.5 in | Wt 248.0 lb

## 2023-02-02 DIAGNOSIS — E559 Vitamin D deficiency, unspecified: Secondary | ICD-10-CM | POA: Insufficient documentation

## 2023-02-02 DIAGNOSIS — Z79899 Other long term (current) drug therapy: Secondary | ICD-10-CM

## 2023-02-02 DIAGNOSIS — Z0000 Encounter for general adult medical examination without abnormal findings: Secondary | ICD-10-CM | POA: Diagnosis not present

## 2023-02-02 DIAGNOSIS — E282 Polycystic ovarian syndrome: Secondary | ICD-10-CM

## 2023-02-02 DIAGNOSIS — I10 Essential (primary) hypertension: Secondary | ICD-10-CM | POA: Diagnosis not present

## 2023-02-02 DIAGNOSIS — E538 Deficiency of other specified B group vitamins: Secondary | ICD-10-CM | POA: Diagnosis not present

## 2023-02-02 DIAGNOSIS — F411 Generalized anxiety disorder: Secondary | ICD-10-CM

## 2023-02-02 DIAGNOSIS — E781 Pure hyperglyceridemia: Secondary | ICD-10-CM | POA: Diagnosis not present

## 2023-02-02 DIAGNOSIS — H9325 Central auditory processing disorder: Secondary | ICD-10-CM

## 2023-02-02 DIAGNOSIS — Z7985 Long-term (current) use of injectable non-insulin antidiabetic drugs: Secondary | ICD-10-CM

## 2023-02-02 DIAGNOSIS — E119 Type 2 diabetes mellitus without complications: Secondary | ICD-10-CM

## 2023-02-02 MED ORDER — FENOFIBRIC ACID 135 MG PO CPDR: 1.0000 | DELAYED_RELEASE_CAPSULE | Freq: Every day | ORAL | 3 refills | Status: DC

## 2023-02-02 MED ORDER — METOPROLOL SUCCINATE ER 50 MG PO TB24: 50.0000 mg | ORAL_TABLET | Freq: Every day | ORAL | 3 refills | Status: DC

## 2023-02-02 MED ORDER — FLUTICASONE PROPIONATE 50 MCG/ACT NA SUSP: 2.0000 | Freq: Every day | NASAL | 3 refills | Status: DC

## 2023-02-02 MED ORDER — ONETOUCH VERIO VI STRP: ORAL_STRIP | 3 refills | Status: AC

## 2023-02-02 MED ORDER — ATORVASTATIN CALCIUM 20 MG PO TABS: ORAL_TABLET | ORAL | 3 refills | Status: DC

## 2023-02-02 NOTE — Progress Notes (Unsigned)
Subjective:    Patient ID: Gail [name], female    DOB: June 21, 1970, 52 y.o.   MRN: [id number]  HPI  Here for health maintenance exam and to review chronic medical problems   Wt Readings from Last 3 Encounters:  02/02/23 248 lb (112.5 kg)  01/30/22 238 lb 6.4 oz (108.1 kg)  01/26/21 257 lb (116.6 kg)   40.64 kg/m  Vitals:   02/02/23 1404  BP: 112/78  Pulse: 87  Temp: 98.4 F (36.9 C)  SpO2: 97%    Immunization History  Administered Date(s) Administered   PFIZER(Purple Top)SARS-COV-2 Vaccination 07/01/2019, 08/01/2019   Td 01/30/2022   Tdap 12/12/2011    Health Maintenance Due  Topic Date Due   OPHTHALMOLOGY EXAM  Never done   Colonoscopy  Never done   FOOT EXAM  12/20/2021   HEMOGLOBIN A1C  07/01/2022   Diabetic kidney evaluation - eGFR measurement  01/28/2023   Shingrix -declines   Can not take flu shot due to allergy/side effect    Mammogram was in july2024 Self breast exam- no lumps   Gyn health S/p hysterectomy  No problems   Colon cancer screening -per pt had at [facility] ? 2-3 y ago  06/18/19  10 year recall   Bone health  No falls  Fractures- none  Supplements - ? 2000iu daily  Calcium from diet  Last vitamin D Lab Results  Component Value Date   VD25OH 28 (L) 02/02/2023    Exercise  Walks  [facility]     Mood    01/30/2022    3:37 PM 12/20/2020    3:41 PM 11/04/2019    3:19 PM 10/22/2018   11:06 AM 09/26/2017    4:18 PM  Depression screen PHQ 2/9  Decreased Interest 0 0 0 0 0  Down, Depressed, Hopeless 0 0 0 0 0  PHQ - 2 Score 0 0 0 0 0  Altered sleeping 0  0 1   Tired, decreased energy 1  1 0   Change in appetite 0  1 0   Feeling bad or failure about yourself  0  0 0   Trouble concentrating 1  0 1   Moving slowly or fidgety/restless 0  0 0   Suicidal thoughts 0  0 0   PHQ-9 Score 2  2 2    Difficult doing work/chores Not difficult at all  Not difficult at all        Anxiety disorder  Off of lexapro A lot more  time for self care/ kids out of the house     HTN bp is stable today  No cp or palpitations or headaches or edema  No side effects to medicines  BP Readings from Last 3 Encounters:  02/02/23 112/78  01/30/22 122/62  01/26/21 120/84    Metoprolol xl 50 mg daily     Lab Results  Component Value Date   NA 138 01/27/2022   K 4.6 01/27/2022   CO2 29 01/27/2022   GLUCOSE 111 (H) 01/27/2022   BUN 11 01/27/2022   CREATININE 0.85 01/27/2022   CALCIUM 8.8 01/27/2022   GFR 80.01 01/27/2022   GFRNONAA >60 08/22/2020   Recent cmet normal with GFR 75 at endo  DM2 Sees endocrinology  Mounjaro 12.5 mg - working on that  [employer] back in 4 months  Last A1c was 6.1     GERD Protonix 40 mg daily -sees GI   B12 def  Lab Results  Component Value  Date   VITAMINB12 365 01/27/2022  Recent was 611  TSH from endocrinology last month 1.160  Cbc normal at endo in oct    Hyperlipidemia Lab Results  Component Value Date   CHOL 110 01/27/2022   HDL 41.30 01/27/2022   LDLCALC 54 01/27/2022   LDLDIRECT 109.0 10/15/2018   TRIG 69.0 01/27/2022   CHOLHDL 3 01/27/2022   More recently at endo office Total 112 Trig 77 HDL 44  LDL 52   Lipitor 20 mg daily  Fenofibrate 135 mg daily  Eating well also   Due for labs   Had MRI of brain recent from audiology   Has derm appointment in dec    Patient Active Problem List   Diagnosis Date Noted   Vitamin D deficiency 02/02/2023   Vitamin B12 deficiency 12/21/2020   Current use of proton pump inhibitor 12/12/2020   Hypertension 08/24/2020   Screening mammogram, encounter for 10/22/2018   PCOS (polycystic ovarian syndrome) 09/26/2017   Status post total abdominal hysterectomy 12/19/2016   Insomnia 07/22/2016   Auditory processing disorder 04/04/2016   Morbid obesity (HCC) 12/13/2012   Controlled type 2 diabetes mellitus without complication, without long-term current use of insulin (HCC) 12/13/2012   Routine general medical  examination at a health care facility 12/04/2011   HYPERTRIGLYCERIDEMIA 04/07/2008   PROBLEMS WITH HEARING 04/07/2008   Anxiety disorder 02/07/2008   ALLERGIC RHINITIS 02/07/2008   HYPOGLYCEMIA, REACTIVE 11/15/2006   Past Medical History:  Diagnosis Date   Anemia    Anxiety    Depression    Diabetes mellitus without complication (HCC)    type 2   Dyspnea    Hearing loss    wears hearing aids bilateral   History of allergic rhinitis    History of anxiety    History of blood transfusion    [facility] 09/2016 2 units transfused   History of depression    History of hyperlipidemia    History of syncope    Seizures (HCC)    hx seizures as a child,no seizures since age 64 yrs old   Sleep apnea    uses mouth guard at night   Past Surgical History:  Procedure Laterality Date   CESAREAN SECTION  1991, 1995   x 2   CYSTOSCOPY N/A 12/19/2016   Procedure: CYSTOSCOPY;  Surgeon: Patton Salles, MD;  Location: [facility];  Service: Gynecology;  Laterality: N/A;   TOTAL LAPAROSCOPIC HYSTERECTOMY WITH SALPINGECTOMY Bilateral 12/19/2016   Procedure: HYSTERECTOMY TOTAL LAPAROSCOPIC WITH SALPINGECTOMY;  Surgeon: Patton Salles, MD;  Location: [facility];  Service: Gynecology;  Laterality: Bilateral;   WISDOM TOOTH EXTRACTION     Social History   Tobacco Use   Smoking status: Never   Smokeless tobacco: Never  Vaping Use   Vaping status: Never Used  Substance Use Topics   Alcohol use: Yes    Alcohol/week: 0.0 standard drinks of alcohol    Comment: occasional  winie   Drug use: No   Family History  Problem Relation Age of Onset   Diabetes Father    Diabetes Paternal Grandfather    Prostate cancer Paternal Grandfather    Diabetes Paternal Grandmother    Allergies  Allergen Reactions   Haemophilus B Polysaccharide Vaccine Nausea And Vomiting    Vomiting   Influenza Vaccine Recombinant     Vomiting    Metformin And Related     Nausea vomiting   Penicillins Hives    Has  patient had  a PCN reaction causing immediate rash, facial/tongue/throat swelling, SOB or lightheadedness with hypotension: Yes Has patient had a PCN reaction causing severe rash involving mucus membranes or skin necrosis: No Has patient had a PCN reaction that required hospitalization: No Has patient had a PCN reaction occurring within the last 10 years: No If all of the above answers are "NO", then may proceed with Cephalosporin use.    Semaglutide     Took REBYLSUS: Other reaction(s): diarrhea   Current Outpatient Medications on File Prior to Visit  Medication Sig Dispense Refill   MOUNJARO 5 MG/0.5ML Pen Inject into the skin.     pantoprazole (PROTONIX) 40 MG tablet Take 1 tablet by mouth daily.     tirzepatide (MOUNJARO) 12.5 MG/0.5ML Pen Inject 12.5 mg into the skin once a week. 6 mL 0   No current facility-administered medications on file prior to visit.    Review of Systems  Constitutional:  Negative for activity change, appetite change, fatigue, fever and unexpected weight change.       Frustrated with inability to loose more weight   HENT:  Negative for congestion, ear pain, rhinorrhea, sinus pressure and sore throat.   Eyes:  Negative for pain, redness and visual disturbance.  Respiratory:  Negative for cough, shortness of breath and wheezing.   Cardiovascular:  Negative for chest pain and palpitations.  Gastrointestinal:  Negative for abdominal pain, blood in stool, constipation and diarrhea.  Endocrine: Negative for polydipsia and polyuria.  Genitourinary:  Negative for dysuria, frequency and urgency.  Musculoskeletal:  Negative for arthralgias, back pain and myalgias.  Skin:  Negative for pallor and rash.  Allergic/Immunologic: Negative for environmental allergies.  Neurological:  Negative for dizziness, syncope and headaches.  Hematological:  Negative for adenopathy. Does not bruise/bleed easily.  Psychiatric/Behavioral:  Negative for decreased concentration and  dysphoric mood. The patient is not nervous/anxious.        Objective:   Physical Exam Constitutional:      General: She is not in acute distress.    Appearance: Normal appearance. She is well-developed. She is obese. She is not ill-appearing or diaphoretic.  HENT:     Head: Normocephalic and atraumatic.     Right Ear: Tympanic membrane, ear canal and external ear normal.     Left Ear: Tympanic membrane, ear canal and external ear normal.     Nose: Nose normal. No congestion.     Mouth/Throat:     Mouth: Mucous membranes are moist.     Pharynx: Oropharynx is clear. No posterior oropharyngeal erythema.  Eyes:     General: No scleral icterus.    Extraocular Movements: Extraocular movements intact.     Conjunctiva/sclera: Conjunctivae normal.     Pupils: Pupils are equal, round, and reactive to light.  Neck:     Thyroid: No thyromegaly.     Vascular: No carotid bruit or JVD.  Cardiovascular:     Rate and Rhythm: Normal rate and regular rhythm.     Pulses: Normal pulses.     Heart sounds: Normal heart sounds.     No gallop.  Pulmonary:     Effort: Pulmonary effort is normal. No respiratory distress.     Breath sounds: Normal breath sounds. No wheezing.     Comments: Good air exch Chest:     Chest wall: No tenderness.  Abdominal:     General: Bowel sounds are normal. There is no distension or abdominal bruit.     Palpations: Abdomen is soft. There is  no mass.     Tenderness: There is no abdominal tenderness.     Hernia: No hernia is present.  Genitourinary:    Comments: Breast exam: No mass, nodules, thickening, tenderness, bulging, retraction, inflamation, nipple discharge or skin changes noted.  No axillary or clavicular LA.     Musculoskeletal:        General: No tenderness. Normal range of motion.     Cervical back: Normal range of motion and neck supple. No rigidity. No muscular tenderness.     Right lower leg: No edema.     Left lower leg: No edema.     Comments: No  kyphosis   Lymphadenopathy:     Cervical: No cervical adenopathy.  Skin:    General: Skin is warm and dry.     Coloration: Skin is not pale.     Findings: No erythema or rash.     Comments: Tanned Solar lentigines diffusely   Neurological:     Mental Status: She is alert. Mental status is at baseline.     Cranial Nerves: No cranial nerve deficit.     Motor: No abnormal muscle tone.     Coordination: Coordination normal.     Gait: Gait normal.     Deep Tendon Reflexes: Reflexes are normal and symmetric. Reflexes normal.  Psychiatric:        Mood and Affect: Mood normal.        Cognition and Memory: Cognition and memory normal.           Assessment & Plan:   Problem List Items Addressed This Visit       Cardiovascular and Mediastinum   Hypertension    bp in fair control at this time  BP Readings from Last 1 Encounters:  02/02/23 112/78   No changes needed Most recent labs reviewed  Disc lifstyle change with low sodium diet and exercise  Plan to continue metoprolol xl 50 mg daily        Relevant Medications   atorvastatin (LIPITOR) 20 MG tablet   Choline Fenofibrate (FENOFIBRIC ACID) 135 MG CPDR   metoprolol succinate (TOPROL-XL) 50 MG 24 hr tablet     Endocrine   Controlled type 2 diabetes mellitus without complication, without long-term current use of insulin (HCC)    Under care of endocrinology  Per pt last A1c 6.1  Continues mounjaro 12.5 mg weekly- thinks it is not working well for weight loss or glucose  Will follow up as planned        Relevant Medications   atorvastatin (LIPITOR) 20 MG tablet     Other   Vitamin D deficiency    Due for D level  Thinks she takes 2000 international units daily  Discussed importance to bone and overall health      Relevant Orders   VITAMIN D 25 Hydroxy (Vit-D Deficiency, Fractures) (Completed)   Vitamin B12 deficiency    Last B12 611 in oct at endocrinologist Encouraged to continue balanced diet and oral  supplementation       Routine general medical examination at a health care facility - Primary    Reviewed health habits including diet and exercise and skin cancer prevention Reviewed appropriate screening tests for age  Also reviewed health mt list, fam hx and immunization status , as well as social and family history   See HPI Labs reviewed and ordered Declines shingrix for now  Allergic to flu shot  S/p hysterectomy  Discussed fall prevention, supplements and exercise for  bone density  Sent for colonoscopy report from 05/2019   Health Maintenance  Topic Date Due   Eye exam for diabetics  Never done   Colon Cancer Screening  Never done   Complete foot exam   12/20/2021   Hemoglobin A1C  07/01/2022   Yearly kidney function blood test for diabetes  01/28/2023   Zoster (Shingles) Vaccine (1 of 2) 05/05/2023*   COVID-19 Vaccine (3 - Pfizer risk series) 02/01/2025*   Yearly kidney health urinalysis for diabetes  01/31/2027*   Mammogram  10/16/2023   DTaP/Tdap/Td vaccine (3 - Td or Tdap) 01/31/2032   Hepatitis C Screening  Completed   HIV Screening  Completed   HPV Vaccine  Aged Out  *Topic was postponed. The date shown is not the original due date.   PHQ 2 , thinks she is doing well off of ssri       Morbid obesity (HCC)    Conitnues to work on diet /exercise  Also mounjaro 12.5 mg weekly   Discussed how this problem influences overall health and the risks it imposes  Reviewed plan for weight loss with lower calorie diet (via better food choices (lower glycemic and portion control) along with exercise building up to or more than 30 minutes 5 days per week including some aerobic activity and strength training         HYPERTRIGLYCERIDEMIA    Reviewed labs from endocrinology from oct 2024  Total 112 Trig 77 HDL 44  LDL 52   Contineus Lipitor 20 mg daily  Fenofibrate 135 mg daily   Better diet  Encouraged to keep up the good work       Relevant Medications    atorvastatin (LIPITOR) 20 MG tablet   Choline Fenofibrate (FENOFIBRIC ACID) 135 MG CPDR   metoprolol succinate (TOPROL-XL) 50 MG 24 hr tablet   Current use of proton pump inhibitor    B12 level normal  D level drawn today   Continues protonix 40 mg daily      Auditory processing disorder    Recent MRI       Anxiety disorder    Doing better lately  Lower stress Off of lexapro with no problems   Encouraged continued good self care with physical exercise

## 2023-02-02 NOTE — Patient Instructions (Addendum)
Add some strength training to your routine, this is important for bone and brain health and can reduce your risk of falls and help your body use insulin properly and regulate weight  Light weights, exercise bands , and internet videos are a good way to start  Yoga (chair or regular), machines , floor exercises or a gym with machines are also good options   Vitamin D level today   Take care of yourself  Use sun protection

## 2023-02-03 LAB — VITAMIN D 25 HYDROXY (VIT D DEFICIENCY, FRACTURES): Vit D, 25-Hydroxy: 28 ng/mL — ABNORMAL LOW (ref 30–100)

## 2023-02-03 NOTE — Assessment & Plan Note (Signed)
bp in fair control at this time  BP Readings from Last 1 Encounters:  02/02/23 112/78   No changes needed Most recent labs reviewed  Disc lifstyle change with low sodium diet and exercise  Plan to continue metoprolol xl 50 mg daily

## 2023-02-03 NOTE — Assessment & Plan Note (Signed)
Reviewed labs from endocrinology from oct 2024  Total 112 Trig 77 HDL 44  LDL 52   Contineus Lipitor 20 mg daily  Fenofibrate 135 mg daily   Better diet  Encouraged to keep up the good work

## 2023-02-03 NOTE — Assessment & Plan Note (Signed)
Under care of endocrinology  Per pt last A1c 6.1  Continues mounjaro 12.5 mg weekly- thinks it is not working well for weight loss or glucose  Will follow up as planned

## 2023-02-03 NOTE — Assessment & Plan Note (Signed)
Due for D level  Thinks she takes 2000 international units daily  Discussed importance to bone and overall health

## 2023-02-03 NOTE — Assessment & Plan Note (Signed)
Recent MRI

## 2023-02-03 NOTE — Assessment & Plan Note (Signed)
Last B12 611 in oct at endocrinologist Encouraged to continue balanced diet and oral supplementation

## 2023-02-03 NOTE — Assessment & Plan Note (Signed)
Reviewed health habits including diet and exercise and skin cancer prevention Reviewed appropriate screening tests for age  Also reviewed health mt list, fam hx and immunization status , as well as social and family history   See HPI Labs reviewed and ordered Declines shingrix for now  Allergic to flu shot  S/p hysterectomy  Discussed fall prevention, supplements and exercise for bone density  Sent for colonoscopy report from 05/2019   Health Maintenance  Topic Date Due   Eye exam for diabetics  Never done   Colon Cancer Screening  Never done   Complete foot exam   12/20/2021   Hemoglobin A1C  07/01/2022   Yearly kidney function blood test for diabetes  01/28/2023   Zoster (Shingles) Vaccine (1 of 2) 05/05/2023*   COVID-19 Vaccine (3 - Pfizer risk series) 02/01/2025*   Yearly kidney health urinalysis for diabetes  01/31/2027*   Mammogram  10/16/2023   DTaP/Tdap/Td vaccine (3 - Td or Tdap) 01/31/2032   Hepatitis C Screening  Completed   HIV Screening  Completed   HPV Vaccine  Aged Out  *Topic was postponed. The date shown is not the original due date.   PHQ 2 , thinks she is doing well off of ssri

## 2023-02-03 NOTE — Assessment & Plan Note (Signed)
Doing better lately  Lower stress Off of lexapro with no problems   Encouraged continued good self care with physical exercise

## 2023-02-03 NOTE — Assessment & Plan Note (Signed)
Conitnues to work on diet /exercise  Also mounjaro 12.5 mg weekly   Discussed how this problem influences overall health and the risks it imposes  Reviewed plan for weight loss with lower calorie diet (via better food choices (lower glycemic and portion control) along with exercise building up to or more than 30 minutes 5 days per week including some aerobic activity and strength training

## 2023-02-03 NOTE — Assessment & Plan Note (Signed)
B12 level normal  D level drawn today   Continues protonix 40 mg daily

## 2023-02-08 ENCOUNTER — Encounter: Payer: Self-pay | Admitting: Family Medicine

## 2023-02-09 ENCOUNTER — Other Ambulatory Visit (HOSPITAL_BASED_OUTPATIENT_CLINIC_OR_DEPARTMENT_OTHER): Payer: Self-pay

## 2023-02-27 ENCOUNTER — Encounter: Payer: Self-pay | Admitting: Family Medicine

## 2023-03-12 ENCOUNTER — Ambulatory Visit: Payer: BC Managed Care – PPO | Admitting: Dermatology

## 2023-03-15 ENCOUNTER — Ambulatory Visit: Payer: BC Managed Care – PPO | Admitting: Dermatology

## 2023-03-15 ENCOUNTER — Encounter: Payer: Self-pay | Admitting: Dermatology

## 2023-03-15 VITALS — BP 107/69 | HR 83

## 2023-03-15 DIAGNOSIS — L578 Other skin changes due to chronic exposure to nonionizing radiation: Secondary | ICD-10-CM

## 2023-03-15 DIAGNOSIS — W908XXA Exposure to other nonionizing radiation, initial encounter: Secondary | ICD-10-CM

## 2023-03-15 DIAGNOSIS — D1801 Hemangioma of skin and subcutaneous tissue: Secondary | ICD-10-CM

## 2023-03-15 DIAGNOSIS — L821 Other seborrheic keratosis: Secondary | ICD-10-CM | POA: Diagnosis not present

## 2023-03-15 DIAGNOSIS — D229 Melanocytic nevi, unspecified: Secondary | ICD-10-CM

## 2023-03-15 DIAGNOSIS — Z1283 Encounter for screening for malignant neoplasm of skin: Secondary | ICD-10-CM | POA: Diagnosis not present

## 2023-03-15 DIAGNOSIS — D492 Neoplasm of unspecified behavior of bone, soft tissue, and skin: Secondary | ICD-10-CM | POA: Diagnosis not present

## 2023-03-15 DIAGNOSIS — B079 Viral wart, unspecified: Secondary | ICD-10-CM | POA: Diagnosis not present

## 2023-03-15 DIAGNOSIS — D485 Neoplasm of uncertain behavior of skin: Secondary | ICD-10-CM

## 2023-03-15 DIAGNOSIS — L814 Other melanin hyperpigmentation: Secondary | ICD-10-CM

## 2023-03-15 NOTE — Patient Instructions (Addendum)
Patient Handout: Wound Care for Skin Biopsy Site  Taking Care of Your Skin Biopsy Site  Proper care of the biopsy site is essential for promoting healing and minimizing scarring. This handout provides instructions on how to care for your biopsy site to ensure optimal recovery.  1. Cleaning the Wound:  Clean the biopsy site daily with gentle soap and water. Gently pat the area dry with a clean, soft towel. Avoid harsh scrubbing or rubbing the area, as this can irritate the skin and delay healing.  2. Applying Aquaphor and Bandage:  After cleaning the wound, apply a thin layer of Aquaphor ointment to the biopsy site. Cover the area with a sterile bandage to protect it from dirt, bacteria, and friction. Change the bandage daily or as needed if it becomes soiled or wet.  3. Continued Care for One Week:  Repeat the cleaning, Aquaphor application, and bandaging process daily for one week following the biopsy procedure. Keeping the wound clean and moist during this initial healing period will help prevent infection and promote optimal healing.  4. Massaging Aquaphor into the Area:  ---After one week, discontinue the use of bandages but continue to apply Aquaphor to the biopsy site. ----Gently massage the Aquaphor into the area using circular motions. ---Massaging the skin helps to promote circulation and prevent the formation of scar tissue.   Additional Tips:  Avoid exposing the biopsy site to direct sunlight during the healing process, as this can cause hyperpigmentation or worsen scarring. If you experience any signs of infection, such as increased redness, swelling, warmth, or drainage from the wound, contact your healthcare provider immediately. Follow any additional instructions provided by your healthcare provider for caring for the biopsy site and managing any discomfort. Conclusion:  Taking proper care of your skin biopsy site is crucial for ensuring optimal healing and  minimizing scarring. By following these instructions for cleaning, applying Aquaphor, and massaging the area, you can promote a smooth and successful recovery. If you have any questions or concerns about caring for your biopsy site, don't hesitate to contact your healthcare provider for guidance.    Skin Education : We  counseled the patient regarding the following: Sun screen (SPF 30 or greater) should be applied during peak UV exposure (between 10am and 2pm) and reapplied after exercise or swimming.  The ABCDEs of melanoma were reviewed with the patient, and the importance of monthly self-examination of moles was emphasized. Should any moles change in shape or color, or itch, bleed or burn, pt will contact our office for evaluation sooner then their interval appointment.  Plan: Sunscreen Recommendations We recommended a broad spectrum sunscreen with a SPF of 30 or higher.  SPF 30 sunscreens block approximately 97 percent of the sun's harmful rays. Sunscreens should be applied at least 15 minutes prior to expected sun exposure and then every 2 hours after that as long as sun exposure continues. If swimming or exercising sunscreen should be reapplied every 45 minutes to an hour after getting wet or sweating. One ounce, or the equivalent of a shot glass full of sunscreen, is adequate to protect the skin not covered by a bathing suit. We also recommended a lip balm with a sunscreen as well. Sun protective clothing can be used in lieu of sunscreen but must be worn the entire time you are exposed to the sun's rays. Important Information   Due to recent changes in healthcare laws, you may see results of your pathology and/or laboratory studies on MyChart before  the doctors have had a chance to review them. We understand that in some cases there may be results that are confusing or concerning to you. Please understand that not all results are received at the same time and often the doctors may need to interpret  multiple results in order to provide you with the best plan of care or course of treatment. Therefore, we ask that you please give [place] 2 business days to thoroughly review all your results before contacting the office for clarification. Should we see a critical lab result, you will be contacted sooner.     If You Need Anything After Your Visit   If you have any questions or concerns for your doctor, please call our main line at [phone]. If no one answers, please leave a voicemail as directed and we will return your call as soon as possible. Messages left after 4 pm will be answered the following business day.    You may also send [place] a message via MyChart. We typically respond to MyChart messages within 1-2 business days.  For prescription refills, please ask your pharmacy to contact our office. Our fax number is [phone].  If you have an urgent issue when the clinic is closed that cannot wait until the next business day, you can page your doctor at the number below.     Please note that while we do our best to be available for urgent issues outside of office hours, we are not available 24/7.    If you have an urgent issue and are unable to reach [place], you may choose to seek medical care at your doctor's office, retail clinic, urgent care center, or emergency room.   If you have a medical emergency, please immediately call 911 or go to the emergency department. In the event of inclement weather, please call our main line at [phone] for an update on the status of any delays or closures.  Dermatology Medication Tips: Please keep the boxes that topical medications come in in order to help keep track of the instructions about where and how to use these. Pharmacies typically print the medication instructions only on the boxes and not directly on the medication tubes.   If your medication is too expensive, please contact our office at [phone] or send [place] a message through MyChart.     We are unable to tell what your co-pay for medications will be in advance as this is different depending on your insurance coverage. However, we may be able to find a substitute medication at lower cost or fill out paperwork to get insurance to cover a needed medication.    If a prior authorization is required to get your medication covered by your insurance company, please allow [place] 1-2 business days to complete this process.   Drug prices often vary depending on where the prescription is filled and some pharmacies may offer cheaper prices.   The website www.goodrx.com contains coupons for medications through different pharmacies. The prices here do not account for what the cost may be with help from insurance (it may be cheaper with your insurance), but the website can give you the price if you did not use any insurance.  - You can print the associated coupon and take it with your prescription to the pharmacy.  - You may also stop by our office during regular business hours and pick up a GoodRx coupon card.  - If you need your prescription sent electronically to a different pharmacy, notify  our office through [facility] or by phone at [phone]

## 2023-03-15 NOTE — Progress Notes (Signed)
   New Patient Visit   Subjective  Gail [name] is a 52 y.o. female who presents for the following: Skin Cancer Screening and Full Body Skin Exam  The patient presents for Total-Body Skin Exam (TBSE) for skin cancer screening and mole check. The patient has spots, moles and lesions to be evaluated, some may be new or changing. Pt has no hx of skin cancer, possible family hx of NMSC  The following portions of the chart were reviewed this encounter and updated as appropriate: medications, allergies, medical history  Review of Systems:  No other skin or systemic complaints except as noted in HPI or Assessment and Plan.  Objective  Well appearing patient in no apparent distress; mood and affect are within normal limits.  A full examination was performed including scalp, head, eyes, ears, nose, lips, neck, chest, axillae, abdomen, back, buttocks, bilateral upper extremities, bilateral lower extremities, hands, feet, fingers, toes, fingernails, and toenails. All findings within normal limits unless otherwise noted below.   Relevant physical exam findings are noted in the Assessment and Plan.  mid back 5mm Crusted papule   Assessment & Plan   SKIN CANCER SCREENING PERFORMED TODAY.  ACTINIC DAMAGE - Chronic condition, secondary to cumulative UV/sun exposure - diffuse scaly erythematous macules with underlying dyspigmentation - Recommend daily broad spectrum sunscreen SPF 30+ to sun-exposed areas, reapply every 2 hours as needed.  - Staying in the shade or wearing long sleeves, sun glasses (UVA+UVB protection) and wide brim hats (4-inch brim around the entire circumference of the hat) are also recommended for sun protection.  - Call for new or changing lesions.  MELANOCYTIC NEVI - Tan-brown and/or pink-flesh-colored symmetric macules and papules - Benign appearing on exam today - Observation - Call clinic for new or changing moles - Recommend daily use of broad spectrum spf 30+  sunscreen to sun-exposed areas.   SEBORRHEIC KERATOSIS - Stuck-on, waxy, tan-brown papules and/or plaques  - Benign-appearing - Discussed benign etiology and prognosis. - Observe - Call for any changes  LENTIGINES Exam: scattered tan macules Due to sun exposure Treatment Plan: Benign-appearing, observe. Recommend daily broad spectrum sunscreen SPF 30+ to sun-exposed areas, reapply every 2 hours as needed.  Call for any changes    HEMANGIOMA Exam: red papule(s) Discussed benign nature. Recommend observation. Call for changes.   NEOPLASM OF UNCERTAIN BEHAVIOR OF SKIN mid back Skin / nail biopsy Type of biopsy: tangential   Informed consent: discussed and consent obtained   Timeout: patient name, date of birth, surgical site, and procedure verified   Procedure prep:  Patient was prepped and draped in usual sterile fashion Prep type:  Isopropyl alcohol Anesthesia: the lesion was anesthetized in a standard fashion   Anesthetic:  1% lidocaine w/ epinephrine 1-100,000 buffered w/ 8.4% NaHCO3 Instrument used: DermaBlade   Hemostasis achieved with: pressure, aluminum chloride and electrodesiccation   Outcome: patient tolerated procedure well   Post-procedure details: sterile dressing applied and wound care instructions given   Dressing type: pressure dressing and bacitracin   Specimen 1 - Surgical pathology Differential Diagnosis: R/O ISK vs wart  Check Margins: No  Return in about 2 years (around 03/14/2025) for TBSE.  I, Tillie Fantasia, CMA, am acting as scribe for Gwenith Daily, MD.   Documentation: I have reviewed the above documentation for accuracy and completeness, and I agree with the above.  Gwenith Daily, MD

## 2023-03-19 LAB — SURGICAL PATHOLOGY

## 2023-04-05 DIAGNOSIS — E119 Type 2 diabetes mellitus without complications: Secondary | ICD-10-CM | POA: Diagnosis not present

## 2023-04-16 ENCOUNTER — Other Ambulatory Visit (HOSPITAL_BASED_OUTPATIENT_CLINIC_OR_DEPARTMENT_OTHER): Payer: Self-pay

## 2023-04-16 ENCOUNTER — Encounter (HOSPITAL_BASED_OUTPATIENT_CLINIC_OR_DEPARTMENT_OTHER): Payer: Self-pay

## 2023-04-16 MED ORDER — MOUNJARO 10 MG/0.5ML ~~LOC~~ SOAJ
10.0000 mg | SUBCUTANEOUS | 3 refills | Status: AC
  Filled 2023-04-16 – 2023-04-17 (×2): qty 2, 28d supply, fill #0
  Filled 2023-04-17: qty 6, 84d supply, fill #0
  Filled 2023-05-15 – 2023-05-16 (×2): qty 2, 28d supply, fill #1

## 2023-04-17 ENCOUNTER — Other Ambulatory Visit (HOSPITAL_BASED_OUTPATIENT_CLINIC_OR_DEPARTMENT_OTHER): Payer: Self-pay

## 2023-05-07 ENCOUNTER — Other Ambulatory Visit (HOSPITAL_BASED_OUTPATIENT_CLINIC_OR_DEPARTMENT_OTHER): Payer: Self-pay

## 2023-05-15 ENCOUNTER — Other Ambulatory Visit (HOSPITAL_BASED_OUTPATIENT_CLINIC_OR_DEPARTMENT_OTHER): Payer: Self-pay

## 2023-05-16 ENCOUNTER — Other Ambulatory Visit (HOSPITAL_BASED_OUTPATIENT_CLINIC_OR_DEPARTMENT_OTHER): Payer: Self-pay

## 2023-06-18 DIAGNOSIS — E119 Type 2 diabetes mellitus without complications: Secondary | ICD-10-CM | POA: Diagnosis not present

## 2023-09-25 ENCOUNTER — Telehealth: Payer: Self-pay | Admitting: *Deleted

## 2023-09-25 DIAGNOSIS — E119 Type 2 diabetes mellitus without complications: Secondary | ICD-10-CM | POA: Diagnosis not present

## 2023-09-25 NOTE — Telephone Encounter (Signed)
 Does she want to go back on lexapro  ? Please get some details, thanks   I am currently out of town/out of the office

## 2023-09-25 NOTE — Telephone Encounter (Signed)
 Copied from CRM [id number]. Topic: Clinical - Medication Question >> Sep 25, 2023 12:15 PM Abigail D wrote: Reason for CRM: Patient would like to get back on anxiety medications, she is still on the Mounjaro  10mg  and would like to confirm that there is a med she can take with this for anxiety.

## 2023-09-26 MED ORDER — ESCITALOPRAM OXALATE 10 MG PO TABS: 10.0000 mg | ORAL_TABLET | Freq: Every day | ORAL | 1 refills | Status: DC

## 2023-09-26 NOTE — Telephone Encounter (Signed)
 I sent it  Did not see any warnings about interactions  Hope it helps and you have a good trip

## 2023-09-26 NOTE — Telephone Encounter (Signed)
 I spoke with pt;pt said that she is doing good but pt is going on her first cruise from 10/05/23 - 10/14/23 and is anxious about sailing and going on the cruise. Pts family asked her to contact Dr Randeen to get something for anxiety. Pt said when she took Lexapro  10 mg daily it did help her anxiety, pt wants to make sure Lexapro  is compatible since pt is taking Mounjaro . [facility] [place] ; [facility] for [facility]. Sending note to Dr Randeen.SABRA

## 2023-09-27 ENCOUNTER — Other Ambulatory Visit: Payer: Self-pay | Admitting: Family Medicine

## 2023-09-27 DIAGNOSIS — Z1231 Encounter for screening mammogram for malignant neoplasm of breast: Secondary | ICD-10-CM

## 2023-09-27 NOTE — Telephone Encounter (Signed)
Pt notified Rx sent to pharmacy and advised of Dr. Tower's comments  

## 2023-10-18 ENCOUNTER — Ambulatory Visit
Admission: RE | Admit: 2023-10-18 | Discharge: 2023-10-18 | Disposition: A | Discharge status: Home / Self Care | Source: Ambulatory Visit | Attending: Family Medicine | Admitting: Family Medicine

## 2023-10-18 DIAGNOSIS — Z1231 Encounter for screening mammogram for malignant neoplasm of breast: Secondary | ICD-10-CM | POA: Diagnosis not present

## 2023-10-22 DIAGNOSIS — E119 Type 2 diabetes mellitus without complications: Secondary | ICD-10-CM | POA: Diagnosis not present

## 2023-10-23 ENCOUNTER — Ambulatory Visit: Payer: Self-pay | Admitting: Family Medicine

## 2023-12-21 DIAGNOSIS — K2930 Chronic superficial gastritis without bleeding: Secondary | ICD-10-CM | POA: Diagnosis not present

## 2023-12-21 DIAGNOSIS — K219 Gastro-esophageal reflux disease without esophagitis: Secondary | ICD-10-CM | POA: Diagnosis not present

## 2024-01-21 ENCOUNTER — Other Ambulatory Visit: Payer: Self-pay | Admitting: Family Medicine

## 2024-01-21 DIAGNOSIS — E781 Pure hyperglyceridemia: Secondary | ICD-10-CM

## 2024-01-22 NOTE — Telephone Encounter (Signed)
 Pt is due for her CPE (labs prior) on or after 02/03/24, please schedule and then route back to me

## 2024-01-23 DIAGNOSIS — E119 Type 2 diabetes mellitus without complications: Secondary | ICD-10-CM | POA: Diagnosis not present

## 2024-01-23 NOTE — Telephone Encounter (Signed)
 I spoked with patient and she has been scheduled.

## 2024-02-24 ENCOUNTER — Telehealth: Payer: Self-pay | Admitting: Family Medicine

## 2024-02-24 DIAGNOSIS — E559 Vitamin D deficiency, unspecified: Secondary | ICD-10-CM

## 2024-02-24 DIAGNOSIS — E781 Pure hyperglyceridemia: Secondary | ICD-10-CM

## 2024-02-24 DIAGNOSIS — I10 Essential (primary) hypertension: Secondary | ICD-10-CM

## 2024-02-24 DIAGNOSIS — E538 Deficiency of other specified B group vitamins: Secondary | ICD-10-CM

## 2024-02-24 NOTE — Telephone Encounter (Signed)
-----   Message from Gail [name] sent at 02/13/2024 11:04 AM EST ----- Regarding: Labs Wed 02/27/24 Hello,  Patient is coming in for CPE labs. Can we get orders please.   Thanks

## 2024-02-27 ENCOUNTER — Other Ambulatory Visit

## 2024-03-05 ENCOUNTER — Encounter: Admitting: Family Medicine

## 2024-03-20 ENCOUNTER — Other Ambulatory Visit: Payer: Self-pay | Admitting: Family Medicine

## 2024-03-21 ENCOUNTER — Other Ambulatory Visit

## 2024-03-21 DIAGNOSIS — E538 Deficiency of other specified B group vitamins: Secondary | ICD-10-CM | POA: Diagnosis not present

## 2024-03-21 DIAGNOSIS — E781 Pure hyperglyceridemia: Secondary | ICD-10-CM

## 2024-03-21 DIAGNOSIS — I10 Essential (primary) hypertension: Secondary | ICD-10-CM

## 2024-03-21 DIAGNOSIS — E559 Vitamin D deficiency, unspecified: Secondary | ICD-10-CM

## 2024-03-21 LAB — LIPID PANEL
Cholesterol: 131 mg/dL (ref 28–200)
HDL: 41.6 mg/dL
LDL Cholesterol: 72 mg/dL (ref 10–99)
NonHDL: 89.56
Total CHOL/HDL Ratio: 3
Triglycerides: 90 mg/dL (ref 10.0–149.0)
VLDL: 18 mg/dL (ref 0.0–40.0)

## 2024-03-21 LAB — COMPREHENSIVE METABOLIC PANEL WITH GFR
ALT: 18 U/L (ref 3–35)
AST: 17 U/L (ref 5–37)
Albumin: 4.4 g/dL (ref 3.5–5.2)
Alkaline Phosphatase: 36 U/L — ABNORMAL LOW (ref 39–117)
BUN: 17 mg/dL (ref 6–23)
CO2: 30 meq/L (ref 19–32)
Calcium: 8.9 mg/dL (ref 8.4–10.5)
Chloride: 105 meq/L (ref 96–112)
Creatinine, Ser: 0.8 mg/dL (ref 0.40–1.20)
GFR: 84.76 mL/min
Glucose, Bld: 123 mg/dL — ABNORMAL HIGH (ref 70–99)
Potassium: 4.2 meq/L (ref 3.5–5.1)
Sodium: 142 meq/L (ref 135–145)
Total Bilirubin: 0.5 mg/dL (ref 0.2–1.2)
Total Protein: 6.7 g/dL (ref 6.0–8.3)

## 2024-03-21 LAB — CBC WITH DIFFERENTIAL/PLATELET
Basophils Absolute: 0 K/uL (ref 0.0–0.1)
Basophils Relative: 0.4 % (ref 0.0–3.0)
Eosinophils Absolute: 0.1 K/uL (ref 0.0–0.7)
Eosinophils Relative: 1.7 % (ref 0.0–5.0)
HCT: 39.1 % (ref 36.0–46.0)
Hemoglobin: 13.1 g/dL (ref 12.0–15.0)
Lymphocytes Relative: 45.2 % (ref 12.0–46.0)
Lymphs Abs: 2.1 K/uL (ref 0.7–4.0)
MCHC: 33.6 g/dL (ref 30.0–36.0)
MCV: 85.4 fl (ref 78.0–100.0)
Monocytes Absolute: 0.3 K/uL (ref 0.1–1.0)
Monocytes Relative: 5.4 % (ref 3.0–12.0)
Neutro Abs: 2.2 K/uL (ref 1.4–7.7)
Neutrophils Relative %: 47.3 % (ref 43.0–77.0)
Platelets: 229 K/uL (ref 150.0–400.0)
RBC: 4.58 Mil/uL (ref 3.87–5.11)
RDW: 14.1 % (ref 11.5–15.5)
WBC: 4.7 K/uL (ref 4.0–10.5)

## 2024-03-21 LAB — VITAMIN D 25 HYDROXY (VIT D DEFICIENCY, FRACTURES): VITD: 21.95 ng/mL — ABNORMAL LOW (ref 30.00–100.00)

## 2024-03-21 LAB — VITAMIN B12: Vitamin B-12: 749 pg/mL (ref 211–911)

## 2024-03-21 LAB — TSH: TSH: 1.62 u[IU]/mL (ref 0.35–5.50)

## 2024-03-23 ENCOUNTER — Ambulatory Visit: Payer: Self-pay | Admitting: Family Medicine

## 2024-03-31 ENCOUNTER — Ambulatory Visit: Admitting: Family Medicine

## 2024-03-31 ENCOUNTER — Encounter: Payer: Self-pay | Admitting: Family Medicine

## 2024-03-31 VITALS — BP 110/68 | HR 74 | Temp 98.5°F | Ht 65.5 in | Wt 247.4 lb

## 2024-03-31 DIAGNOSIS — E282 Polycystic ovarian syndrome: Secondary | ICD-10-CM

## 2024-03-31 DIAGNOSIS — E781 Pure hyperglyceridemia: Secondary | ICD-10-CM | POA: Diagnosis not present

## 2024-03-31 DIAGNOSIS — F411 Generalized anxiety disorder: Secondary | ICD-10-CM

## 2024-03-31 DIAGNOSIS — E119 Type 2 diabetes mellitus without complications: Secondary | ICD-10-CM

## 2024-03-31 DIAGNOSIS — E559 Vitamin D deficiency, unspecified: Secondary | ICD-10-CM

## 2024-03-31 DIAGNOSIS — E538 Deficiency of other specified B group vitamins: Secondary | ICD-10-CM | POA: Diagnosis not present

## 2024-03-31 DIAGNOSIS — Z79899 Other long term (current) drug therapy: Secondary | ICD-10-CM

## 2024-03-31 DIAGNOSIS — Z0000 Encounter for general adult medical examination without abnormal findings: Secondary | ICD-10-CM | POA: Diagnosis not present

## 2024-03-31 DIAGNOSIS — Z6841 Body Mass Index (BMI) 40.0 and over, adult: Secondary | ICD-10-CM | POA: Diagnosis not present

## 2024-03-31 DIAGNOSIS — I10 Essential (primary) hypertension: Secondary | ICD-10-CM | POA: Diagnosis not present

## 2024-03-31 MED ORDER — METOPROLOL SUCCINATE ER 50 MG PO TB24: 50.0000 mg | ORAL_TABLET | Freq: Every day | ORAL | 3 refills | Status: AC

## 2024-03-31 NOTE — Assessment & Plan Note (Signed)
 Discussed how this problem influences overall health and the risks it imposes  Reviewed plan for weight loss with lower calorie diet (via better food choices (lower glycemic and portion control) along with exercise building up to or more than 30 minutes 5 days per week including some aerobic activity and strength training   Taking glp-1 at highest tolerated dose  Starting at gym also  Bmi 40.5  Co morbidities of HTN , DM2 and hyperlipidemia Also pcos in past

## 2024-03-31 NOTE — Progress Notes (Signed)
 "  Subjective:    Patient ID: Gail [name], female    DOB: November 05, 1970, 54 y.o.   MRN: [id number]  HPI  Here for health maintenance exam and to review chronic medical problems   Wt Readings from Last 3 Encounters:  03/31/24 247 lb 6 oz (112.2 kg)  02/02/23 248 lb (112.5 kg)  01/30/22 238 lb 6.4 oz (108.1 kg)   40.54 kg/m  Vitals:   03/31/24 1537  BP: 110/68  Pulse: 74  Temp: 98.5 F (36.9 C)  SpO2: 97%    Immunization History  Administered Date(s) Administered   PFIZER(Purple Top)SARS-COV-2 Vaccination 07/01/2019, 08/01/2019   Td 01/30/2022   Tdap 12/12/2011    Health Maintenance Due  Topic Date Due   Pneumococcal Vaccine: 50+ Years (1 of 2 - PCV) Never done   Hepatitis B Vaccines 19-59 Average Risk (1 of 3 - 19+ 3-dose series) Never done   HEMOGLOBIN A1C  06/25/2023   FOOT EXAM  02/02/2024   Taking care of herself   Shingrix - not interested    Mammogram 09/2023  Self breast exam  Gyn health Past hysterectomy  PCOS     Colon cancer screening  Colonoscopy 01/2020 with 10 y recall   Bone health   Falls-none  Fractures-none  Supplements -vitamin D  3      1000 international units daily  Last vitamin D  Lab Results  Component Value Date   VD25OH 21.95 (L) 03/21/2024    Exercise  Going to [employer] - will be working with a trainer   Does see dermatology  Is due for a visit   Mood    03/31/2024    3:41 PM 01/30/2022    3:37 PM 12/20/2020    3:41 PM 11/04/2019    3:19 PM 10/22/2018   11:06 AM  Depression screen PHQ 2/9  Decreased Interest 0 0 0 0 0  Down, Depressed, Hopeless 0 0 0 0 0  PHQ - 2 Score 0 0 0 0 0  Altered sleeping 0 0  0 1  Tired, decreased energy 2 1  1  0  Change in appetite 0 0  1 0  Feeling bad or failure about yourself  0 0  0 0  Trouble concentrating 0 1  0 1  Moving slowly or fidgety/restless 0 0  0 0  Suicidal thoughts 0 0  0 0  PHQ-9 Score 2 2   2  2    Difficult doing work/chores  Not difficult at all  Not  difficult at all      Data saved with a previous flowsheet row definition     Lexapro  10 mg daily -doing well on this dose  History of anxiety disorder    HTN bp is stable today  No cp or palpitations or headaches or edema  No side effects to medicines  BP Readings from Last 3 Encounters:  03/31/24 110/68  03/15/23 107/69  02/02/23 112/78    Metoprolol  xl 50 mg daily   Lab Results  Component Value Date   NA 142 03/21/2024   K 4.2 03/21/2024   CO2 30 03/21/2024   GLUCOSE 123 (H) 03/21/2024   BUN 17 03/21/2024   CREATININE 0.80 03/21/2024   CALCIUM  8.9 03/21/2024   GFR 84.76 03/21/2024   EGFR 75 12/25/2022   GFRNONAA >60 08/22/2020    DM2  Lab Results  Component Value Date   HGBA1C 6.1 12/25/2022   HGBA1C 7.1 (H) 10/24/2019   HGBA1C 6.3 10/15/2018  No results found for: CORDIE MACKEY [facility]  Dr Nichole  Needs a new endocrinologist    Mounjaro  10 mg weekly- did not tolerate higher doses     GERD/gastritis  Pantoprazole 40 mg daily   Lab Results  Component Value Date   VITAMINB12 749 03/21/2024    Hyperlipidemia Lab Results  Component Value Date   CHOL 131 03/21/2024   CHOL 112 12/25/2022   CHOL 110 01/27/2022   Lab Results  Component Value Date   HDL 41.60 03/21/2024   HDL 44 12/25/2022   HDL 41.30 01/27/2022   Lab Results  Component Value Date   LDLCALC 72 03/21/2024   LDLCALC 52 12/25/2022   LDLCALC 54 01/27/2022   Lab Results  Component Value Date   TRIG 90.0 03/21/2024   TRIG 77 12/25/2022   TRIG 69.0 01/27/2022   Lab Results  Component Value Date   CHOLHDL 3 03/21/2024   CHOLHDL 3 01/27/2022   CHOLHDL 3 12/20/2020   Lab Results  Component Value Date   LDLDIRECT 109.0 10/15/2018   LDLDIRECT 106.4 12/05/2012   Atorvastatin  20 mg daily  Fenofibrate  135 mg daily   Was on a cruise - ate more fatty foods   Lab Results  Component Value Date   TSH 1.62 03/21/2024   Lab Results  Component Value  Date   ALT 18 03/21/2024   AST 17 03/21/2024   ALKPHOS 36 (L) 03/21/2024   BILITOT 0.5 03/21/2024    Lab Results  Component Value Date   WBC 4.7 03/21/2024   HGB 13.1 03/21/2024   HCT 39.1 03/21/2024   MCV 85.4 03/21/2024   PLT 229.0 03/21/2024     Patient Active Problem List   Diagnosis Date Noted   Vitamin D  deficiency 02/02/2023   Vitamin B12 deficiency 12/21/2020   Current use of proton pump inhibitor 12/12/2020   Hypertension 08/24/2020   Screening mammogram, encounter for 10/22/2018   PCOS (polycystic ovarian syndrome) 09/26/2017   Status post total abdominal hysterectomy 12/19/2016   Insomnia 07/22/2016   Auditory processing disorder 04/04/2016   Morbid obesity (HCC) 12/13/2012   Controlled type 2 diabetes mellitus without complication, without long-term current use of insulin (HCC) 12/13/2012   Routine general medical examination at a health care facility 12/04/2011   HYPERTRIGLYCERIDEMIA 04/07/2008   PROBLEMS WITH HEARING 04/07/2008   Anxiety disorder 02/07/2008   ALLERGIC RHINITIS 02/07/2008   HYPOGLYCEMIA, REACTIVE 11/15/2006   Past Medical History:  Diagnosis Date   Anemia    Anxiety    Depression    Diabetes mellitus without complication (HCC)    type 2   Dyspnea    Hearing loss    wears hearing aids bilateral   History of allergic rhinitis    History of anxiety    History of blood transfusion    [facility] 09/2016 2 units transfused   History of depression    History of hyperlipidemia    History of syncope    Seizures (HCC)    hx seizures as a child,no seizures since age 15 yrs old   Sleep apnea    uses mouth guard at night   Past Surgical History:  Procedure Laterality Date   CESAREAN SECTION  1991, 1995   x 2   CYSTOSCOPY N/A 12/19/2016   Procedure: CYSTOSCOPY;  Surgeon: Cathlyn JAYSON Nikki Bobie FORBES, MD;  Location: [facility];  Service: Gynecology;  Laterality: N/A;   TOTAL LAPAROSCOPIC HYSTERECTOMY WITH SALPINGECTOMY Bilateral 12/19/2016   Procedure:  HYSTERECTOMY TOTAL  LAPAROSCOPIC WITH SALPINGECTOMY;  Surgeon: Cathlyn JAYSON Nikki Bobie FORBES, MD;  Location: [facility];  Service: Gynecology;  Laterality: Bilateral;   WISDOM TOOTH EXTRACTION     Social History[1] Family History  Problem Relation Age of Onset   Diabetes Father    Diabetes Paternal Grandfather    Prostate cancer Paternal Grandfather    Diabetes Paternal Grandmother    Allergies[2] Medications Ordered Prior to Encounter[3]  Review of Systems  Constitutional:  Negative for activity change, appetite change, fatigue, fever and unexpected weight change.       Difficulty losing weight   HENT:  Positive for hearing loss. Negative for congestion, ear pain, rhinorrhea, sinus pressure and sore throat.        Wears hearing aides   Eyes:  Negative for pain, redness and visual disturbance.  Respiratory:  Negative for cough, shortness of breath and wheezing.   Cardiovascular:  Negative for chest pain and palpitations.  Gastrointestinal:  Negative for abdominal pain, blood in stool, constipation and diarrhea.  Endocrine: Negative for polydipsia and polyuria.  Genitourinary:  Negative for dysuria, frequency and urgency.  Musculoskeletal:  Negative for arthralgias, back pain and myalgias.  Skin:  Negative for pallor and rash.  Allergic/Immunologic: Negative for environmental allergies.  Neurological:  Negative for dizziness, syncope and headaches.  Hematological:  Negative for adenopathy. Does not bruise/bleed easily.  Psychiatric/Behavioral:  Negative for decreased concentration and dysphoric mood. The patient is not nervous/anxious.        Objective:   Physical Exam Constitutional:      General: She is not in acute distress.    Appearance: Normal appearance. She is well-developed. She is obese. She is not ill-appearing or diaphoretic.  HENT:     Head: Normocephalic and atraumatic.     Right Ear: Tympanic membrane, ear canal and external ear normal.     Left Ear: Tympanic membrane,  ear canal and external ear normal.     Nose: Nose normal. No congestion.     Mouth/Throat:     Mouth: Mucous membranes are moist.     Pharynx: Oropharynx is clear. No posterior oropharyngeal erythema.  Eyes:     General: No scleral icterus.    Extraocular Movements: Extraocular movements intact.     Conjunctiva/sclera: Conjunctivae normal.     Pupils: Pupils are equal, round, and reactive to light.  Neck:     Thyroid : No thyromegaly.     Vascular: No carotid bruit or JVD.  Cardiovascular:     Rate and Rhythm: Normal rate and regular rhythm.     Pulses: Normal pulses.     Heart sounds: Normal heart sounds.     No gallop.  Pulmonary:     Effort: Pulmonary effort is normal. No respiratory distress.     Breath sounds: Normal breath sounds. No wheezing.     Comments: Good air exch Chest:     Chest wall: No tenderness.  Abdominal:     General: Bowel sounds are normal. There is no distension or abdominal bruit.     Palpations: Abdomen is soft. There is no mass.     Tenderness: There is no abdominal tenderness.     Hernia: No hernia is present.  Genitourinary:    Comments: Breast exam: No mass, nodules, thickening, tenderness, bulging, retraction, inflamation, nipple discharge or skin changes noted.  No axillary or clavicular LA.     Musculoskeletal:        General: No tenderness. Normal range of motion.  Cervical back: Normal range of motion and neck supple. No rigidity. No muscular tenderness.     Right lower leg: No edema.     Left lower leg: No edema.     Comments: No kyphosis   Lymphadenopathy:     Cervical: No cervical adenopathy.  Skin:    General: Skin is warm and dry.     Coloration: Skin is not pale.     Findings: No erythema or rash.     Comments: Solar lentigines diffusely   Neurological:     Mental Status: She is alert. Mental status is at baseline.     Cranial Nerves: No cranial nerve deficit.     Motor: No abnormal muscle tone.     Coordination: Coordination  normal.     Gait: Gait normal.     Deep Tendon Reflexes: Reflexes are normal and symmetric. Reflexes normal.  Psychiatric:        Mood and Affect: Mood normal.        Cognition and Memory: Cognition and memory normal.           Assessment & Plan:   Problem List Items Addressed This Visit       Cardiovascular and Mediastinum   Hypertension   bp in fair control at this time  BP Readings from Last 1 Encounters:  03/31/24 110/68   No changes needed Most recent labs reviewed  Disc lifstyle change with low sodium diet and exercise  Plan to continue metoprolol  xl 50 mg daily        Relevant Medications   metoprolol  succinate (TOPROL -XL) 50 MG 24 hr tablet     Endocrine   PCOS (polycystic ovarian syndrome)   This has made it difficult to loose weight  Now pt is menopausal/ with past hysterectomy      Relevant Orders   Ambulatory referral to Endocrinology   Controlled type 2 diabetes mellitus without complication, without long-term current use of insulin (HCC)   Under endocrinology care Pt has history of hypoglycemia as well as hyperglycemia with complex management  Currently on tirzepatide  10 mg weekly (intol of more than this)   Interested in new endocrinology referral (her current one may be close to retirement) Normal foot exam   Referral made       Relevant Orders   Ambulatory referral to Endocrinology     Other   Vitamin D  deficiency   Last vitamin D  Lab Results  Component Value Date   VD25OH 21.95 (L) 03/21/2024   Urged to increase vitamin D  to 4000 international units daily  Discussed importance to bone and overall health       Vitamin B12 deficiency   Lab Results  Component Value Date   VITAMINB12 749 03/21/2024   Balanced diet On ppi      Routine general medical examination at a health care facility - Primary   Reviewed health habits including diet and exercise and skin cancer prevention Reviewed appropriate screening tests for age   Also reviewed health mt list, fam hx and immunization status , as well as social and family history   See HPI Labs reviewed and ordered Health Maintenance  Topic Date Due   Pneumococcal Vaccine for age over 109 (1 of 2 - PCV) Never done   Hepatitis B Vaccine (1 of 3 - 19+ 3-dose series) Never done   Hemoglobin A1C  06/25/2023   Complete foot exam   02/02/2024   COVID-19 Vaccine (3 - Pfizer risk series) 02/01/2025*  Zoster (Shingles) Vaccine (1 of 2) 06/29/2025*   Yearly kidney health urinalysis for diabetes  01/31/2027*   Breast Cancer Screening  10/17/2024   Eye exam for diabetics  10/21/2024   Yearly kidney function blood test for diabetes  03/21/2025   Colon Cancer Screening  02/10/2030   DTaP/Tdap/Td vaccine (3 - Td or Tdap) 01/31/2032   Hepatitis C Screening  Completed   HIV Screening  Completed   HPV Vaccine  Aged Out   Meningitis B Vaccine  Aged Out  *Topic was postponed. The date shown is not the original due date.    Declines shingrix vaccine  Discussed fall prevention, supplements and exercise for bone density  Will start supplementing vit D Has a regular dermatologist for skin screening/ uses sun protection  PHQ 2 with treatment      Morbid obesity (HCC)   Discussed how this problem influences overall health and the risks it imposes  Reviewed plan for weight loss with lower calorie diet (via better food choices (lower glycemic and portion control) along with exercise building up to or more than 30 minutes 5 days per week including some aerobic activity and strength training   Taking glp-1 at highest tolerated dose  Starting at gym also  Bmi 40.5  Co morbidities of HTN , DM2 and hyperlipidemia Also pcos in past       HYPERTRIGLYCERIDEMIA   Disc goals for lipids and reasons to control them Rev last labs with pt Rev low sat fat diet in detail  LDL of 72 -up from 52 (did recently have vacation however)  Trig 90  Continues Atorvastatin  20 mg daily   Fenofibrate  135 mg daily       Relevant Medications   metoprolol  succinate (TOPROL -XL) 50 MG 24 hr tablet   Current use of proton pump inhibitor   Lab Results  Component Value Date   VITAMINB12 749 03/21/2024   Last vitamin D  Lab Results  Component Value Date   VD25OH 21.95 (L) 03/21/2024   Will work on D supplementation       Anxiety disorder   Doing well with lexapro  10 mg now   PHQ score of 2   Encouraged to continue exercise and self care          [1]  Social History Tobacco Use   Smoking status: Never   Smokeless tobacco: Never  Vaping Use   Vaping status: Never Used  Substance Use Topics   Alcohol use: Yes    Alcohol/week: 0.0 standard drinks of alcohol    Comment: occasional  winie   Drug use: No  [2]  Allergies Allergen Reactions   Haemophilus B Polysaccharide Vaccine Nausea And Vomiting    Vomiting   Influenza Vaccine Recombinant     Vomiting    Metformin And Related     Nausea vomiting   Penicillins Hives    Has patient had a PCN reaction causing immediate rash, facial/tongue/throat swelling, SOB or lightheadedness with hypotension: Yes Has patient had a PCN reaction causing severe rash involving mucus membranes or skin necrosis: No Has patient had a PCN reaction that required hospitalization: No Has patient had a PCN reaction occurring within the last 10 years: No If all of the above answers are NO, then may proceed with Cephalosporin use.    Semaglutide     Took REBYLSUS: Other reaction(s): diarrhea  [3]  Current Outpatient Medications on File Prior to Visit  Medication Sig Dispense Refill   atorvastatin  (LIPITOR) 20 MG tablet  TAKE 1 TABLET BY MOUTH EVERYDAY AT BEDTIME 90 tablet 0   cholecalciferol (VITAMIN D3) 25 MCG (1000 UNIT) tablet Take 4,000 Units by mouth daily.     Choline Fenofibrate  (FENOFIBRIC ACID ) 135 MG CPDR Take 1 capsule by mouth at bedtime. 90 capsule 3   escitalopram  (LEXAPRO ) 10 MG tablet TAKE 1 TABLET BY MOUTH EVERY  DAY 90 tablet 0   glucose blood (ONETOUCH VERIO) test strip Use to check blood sugar once daily as needed 50 strip 3   pantoprazole (PROTONIX) 40 MG tablet Take 1 tablet by mouth daily.     tirzepatide  (MOUNJARO ) 10 MG/0.5ML Pen Inject 10 mg into the skin once a week. 6 mL 3   No current facility-administered medications on file prior to visit.   "

## 2024-03-31 NOTE — Assessment & Plan Note (Signed)
 Lab Results  Component Value Date   VITAMINB12 749 03/21/2024   Balanced diet On ppi

## 2024-03-31 NOTE — Assessment & Plan Note (Signed)
 Disc goals for lipids and reasons to control them Rev last labs with pt Rev low sat fat diet in detail  LDL of 72 -up from 52 (did recently have vacation however)  Trig 90  Continues Atorvastatin  20 mg daily  Fenofibrate  135 mg daily

## 2024-03-31 NOTE — Assessment & Plan Note (Signed)
 bp in fair control at this time  BP Readings from Last 1 Encounters:  03/31/24 110/68   No changes needed Most recent labs reviewed  Disc lifstyle change with low sodium diet and exercise  Plan to continue metoprolol  xl 50 mg daily

## 2024-03-31 NOTE — Assessment & Plan Note (Signed)
 Last vitamin D  Lab Results  Component Value Date   VD25OH 21.95 (L) 03/21/2024   Urged to increase vitamin D  to 4000 international units daily  Discussed importance to bone and overall health

## 2024-03-31 NOTE — Assessment & Plan Note (Signed)
 Doing well with lexapro  10 mg now   PHQ score of 2   Encouraged to continue exercise and self care

## 2024-03-31 NOTE — Patient Instructions (Addendum)
 I put the referral in for endocrinology  Please let us  know if you don't hear in 1-2 weeks to set that up (mychart message or call or letter)    Go up on vitamin D3 to 4000 international units daily for bone and overall   Keep exercising  Especially strength training

## 2024-03-31 NOTE — Assessment & Plan Note (Signed)
 Lab Results  Component Value Date   VITAMINB12 749 03/21/2024   Last vitamin D  Lab Results  Component Value Date   VD25OH 21.95 (L) 03/21/2024   Will work on D supplementation

## 2024-03-31 NOTE — Assessment & Plan Note (Signed)
 Under endocrinology care Pt has history of hypoglycemia as well as hyperglycemia with complex management  Currently on tirzepatide  10 mg weekly (intol of more than this)   Interested in new endocrinology referral (her current one may be close to retirement) Normal foot exam   Referral made

## 2024-03-31 NOTE — Assessment & Plan Note (Signed)
 This has made it difficult to loose weight  Now pt is menopausal/ with past hysterectomy

## 2024-03-31 NOTE — Assessment & Plan Note (Signed)
 Reviewed health habits including diet and exercise and skin cancer prevention Reviewed appropriate screening tests for age  Also reviewed health mt list, fam hx and immunization status , as well as social and family history   See HPI Labs reviewed and ordered Health Maintenance  Topic Date Due   Pneumococcal Vaccine for age over 64 (1 of 2 - PCV) Never done   Hepatitis B Vaccine (1 of 3 - 19+ 3-dose series) Never done   Hemoglobin A1C  06/25/2023   Complete foot exam   02/02/2024   COVID-19 Vaccine (3 - Pfizer risk series) 02/01/2025*   Zoster (Shingles) Vaccine (1 of 2) 06/29/2025*   Yearly kidney health urinalysis for diabetes  01/31/2027*   Breast Cancer Screening  10/17/2024   Eye exam for diabetics  10/21/2024   Yearly kidney function blood test for diabetes  03/21/2025   Colon Cancer Screening  02/10/2030   DTaP/Tdap/Td vaccine (3 - Td or Tdap) 01/31/2032   Hepatitis C Screening  Completed   HIV Screening  Completed   HPV Vaccine  Aged Out   Meningitis B Vaccine  Aged Out  *Topic was postponed. The date shown is not the original due date.    Declines shingrix vaccine  Discussed fall prevention, supplements and exercise for bone density  Will start supplementing vit D Has a regular dermatologist for skin screening/ uses sun protection  PHQ 2 with treatment

## 2024-04-18 ENCOUNTER — Other Ambulatory Visit: Payer: Self-pay | Admitting: Family Medicine

## 2024-04-18 DIAGNOSIS — E781 Pure hyperglyceridemia: Secondary | ICD-10-CM
